# Patient Record
Sex: Female | Born: 1960
Health system: Southern US, Community
[De-identification: ages and names within clinical notes are randomized; demographics above are authoritative.]

## PROBLEM LIST (undated history)

## (undated) DIAGNOSIS — F32A Depression, unspecified: Secondary | ICD-10-CM

## (undated) DIAGNOSIS — Z9889 Other specified postprocedural states: Secondary | ICD-10-CM

## (undated) DIAGNOSIS — K5900 Constipation, unspecified: Secondary | ICD-10-CM

## (undated) DIAGNOSIS — Z8489 Family history of other specified conditions: Secondary | ICD-10-CM

## (undated) DIAGNOSIS — I25119 Atherosclerotic heart disease of native coronary artery with unspecified angina pectoris: Secondary | ICD-10-CM

## (undated) DIAGNOSIS — I6529 Occlusion and stenosis of unspecified carotid artery: Secondary | ICD-10-CM

## (undated) DIAGNOSIS — E7800 Pure hypercholesterolemia, unspecified: Secondary | ICD-10-CM

## (undated) DIAGNOSIS — I2510 Atherosclerotic heart disease of native coronary artery without angina pectoris: Secondary | ICD-10-CM

## (undated) DIAGNOSIS — Z87442 Personal history of urinary calculi: Secondary | ICD-10-CM

## (undated) DIAGNOSIS — R112 Nausea with vomiting, unspecified: Secondary | ICD-10-CM

## (undated) DIAGNOSIS — R319 Hematuria, unspecified: Secondary | ICD-10-CM

## (undated) DIAGNOSIS — G250 Essential tremor: Secondary | ICD-10-CM

## (undated) DIAGNOSIS — T8859XA Other complications of anesthesia, initial encounter: Secondary | ICD-10-CM

## (undated) DIAGNOSIS — F419 Anxiety disorder, unspecified: Secondary | ICD-10-CM

## (undated) DIAGNOSIS — I739 Peripheral vascular disease, unspecified: Secondary | ICD-10-CM

## (undated) DIAGNOSIS — F418 Other specified anxiety disorders: Secondary | ICD-10-CM

## (undated) DIAGNOSIS — R519 Headache, unspecified: Secondary | ICD-10-CM

## (undated) DIAGNOSIS — R634 Abnormal weight loss: Secondary | ICD-10-CM

## (undated) HISTORY — DX: Atherosclerotic heart disease of native coronary artery with unspecified angina pectoris: I25.119

## (undated) HISTORY — DX: Occlusion and stenosis of unspecified carotid artery: I65.29

## (undated) HISTORY — DX: Other specified anxiety disorders: F41.8

## (undated) HISTORY — DX: Constipation, unspecified: K59.00

## (undated) HISTORY — DX: Atherosclerotic heart disease of native coronary artery without angina pectoris: I25.10

## (undated) HISTORY — DX: Abnormal weight loss: R63.4

## (undated) HISTORY — DX: Essential tremor: G25.0

## (undated) HISTORY — PX: TONSILLECTOMY: SUR1361

## (undated) HISTORY — DX: Pure hypercholesterolemia, unspecified: E78.00

## (undated) HISTORY — DX: Hematuria, unspecified: R31.9

## (undated) HISTORY — DX: Anxiety disorder, unspecified: F41.9

## (undated) HISTORY — PX: COLONOSCOPY: SHX174

## (undated) HISTORY — DX: Peripheral vascular disease, unspecified: I73.9

## (undated) HISTORY — PX: OOPHORECTOMY: SHX86

---

## 1990-08-31 HISTORY — PX: TUBAL LIGATION: SHX77

## 2001-09-12 ENCOUNTER — Encounter: Admission: RE | Admit: 2001-09-12 | Discharge: 2001-09-12 | Payer: Self-pay

## 2001-09-28 ENCOUNTER — Encounter: Payer: Self-pay | Admitting: Vascular Surgery

## 2001-09-29 ENCOUNTER — Ambulatory Visit (HOSPITAL_COMMUNITY): Admission: RE | Admit: 2001-09-29 | Discharge: 2001-09-29 | Payer: Self-pay | Admitting: Vascular Surgery

## 2004-08-31 HISTORY — PX: ROTATOR CUFF REPAIR: SHX139

## 2011-01-16 NOTE — Cardiovascular Report (Signed)
[place]  Patient:    Alyssa Ritter, Alyssa Ritter Visit Number: [id number] MRN: [id number]          Service Type: DSU Location: [facility] 2899 [id number] Attending Physician:  Colvin Caroli Dictated by:   Caralee Ates, M.D. Proc. Date: 09/29/01 Admit Date:  09/29/2001 Discharge Date: 09/29/2001   CC:         CVTS Office  Peripheral Vascular Lab, 6th Floor   Cardiac Catheterization  PREOPERATIVE DIAGNOSIS:  Aortoiliac occlusive disease.  POSTOPERATIVE DIAGNOSIS:  Aortoiliac occlusive disease.  PROCEDURES: 1. Aortogram with bilateral lower extremity runoff. 2. Selective right iliac imaging. 3. Right common iliac PTA and stent placement (Genesis 6 x 18).  SURGEON:  Caralee Ates, M.D.  ANESTHESIA:  Fentanyl 100 mcg IV, versed 2 mg IV, and 1% lidocaine as local.  ACCESS:  Right common femoral artery (5-French sheath converted to 6-French sheath for peripheral intervention).  TOTAL FLUOROSCOPY TIME:  13 minutes, 32 seconds.  TOTAL CONTRAST:  235 cc of Visipaque.  BRIEF HISTORY:  This is a 50 year old white female with a history of peripheral vascular disease and a strong family history for peripheral artery occlusive disease who was evaluated by Dr. Hart Rochester for ongoing hip, thigh, and buttock claudication. She was found to have diminished palpable pulse particularly on the right side and appeared to have significant claudication symptoms. She was scheduled for an arteriogram with possible intervention.  DESCRIPTION OF PROCEDURE:  The patient was brought to the cath lab and placed on the cath lab table in supine position. Following adequate IV sedation, the groins were prepped and draped in a sterile fashion. The right common femoral artery was percutaneously punctured, following local anesthesia with 1% lidocaine. An .035 guidewire was passed into the infrarenal aorta. A 5-French sheath was placed. A pigtail catheter was then advanced into the aorta,  and serial images of the aorta were performed. Next, the catheter was pulled down to just above the bifurcation of the aorta, and runoff images of the lower extremities were obtained serially. Next, the pigtail catheter was removed over a guidewire, and the 5-French right common femoral sheath was changed to a 6-French sheath. A retrograde injection of contrast was performed from this side demonstrating a focal proximal right common iliac artery stenosis. A radiopaque ruler was placed on the anterior abdomen. The lesion was marked, and then a Genesis (6 x 18) stent was positioned at the level of the iliac stenosis. The stent was deployed without difficulty. Following deployment of the stent, a 2nd retrograde injection contrast was performed which demonstrated a widely patent iliac stent. Following this, the deployment apparatus was removed over a guidewire, then the guidewire and sheath were removed. Direct pressure was held at the puncture site until hemostasis was achieved.  ANGIOGRAPHIC FINDINGS: 1. A very small caliber infrarenal aorta with 2 areas of focal    shelf-like plaque, 1 just below the renals and 1 just above the    aortic bifurcation. There were single renal arteries bilaterally    with the right renal artery originating more inferiorly than    the left. Pressure gradient measurements performed in the infrarenal    aorta demonstrated an initial pressure gradient of approximately    20 mmHg which resolved in the distal aorta. 2. The right common iliac artery has a proximal 70% stenosis. The    remainder of the iliac arteries bilaterally were normal as were    the SFA profunda and all tibial vessels.  There was 3-vessel runoff    to the level of the foot. 3. Following deployment of the stent on the right common iliac, the    stent is widely patent. There was excellent flow across the stent.  RECOMMENDATIONS: 1. Routine post-catheterization care. 2. Plavix 75 mg p.o. q.d.  x1 month and then aspirin 81 mg p.o. q.d. 3. Will have the patient follow up in my office for ongoing    surveillance and determine if she has ongoing symptoms despite    our intervention. At some point, due to the small caliber of her    aorta and her family history of severe peripheral artery occlusive    disease as well as her ongoing smoking, she is at risk for distal    aortic occlusion which she should be followed for. Dictated by:   Caralee Ates, M.D. Attending Physician:  Colvin Caroli DD:  09/29/01 TD:  09/29/01 Job: [id number]

## 2020-07-29 DIAGNOSIS — E7800 Pure hypercholesterolemia, unspecified: Secondary | ICD-10-CM | POA: Insufficient documentation

## 2020-07-29 DIAGNOSIS — F419 Anxiety disorder, unspecified: Secondary | ICD-10-CM | POA: Insufficient documentation

## 2020-07-29 DIAGNOSIS — I739 Peripheral vascular disease, unspecified: Secondary | ICD-10-CM | POA: Insufficient documentation

## 2020-07-29 DIAGNOSIS — F32A Depression, unspecified: Secondary | ICD-10-CM | POA: Insufficient documentation

## 2020-07-29 DIAGNOSIS — G250 Essential tremor: Secondary | ICD-10-CM | POA: Insufficient documentation

## 2020-07-29 DIAGNOSIS — I25119 Atherosclerotic heart disease of native coronary artery with unspecified angina pectoris: Secondary | ICD-10-CM | POA: Insufficient documentation

## 2020-07-29 DIAGNOSIS — F418 Other specified anxiety disorders: Secondary | ICD-10-CM | POA: Insufficient documentation

## 2020-07-29 DIAGNOSIS — K5900 Constipation, unspecified: Secondary | ICD-10-CM | POA: Insufficient documentation

## 2020-07-29 DIAGNOSIS — R634 Abnormal weight loss: Secondary | ICD-10-CM | POA: Insufficient documentation

## 2020-07-29 DIAGNOSIS — R319 Hematuria, unspecified: Secondary | ICD-10-CM | POA: Insufficient documentation

## 2020-08-01 ENCOUNTER — Ambulatory Visit: Payer: 59 | Admitting: Cardiology

## 2020-08-01 ENCOUNTER — Telehealth: Payer: Self-pay | Admitting: Cardiology

## 2020-08-01 ENCOUNTER — Encounter: Payer: Self-pay | Admitting: Cardiology

## 2020-08-01 ENCOUNTER — Other Ambulatory Visit: Payer: Self-pay

## 2020-08-01 VITALS — BP 140/72 | HR 62 | Ht 63.0 in | Wt 145.0 lb

## 2020-08-01 DIAGNOSIS — I739 Peripheral vascular disease, unspecified: Secondary | ICD-10-CM

## 2020-08-01 DIAGNOSIS — R079 Chest pain, unspecified: Secondary | ICD-10-CM | POA: Diagnosis not present

## 2020-08-01 DIAGNOSIS — E7800 Pure hypercholesterolemia, unspecified: Secondary | ICD-10-CM

## 2020-08-01 DIAGNOSIS — I25118 Atherosclerotic heart disease of native coronary artery with other forms of angina pectoris: Secondary | ICD-10-CM | POA: Diagnosis not present

## 2020-08-01 LAB — BASIC METABOLIC PANEL
BUN/Creatinine Ratio: 11 (ref 9–23)
BUN: 10 mg/dL (ref 6–24)
CO2: 22 mmol/L (ref 20–29)
Calcium: 8.9 mg/dL (ref 8.7–10.2)
Chloride: 104 mmol/L (ref 96–106)
Creatinine, Ser: 0.91 mg/dL (ref 0.57–1.00)
GFR calc Af Amer: 81 mL/min/{1.73_m2} (ref >59)
GFR calc non Af Amer: 70 mL/min/{1.73_m2} (ref >59)
Glucose: 87 mg/dL (ref 65–99)
Potassium: 4 mmol/L (ref 3.5–5.2)
Sodium: 141 mmol/L (ref 134–144)

## 2020-08-01 MED ORDER — METOPROLOL TARTRATE 100 MG PO TABS: 100.0000 mg | ORAL_TABLET | Freq: Once | ORAL | 0 refills | Status: DC

## 2020-08-01 NOTE — Telephone Encounter (Signed)
Patient is returning call to schedule appointment with the lipid clinic

## 2020-08-01 NOTE — Progress Notes (Signed)
Cardiology Office Note:    Date:  08/01/2020   ID:  [name], [place] 08-09-1961, MRN [id number]  PCP:  Lonie Peak, PA-C  Cardiologist:  Norman Herrlich, MD   Referring MD: Lonie Peak, PA-C  ASSESSMENT:    1. Coronary artery disease of native artery of native heart with stable angina pectoris (HCC)   2. Pure hypercholesterolemia   3. Peripheral vascular disease of extremity (HCC)    PLAN:    In order of problems listed above:   1. She has a history of mild CAD and variable angina.  The recent episode was disturbing occurred at rest was prolonged and with her severe dyslipidemia is at risk for progression we decided to do facilitate a cardiac CTA.  From.  I did not put her on maintenance beta-blocker with relative bradycardia heart rate 62 bpm.  If flow-limiting stenosis would benefit from revascularization percutaneous or surgical. 2. Initiate lipid-lowering therapy PCSK9 inhibitor she is intolerant of all statins has familial hyperlipidemia 3. Stable after PCI remotely  Next appointment 6 weeks   Medication Adjustments/Labs and Tests Ordered: Current medicines are reviewed at length with the patient today.  Concerns regarding medicines are outlined above.  No orders of the defined types were placed in this encounter.  No orders of the defined types were placed in this encounter.    Chief Complaint  Patient presents with  . Coronary Artery Disease    History of Present Illness:    Alyssa Ritter is a 59 y.o. female who is being seen today to establish cardiology care at the request of Lonie Peak, [place]. Epic reveals that I had seen her 11/07/2013 her problems include hyperlipidemia with statin intolerance angina pectoris and coronary vasospasm.  She had coronary angiography performed with mild nonobstructive right coronary artery stenosis 2005 there is a notation she has a history of CAD and had a normal stress echo in 2014 and has peripheral vascular  disease with previous right iliac artery PCI and stent.  Recent labs from her PCP office: Cholesterol 317 LDL 236 triglycerides 125 HDL 58 CMP with GFR 82 cc creatinine 0.9 potassium 5.0 sodium 139 normal liver function test hemoglobin 14.7  There is an angiogram report from 09/29/2001 the patient had aortography and bilateral lower extremity runoff.  At that time  had right common iliac ETA and stent placement.  We both remember each other.  Rarely does she have chest pain.  About a month ago [place] the beach is a very peaceful day sitting resting and she had the onset of typical angina substernal pressure shortness of breath did not resolve with rest she had a difficult time getting to her car and when she got back to the home after aspirin and nitroglycerin had relief in 10 or 15 minutes.  Last episode like this was a year ago.  In general she is done well she does not have claudication and does not have exertional chest pain shortness of breath palpitation or syncope. Past Medical History:  Diagnosis Date  . Anxiety disorder   . Benign essential tremor   . CAD (coronary artery disease)   . Constipation   . Depression with anxiety   . Hematuria   . Hypercholesteremia   . Peripheral vascular disease of extremity (HCC)   . Weight loss, abnormal     Past Surgical History:  Procedure Laterality Date  . CESAREAN SECTION    . OOPHORECTOMY Bilateral    Right cyst  . ROTATOR  CUFF REPAIR Left 2006    Current Medications: Current Meds  Medication Sig  . ASPIRIN 81 PO Take 1 tablet by mouth daily.  . clonazePAM (KLONOPIN) 1 MG tablet Take 0.5-1 mg by mouth 2 (two) times daily as needed.  Marland Kitchen escitalopram (LEXAPRO) 20 MG tablet Take 20 mg by mouth daily.  . nitroGLYCERIN (NITROSTAT) 0.4 MG SL tablet Place 0.4 mg under the tongue every 5 (five) minutes as needed for chest pain.  . traZODone (DESYREL) 50 MG tablet Take 50 mg by mouth at bedtime.     Allergies:   Penicillins   Social  History   Socioeconomic History  . Marital status: Married    Spouse name: Not on file  . Number of children: Not on file  . Years of education: Not on file  . Highest education level: Not on file  Occupational History  . Not on file  Tobacco Use  . Smoking status: Former Smoker    Types: Cigarettes    Quit date: 01/2018    Years since quitting: 2.5  . Smokeless tobacco: Current User  Vaping Use  . Vaping Use: Former  . Quit date: 01/29/2018  Substance and Sexual Activity  . Alcohol use: Yes    Comment: occasionally  . Drug use: Never  . Sexual activity: Not on file  Other Topics Concern  . Not on file  Social History Narrative  . Not on file   Social Determinants of Health   Financial Resource Strain:   . Difficulty of Paying Living Expenses: Not on file  Food Insecurity:   . Worried About Programme researcher, broadcasting/film/video in the Last Year: Not on file  . Ran Out of Food in the Last Year: Not on file  Transportation Needs:   . Lack of Transportation (Medical): Not on file  . Lack of Transportation (Non-Medical): Not on file  Physical Activity:   . Days of Exercise per Week: Not on file  . Minutes of Exercise per Session: Not on file  Stress:   . Feeling of Stress : Not on file  Social Connections:   . Frequency of Communication with Friends and Family: Not on file  . Frequency of Social Gatherings with Friends and Family: Not on file  . Attends Religious Services: Not on file  . Active Member of Clubs or Organizations: Not on file  . Attends Banker Meetings: Not on file  . Marital Status: Not on file     Family History: The patient's family history includes Arthritis in her mother and sister; CAD in her father; Colon polyps in her mother; Hypercholesterolemia in her father and mother; Hypertension in her brother, father, and mother; Seizures in her mother; Stroke in her brother; Thyroid disease in her sister. There is no history of Colon cancer or Breast  cancer.  ROS:   ROS Please see the history of present illness.     All other systems reviewed and are negative.  EKGs/Labs/Other Studies Reviewed:    The following studies were reviewed today:   EKG:  EKG is sinus rhythm and is normal ordered today.  The ekg ordered today is personally reviewed and demonstrates sinus rhythm and normal   Physical Exam:    VS:  BP 140/72 (BP Location: Left Arm)   Pulse 62   Ht 5\' 3"  (1.6 m)   Wt 145 lb (65.8 kg)   SpO2 98%   BMI 25.69 kg/m     Wt Readings from Last 3  Encounters:  08/01/20 145 lb (65.8 kg)     GEN:  Well nourished, well developed in no acute distress there is no xanthoma or xanthelasma HEENT: Normal NECK: No JVD; No carotid bruits LYMPHATICS: No lymphadenopathy CARDIAC: RRR, no murmurs, rubs, gallops RESPIRATORY:  Clear to auscultation without rales, wheezing or rhonchi  ABDOMEN: Soft, non-tender, non-distended MUSCULOSKELETAL:  No edema; No deformity  SKIN: Warm and dry NEUROLOGIC:  Alert and oriented x 3 PSYCHIATRIC:  Normal affect     Signed, Norman Herrlich, MD  08/01/2020 10:12 AM    [facility]

## 2020-08-01 NOTE — Patient Instructions (Addendum)
Medication Instructions:  Your physician recommends that you continue on your current medications as directed. Please refer to the Current Medication list given to you today.  *If you need a refill on your cardiac medications before your next appointment, please call your pharmacy*   Lab Work: Your physician recommends that you return for lab work in: TODAY BMP If you have labs (blood work) drawn today and your tests are completely normal, you will receive your results only by: Marland Kitchen MyChart Message (if you have MyChart) OR . A paper copy in the mail If you have any lab test that is abnormal or we need to change your treatment, we will call you to review the results.   Testing/Procedures: Your cardiac CT will be scheduled at the below location:   [facility] [address] [place], [place] [address] [phone]   If scheduled at [facility], please arrive at the [facility] main entrance of [facility] 30 minutes prior to test start time. Proceed to the [facility] Radiology Department (first floor) to check-in and test prep.   Please follow these instructions carefully (unless otherwise directed):   On the Night Before the Test: . Be sure to Drink plenty of water. . Do not consume any caffeinated/decaffeinated beverages or chocolate 12 hours prior to your test. . Do not take any antihistamines 12 hours prior to your test.  On the Day of the Test: . Drink plenty of water. Do not drink any water within one hour of the test. . Do not eat any food 4 hours prior to the test. . You may take your regular medications prior to the test.  . Take metoprolol (Lopressor) two hours prior to test. . FEMALES- please wear underwire-free bra if available       After the Test: . Drink plenty of water. . After receiving IV contrast, you may experience a mild flushed feeling. This is normal. . On occasion, you may experience a mild rash up to 24 hours after the  test. This is not dangerous. If this occurs, you can take Benadryl 25 mg and increase your fluid intake. . If you experience trouble breathing, this can be serious. If it is severe call 911 IMMEDIATELY. If it is mild, please call our office. . If you take any of these medications: Glipizide/Metformin, Avandament, Glucavance, please do not take 48 hours after completing test unless otherwise instructed.   Once we have confirmed authorization from your insurance company, we will call you to set up a date and time for your test. Based on how quickly your insurance processes prior authorizations requests, please allow up to 4 weeks to be contacted for scheduling your Cardiac CT appointment. Be advised that routine Cardiac CT appointments could be scheduled as many as 8 weeks after your provider has ordered it.  For non-scheduling related questions, please contact the cardiac imaging nurse navigator should you have any questions/concerns: Marchia Bond, Cardiac Imaging Nurse Navigator Burley Saver, Interim Cardiac Imaging Nurse [place] [facility] Direct Office Dial: [phone]   For scheduling needs, including cancellations and rescheduling, please call [place], [phone].     Follow-Up: At [facility], you and your health needs are our priority.  As part of our continuing mission to provide you with exceptional heart care, we have created designated Provider Care Teams.  These Care Teams include your primary Cardiologist (physician) and Advanced Practice Providers (APPs -  Physician Assistants and Nurse Practitioners) who  all work together to provide you with the care you need, when you need it.  We recommend signing up for the patient portal called "MyChart".  Sign up information is provided on this After Visit Summary.  MyChart is used to connect with patients for Virtual Visits (Telemedicine).  Patients are able to view lab/test results, encounter notes,  upcoming appointments, etc.  Non-urgent messages can be sent to your provider as well.   To learn more about what you can do with MyChart, go to [email or web address].    Your next appointment:   6 week(s)  The format for your next appointment:   In Person  Provider:   Shirlee More, MD   Other Instructions

## 2020-08-01 NOTE — Telephone Encounter (Signed)
Patient called back to talk about the shot Dr. Dulce Sellar wants to do for chlorestorol. Please call back

## 2020-08-02 ENCOUNTER — Telehealth: Payer: Self-pay

## 2020-08-02 NOTE — Telephone Encounter (Signed)
Spoke with patient regarding results and recommendation.  Patient verbalizes understanding and is agreeable to plan of care. Advised patient to call back with any issues or concerns.  

## 2020-08-02 NOTE — Telephone Encounter (Signed)
-----   Message from Baldo Daub, MD sent at 08/02/2020 12:37 PM EST ----- Normal result for cardiac CTA

## 2020-08-06 NOTE — Patient Instructions (Addendum)
It was great meeting you today!  We would like to lower your LDL (bad cholesterol) to less than 70  We are going to start you on a medication called Repatha, which is an injection you will give yourself once every 2 weeks  We would like to recheck your cholesterol levels in about 2-3 months  Laural Golden, PharmD, BCACP, CDCES, CPP [facility] [address], [place], [place] [address] Phone: [phone]; Fax: [phone] 08/07/2020 10:45 AM

## 2020-08-07 ENCOUNTER — Other Ambulatory Visit: Payer: Self-pay

## 2020-08-07 ENCOUNTER — Encounter: Payer: Self-pay | Admitting: Pharmacist

## 2020-08-07 ENCOUNTER — Telehealth: Payer: Self-pay | Admitting: Pharmacist

## 2020-08-07 ENCOUNTER — Ambulatory Visit (INDEPENDENT_AMBULATORY_CARE_PROVIDER_SITE_OTHER): Payer: 59 | Admitting: Pharmacist

## 2020-08-07 DIAGNOSIS — I2510 Atherosclerotic heart disease of native coronary artery without angina pectoris: Secondary | ICD-10-CM | POA: Diagnosis not present

## 2020-08-07 DIAGNOSIS — E7800 Pure hypercholesterolemia, unspecified: Secondary | ICD-10-CM | POA: Diagnosis not present

## 2020-08-07 MED ORDER — EVOLOCUMAB 140 MG/ML ~~LOC~~ SOAJ: 1.0000 mL | SUBCUTANEOUS | 1 refills | Status: DC

## 2020-08-07 NOTE — Telephone Encounter (Signed)
PA for Repatha approved through 08/07/2021.  Called patient and she is aware

## 2020-08-07 NOTE — Progress Notes (Signed)
Patient ID: Alyssa Ritter                 DOB: 02/25/1961                    MRN: [id number]     HPI: Alyssa Ritter is a 59 y.o. female patient referred to lipid clinic by Dr Dulce Sellar. PMH is significant for PVD, CAD, angina and HLD.  Seen By Dr Dulce Sellar on 08/01/20 after PCP referral due to chest pain at the beach while at rest.  SOB and angina did not resolve until she had aspirin and nitrogluycerin.  Intolerant to statins due to renal issues.  Patient presents to day in good spirits.  Reports she had her cholesterol controlled ~10 years ago with diet and exercise.  Lives with husband and 2 grandchildren, age 110 and 6.  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  Had to homeschool them and cook meals during Covid and was no longer physically active which is why she believes her cholesterol has increased.  Current Medications: n/a Intolerances: zocor, crestor, lipitor Risk Factors: CAD, HLD LDL goal: <70  Exercise: Walks, although says that since the pandemic and having to home school her grandchildren, she was much less active in the past year  Labs: TC 317, HDL 58, LDL 236, Trigs 125 (07/09/20 - not on any meds)  Past Medical History:  Diagnosis Date  . Anxiety disorder   . Benign essential tremor   . CAD (coronary artery disease)   . Constipation   . Depression with anxiety   . Hematuria   . Hypercholesteremia   . Peripheral vascular disease of extremity (HCC)   . Weight loss, abnormal     Current Outpatient Medications on File Prior to Visit  Medication Sig Dispense Refill  . ASPIRIN 81 PO Take 1 tablet by mouth daily.    . clonazePAM (KLONOPIN) 1 MG tablet Take 0.5-1 mg by mouth 2 (two) times daily as needed.    Marland Kitchen escitalopram (LEXAPRO) 20 MG tablet Take 20 mg by mouth daily.    . metoprolol tartrate (LOPRESSOR) 100 MG tablet Take 1 tablet (100 mg total) by mouth once for 1 dose. Take two hours prior to your CT 1 tablet 0  . nitroGLYCERIN (NITROSTAT) 0.4 MG SL  tablet Place 0.4 mg under the tongue every 5 (five) minutes as needed for chest pain.    . traZODone (DESYREL) 50 MG tablet Take 50 mg by mouth at bedtime.     No current facility-administered medications on file prior to visit.    Allergies  Allergen Reactions  . Penicillins Anaphylaxis    Assessment/Plan:  1. Hyperlipidemia - Patients calculated LDL per lab 236 which is above goal of <70.  Since patient is intolerant of statins due to kidney injury, next step for LDL reduction would be a PCSK9i.  Patient is willing to try.  Using [employer], educated patient on storage, site selection and administration.  Patient was able to demonstrate use of demo pen in room.  Will complete PA and set up patient for lipid panel in 2-3 months.  Patient voiced understanding.  Recheck as needed.  Start Repatha 140mg  SQ q 14 d  , PharmD, BCACP, CDCES, CPP [facility] [address], [place], [place] Phone: [phone]; Fax: [phone] 08/07/2020 1:28 PM

## 2020-08-21 ENCOUNTER — Telehealth (HOSPITAL_COMMUNITY): Payer: Self-pay | Admitting: *Deleted

## 2020-08-21 NOTE — Telephone Encounter (Signed)
Reaching out to patient to offer assistance regarding upcoming cardiac imaging study; pt verbalizes understanding of appt date/time, parking situation and where to check in, pre-test NPO status and medications ordered, and verified current allergies; name and call back number provided for further questions should they arise ° °Aariona Momon Tai RN Navigator Cardiac Imaging °[facility] °[phone] office °[phone] cell ° °

## 2020-08-26 ENCOUNTER — Telehealth: Payer: Self-pay

## 2020-08-26 ENCOUNTER — Other Ambulatory Visit: Payer: Self-pay

## 2020-08-26 ENCOUNTER — Ambulatory Visit (HOSPITAL_COMMUNITY)
Admission: RE | Admit: 2020-08-26 | Discharge: 2020-08-26 | Disposition: A | Discharge status: Home / Self Care | Payer: 59 | Source: Ambulatory Visit | Attending: Cardiology | Admitting: Cardiology

## 2020-08-26 DIAGNOSIS — I2510 Atherosclerotic heart disease of native coronary artery without angina pectoris: Secondary | ICD-10-CM | POA: Diagnosis not present

## 2020-08-26 DIAGNOSIS — R079 Chest pain, unspecified: Secondary | ICD-10-CM | POA: Diagnosis not present

## 2020-08-26 MED ORDER — NITROGLYCERIN 0.4 MG SL SUBL
0.8000 mg | SUBLINGUAL_TABLET | Freq: Once | SUBLINGUAL | Status: AC
  Administered 2020-08-26: 0.8 mg via SUBLINGUAL

## 2020-08-26 MED ORDER — NITROGLYCERIN 0.4 MG SL SUBL
SUBLINGUAL_TABLET | SUBLINGUAL | Status: AC
  Filled 2020-08-26: qty 2

## 2020-08-26 MED ORDER — IOHEXOL 350 MG/ML SOLN
80.0000 mL | Freq: Once | INTRAVENOUS | Status: AC | PRN
  Administered 2020-08-26: 80 mL via INTRAVENOUS

## 2020-08-26 NOTE — Telephone Encounter (Signed)
Spoke with patient regarding results and recommendation.  Patient verbalizes understanding and is agreeable to plan of care. Advised patient to call back with any issues or concerns.  

## 2020-08-28 DIAGNOSIS — I2510 Atherosclerotic heart disease of native coronary artery without angina pectoris: Secondary | ICD-10-CM | POA: Diagnosis not present

## 2020-08-30 NOTE — Progress Notes (Signed)
Cardiology Office Note:    Date:  09/02/2020   ID:  Minetta, Krisher [birth year], MRN [id number]  PCP:  Lonie Peak, PA-C  Cardiologist:  Norman Herrlich, MD    Referring MD: Lonie Peak, PA-C    ASSESSMENT:    1. Coronary artery disease involving native heart, unspecified vessel or lesion type, unspecified whether angina present   2. Hypercholesteremia   3. Peripheral vascular disease of extremity (HCC)    PLAN:    In order of problems listed above:  1. Progressive coronary disease flow-limiting stenosis right coronary artery and at least moderate left main stenosis undergo coronary angiography hopefully percutaneous intervention.  She will continue medical therapy including aspirin beta-blocker and lipid-lowering with Repatha. 2. Stable PAD   Next appointment: 6 weeks   Medication Adjustments/Labs and Tests Ordered: Current medicines are reviewed at length with the patient today.  Concerns regarding medicines are outlined above.  Orders Placed This Encounter  Procedures  . Basic metabolic panel  . CBC  . EKG 12-Lead   No orders of the defined types were placed in this encounter.   No chief complaint on file.   History of Present Illness:    Alyssa Ritter is a 59 y.o. female with a hx of hyperlipidemia with statin intolerance history of angina felt to be due to coronary vasospasm with previous angiography in 2005 showing mild nonobstructive right coronary artery stenosis.  She was last seen 08/01/2020 change in her anginal pattern with a prolonged episode of nocturnal chest pain..  She also has a history of peripheral arterial disease with previous right CIA stent.  Her baseline LDL severely elevated 236 consistent with familial hyperlipidemia she is statin intolerant and was referred to lipid clinic and initiated on PCSK9 therapy with Repatha.  Compliance with diet, lifestyle and medications: Yes  Since her last visit she has done well tolerates  Repatha has had no further chest pain.  Reviewed the results of her cardiac CTA showing significant stenosis of right coronary artery and at least moderate left main coronary stenosis reviewed the benefits risk options and she opts to undergo coronary angiography.  Cardiac CTA reported 08/01/2020 shows a calcium score of 278 which was 98th percentile for age and sex matched control.  She had mild to moderate atherosclerosis the left main coronary artery was not adequately visualized due to motion with report of at least 25 to 49% stenosis.  The LAD stenosis 25 to 49% mid vessel and first diagonal branch.  Left circumflex 1 to 24% stenosis obtuse marginal 1 and right coronary artery showed stenosis in the proximal and distal right coronary artery as high as 50 to 69% stenosis in the mid vessel.  Squint fractional flow reserve was normal involving the left main 0.89 LAD 0.87 left circumflex 0.86 however managed in the mid right coronary artery 0.68 consistent with significant stenosis.  Past Medical History:  Diagnosis Date  . Anxiety disorder   . Benign essential tremor   . CAD (coronary artery disease)   . Constipation   . Depression with anxiety   . Hematuria   . Hypercholesteremia   . Peripheral vascular disease of extremity (HCC)   . Weight loss, abnormal     Past Surgical History:  Procedure Laterality Date  . CESAREAN SECTION    . OOPHORECTOMY Bilateral    Right cyst  . ROTATOR CUFF REPAIR Left 2006    Current Medications: Current Meds  Medication Sig  . ASPIRIN 81 PO Take  1 tablet by mouth daily.  . clonazePAM (KLONOPIN) 1 MG tablet Take 0.5-1 mg by mouth 2 (two) times daily as needed.  Marland Kitchen escitalopram (LEXAPRO) 20 MG tablet Take 20 mg by mouth daily.  . Evolocumab 140 MG/ML SOAJ Inject 1 mL into the skin every 14 (fourteen) days.  . nitroGLYCERIN (NITROSTAT) 0.4 MG SL tablet Place 0.4 mg under the tongue every 5 (five) minutes as needed for chest pain.  . traZODone (DESYREL) 50  MG tablet Take 50 mg by mouth at bedtime.     Allergies:   Penicillins and Statins   Social History   Socioeconomic History  . Marital status: Married    Spouse name: Not on file  . Number of children: Not on file  . Years of education: Not on file  . Highest education level: Not on file  Occupational History  . Not on file  Tobacco Use  . Smoking status: Former Smoker    Types: Cigarettes    Quit date: 01/2018    Years since quitting: 2.5  . Smokeless tobacco: Current User  Vaping Use  . Vaping Use: Former  . Quit date: 01/29/2018  Substance and Sexual Activity  . Alcohol use: Yes    Comment: occasionally  . Drug use: Never  . Sexual activity: Not on file  Other Topics Concern  . Not on file  Social History Narrative  . Not on file   Social Determinants of Health   Financial Resource Strain: Not on file  Food Insecurity: Not on file  Transportation Needs: Not on file  Physical Activity: Not on file  Stress: Not on file  Social Connections: Not on file     Family History: The patient's family history includes Arthritis in her mother and sister; CAD in her father; Colon polyps in her mother; Hypercholesterolemia in her father and mother; Hypertension in her brother, father, and mother; Seizures in her mother; Stroke in her brother; Thyroid disease in her sister. There is no history of Colon cancer or Breast cancer. ROS:   Please see the history of present illness.    All other systems reviewed and are negative.  EKGs/Labs/Other Studies Reviewed:    The following studies were reviewed today:  Recent Labs: 08/01/2020: BUN 10; Creatinine, Ser 0.91; Potassium 4.0; Sodium 141  Recent Lipid Panel No results found for: CHOL, TRIG, HDL, CHOLHDL, VLDL, LDLCALC, LDLDIRECT  Physical Exam:    VS:  BP (!) 146/74   Pulse 60   Ht 5\' 3"  (1.6 m)   Wt 141 lb 1.3 oz (64 kg)   SpO2 98%   BMI 24.99 kg/m     Wt Readings from Last 3 Encounters:  09/02/20 141 lb 1.3 oz (64  kg)  08/01/20 145 lb (65.8 kg)     GEN:  Well nourished, well developed in no acute distress HEENT: Normal NECK: No JVD; No carotid bruits LYMPHATICS: No lymphadenopathy CARDIAC: RRR, no murmurs, rubs, gallops RESPIRATORY:  Clear to auscultation without rales, wheezing or rhonchi  ABDOMEN: Soft, non-tender, non-distended MUSCULOSKELETAL:  No edema; No deformity  SKIN: Warm and dry NEUROLOGIC:  Alert and oriented x 3 PSYCHIATRIC:  Normal affect    Signed, 14/02/21, MD  09/02/2020 2:52 PM    [facility]

## 2020-08-30 NOTE — H&P (View-Only) (Signed)
Cardiology Office Note:    Date:  09/02/2020   ID:  Alyssa Ritter, Alyssa Ritter [birth year], MRN [id number]  PCP:  Lonie Peak, PA-C  Cardiologist:  Norman Herrlich, MD    Referring MD: Lonie Peak, PA-C    ASSESSMENT:    1. Coronary artery disease involving native heart, unspecified vessel or lesion type, unspecified whether angina present   2. Hypercholesteremia   3. Peripheral vascular disease of extremity (HCC)    PLAN:    In order of problems listed above:  1. Progressive coronary disease flow-limiting stenosis right coronary artery and at least moderate left main stenosis undergo coronary angiography hopefully percutaneous intervention.  She will continue medical therapy including aspirin beta-blocker and lipid-lowering with Repatha. 2. Stable PAD   Next appointment: 6 weeks   Medication Adjustments/Labs and Tests Ordered: Current medicines are reviewed at length with the patient today.  Concerns regarding medicines are outlined above.  Orders Placed This Encounter  Procedures  . Basic metabolic panel  . CBC  . EKG 12-Lead   No orders of the defined types were placed in this encounter.   No chief complaint on file.   History of Present Illness:    Alyssa Ritter is a 59 y.o. female with a hx of hyperlipidemia with statin intolerance history of angina felt to be due to coronary vasospasm with previous angiography in 2005 showing mild nonobstructive right coronary artery stenosis.  She was last seen 08/01/2020 change in her anginal pattern with a prolonged episode of nocturnal chest pain..  She also has a history of peripheral arterial disease with previous right CIA stent.  Her baseline LDL severely elevated 236 consistent with familial hyperlipidemia she is statin intolerant and was referred to lipid clinic and initiated on PCSK9 therapy with Repatha.  Compliance with diet, lifestyle and medications: Yes  Since her last visit she has done well tolerates  Repatha has had no further chest pain.  Reviewed the results of her cardiac CTA showing significant stenosis of right coronary artery and at least moderate left main coronary stenosis reviewed the benefits risk options and she opts to undergo coronary angiography.  Cardiac CTA reported 08/01/2020 shows a calcium score of 278 which was 98th percentile for age and sex matched control.  She had mild to moderate atherosclerosis the left main coronary artery was not adequately visualized due to motion with report of at least 25 to 49% stenosis.  The LAD stenosis 25 to 49% mid vessel and first diagonal branch.  Left circumflex 1 to 24% stenosis obtuse marginal 1 and right coronary artery showed stenosis in the proximal and distal right coronary artery as high as 50 to 69% stenosis in the mid vessel.  Squint fractional flow reserve was normal involving the left main 0.89 LAD 0.87 left circumflex 0.86 however managed in the mid right coronary artery 0.68 consistent with significant stenosis.  Past Medical History:  Diagnosis Date  . Anxiety disorder   . Benign essential tremor   . CAD (coronary artery disease)   . Constipation   . Depression with anxiety   . Hematuria   . Hypercholesteremia   . Peripheral vascular disease of extremity (HCC)   . Weight loss, abnormal     Past Surgical History:  Procedure Laterality Date  . CESAREAN SECTION    . OOPHORECTOMY Bilateral    Right cyst  . ROTATOR CUFF REPAIR Left 2006    Current Medications: Current Meds  Medication Sig  . ASPIRIN 81 PO Take  1 tablet by mouth daily.  . clonazePAM (KLONOPIN) 1 MG tablet Take 0.5-1 mg by mouth 2 (two) times daily as needed.  . escitalopram (LEXAPRO) 20 MG tablet Take 20 mg by mouth daily.  . Evolocumab 140 MG/ML SOAJ Inject 1 mL into the skin every 14 (fourteen) days.  . nitroGLYCERIN (NITROSTAT) 0.4 MG SL tablet Place 0.4 mg under the tongue every 5 (five) minutes as needed for chest pain.  . traZODone (DESYREL) 50  MG tablet Take 50 mg by mouth at bedtime.     Allergies:   Penicillins and Statins   Social History   Socioeconomic History  . Marital status: Married    Spouse name: Not on file  . Number of children: Not on file  . Years of education: Not on file  . Highest education level: Not on file  Occupational History  . Not on file  Tobacco Use  . Smoking status: Former Smoker    Types: Cigarettes    Quit date: 01/2018    Years since quitting: 2.5  . Smokeless tobacco: Current User  Vaping Use  . Vaping Use: Former  . Quit date: 01/29/2018  Substance and Sexual Activity  . Alcohol use: Yes    Comment: occasionally  . Drug use: Never  . Sexual activity: Not on file  Other Topics Concern  . Not on file  Social History Narrative  . Not on file   Social Determinants of Health   Financial Resource Strain: Not on file  Food Insecurity: Not on file  Transportation Needs: Not on file  Physical Activity: Not on file  Stress: Not on file  Social Connections: Not on file     Family History: The patient's family history includes Arthritis in her mother and sister; CAD in her father; Colon polyps in her mother; Hypercholesterolemia in her father and mother; Hypertension in her brother, father, and mother; Seizures in her mother; Stroke in her brother; Thyroid disease in her sister. There is no history of Colon cancer or Breast cancer. ROS:   Please see the history of present illness.    All other systems reviewed and are negative.  EKGs/Labs/Other Studies Reviewed:    The following studies were reviewed today:  Recent Labs: 08/01/2020: BUN 10; Creatinine, Ser 0.91; Potassium 4.0; Sodium 141  Recent Lipid Panel No results found for: CHOL, TRIG, HDL, CHOLHDL, VLDL, LDLCALC, LDLDIRECT  Physical Exam:    VS:  BP (!) 146/74   Pulse 60   Ht 5' 3" (1.6 m)   Wt 141 lb 1.3 oz (64 kg)   SpO2 98%   BMI 24.99 kg/m     Wt Readings from Last 3 Encounters:  09/02/20 141 lb 1.3 oz (64  kg)  08/01/20 145 lb (65.8 kg)     GEN:  Well nourished, well developed in no acute distress HEENT: Normal NECK: No JVD; No carotid bruits LYMPHATICS: No lymphadenopathy CARDIAC: RRR, no murmurs, rubs, gallops RESPIRATORY:  Clear to auscultation without rales, wheezing or rhonchi  ABDOMEN: Soft, non-tender, non-distended MUSCULOSKELETAL:  No edema; No deformity  SKIN: Warm and dry NEUROLOGIC:  Alert and oriented x 3 PSYCHIATRIC:  Normal affect    Signed, Cyriah Childrey, MD  09/02/2020 2:52 PM    [facility]  

## 2020-09-02 ENCOUNTER — Encounter: Payer: Self-pay | Admitting: Cardiology

## 2020-09-02 ENCOUNTER — Ambulatory Visit: Payer: BC Managed Care – PPO | Admitting: Cardiology

## 2020-09-02 ENCOUNTER — Other Ambulatory Visit: Payer: Self-pay

## 2020-09-02 VITALS — BP 146/74 | HR 60 | Ht 63.0 in | Wt 141.1 lb

## 2020-09-02 DIAGNOSIS — I739 Peripheral vascular disease, unspecified: Secondary | ICD-10-CM | POA: Diagnosis not present

## 2020-09-02 DIAGNOSIS — E7800 Pure hypercholesterolemia, unspecified: Secondary | ICD-10-CM | POA: Diagnosis not present

## 2020-09-02 DIAGNOSIS — I2510 Atherosclerotic heart disease of native coronary artery without angina pectoris: Secondary | ICD-10-CM | POA: Diagnosis not present

## 2020-09-02 MED ORDER — NITROGLYCERIN 0.4 MG SL SUBL: 0.4000 mg | SUBLINGUAL_TABLET | SUBLINGUAL | 0 refills | Status: AC | PRN

## 2020-09-02 NOTE — Patient Instructions (Signed)
Medication Instructions:  Your physician has recommended you make the following change in your medication:  START: Nitroglycerin 0.4 mg take one tablet by mouth every 5 minutes as needed for chest pain up to three times.  *If you need a refill on your cardiac medications before your next appointment, please call your pharmacy*   Lab Work: Your physician recommends that you return for lab work in: TODAY CBC, BMP If you have labs (blood work) drawn today and your tests are completely normal, you will receive your results only by: Marland Kitchen MyChart Message (if you have MyChart) OR . A paper copy in the mail If you have any lab test that is abnormal or we need to change your treatment, we will call you to review the results.   Testing/Procedures:    [facility] CARDIOVASCULAR DIVISION [facility] [place] [address], [address] [place] [address] Dept: [phone] Loc: [phone]  Evian Derringer  09/02/2020  You are scheduled for a Cardiac Catheterization on Friday, January 7 with Dr. Bryan Lemma.  1. Please arrive at the [facility] (Main Entrance A) at [facility]: [address] [place], [place] [address] at 5:30 AM (This time is two hours before your procedure to ensure your preparation). Free valet parking service is available.   Special note: Every effort is made to have your procedure done on time. Please understand that emergencies sometimes delay scheduled procedures.  2. Diet: Do not eat solid foods after midnight.  The patient may have clear liquids until 5am upon the day of the procedure.  3. Labs: You will need to have blood drawn on YOU HAD YOUR LABS DRAWN TODAY  4. Medication instructions in preparation for your procedure:   Contrast Allergy: No   On the morning of your procedure, take your Aspirin and any morning medicines NOT listed above.  You may use sips of water.  5. Plan for one night stay--bring  personal belongings. 6. Bring a current list of your medications and current insurance cards. 7. You MUST have a responsible person to drive you home. 8. Someone MUST be with you the first 24 hours after you arrive home or your discharge will be delayed. 9. Please wear clothes that are easy to get on and off and wear slip-on shoes.  Thank you for allowing [place] to care for you!   --  Invasive Cardiovascular services    Follow-Up: At [facility], you and your health needs are our priority.  As part of our continuing mission to provide you with exceptional heart care, we have created designated Provider Care Teams.  These Care Teams include your primary Cardiologist (physician) and Advanced Practice Providers (APPs -  Physician Assistants and Nurse Practitioners) who all work together to provide you with the care you need, when you need it.  We recommend signing up for the patient portal called "MyChart".  Sign up information is provided on this After Visit Summary.  MyChart is used to connect with patients for Virtual Visits (Telemedicine).  Patients are able to view lab/test results, encounter notes, upcoming appointments, etc.  Non-urgent messages can be sent to your provider as well.   To learn more about what you can do with MyChart, go to [email or web address].    Your next appointment:   6 week(s)  The format for your next appointment:   In Person  Provider:   Norman Herrlich, MD   Other Instructions

## 2020-09-03 ENCOUNTER — Telehealth: Payer: Self-pay

## 2020-09-03 LAB — CBC
Hematocrit: 43.5 % (ref 34.0–46.6)
Hemoglobin: 14.4 g/dL (ref 11.1–15.9)
MCH: 28.8 pg (ref 26.6–33.0)
MCHC: 33.1 g/dL (ref 31.5–35.7)
MCV: 87 fL (ref 79–97)
Platelets: 273 10*3/uL (ref 150–450)
RBC: 5 x10E6/uL (ref 3.77–5.28)
RDW: 13.8 % (ref 11.7–15.4)
WBC: 7.9 10*3/uL (ref 3.4–10.8)

## 2020-09-03 LAB — BASIC METABOLIC PANEL
BUN/Creatinine Ratio: 13 (ref 9–23)
BUN: 11 mg/dL (ref 6–24)
CO2: 22 mmol/L (ref 20–29)
Calcium: 9.4 mg/dL (ref 8.7–10.2)
Chloride: 105 mmol/L (ref 96–106)
Creatinine, Ser: 0.85 mg/dL (ref 0.57–1.00)
GFR calc Af Amer: 88 mL/min/{1.73_m2} (ref >59)
GFR calc non Af Amer: 76 mL/min/{1.73_m2} (ref >59)
Glucose: 86 mg/dL (ref 65–99)
Potassium: 3.9 mmol/L (ref 3.5–5.2)
Sodium: 142 mmol/L (ref 134–144)

## 2020-09-03 NOTE — Telephone Encounter (Signed)
-----   Message from Baldo Daub, MD sent at 09/03/2020  7:56 AM EST ----- Normal result

## 2020-09-03 NOTE — Telephone Encounter (Signed)
Spoke with patient regarding results and recommendation.  Patient verbalizes understanding and is agreeable to plan of care. Advised patient to call back with any issues or concerns.  

## 2020-09-04 ENCOUNTER — Other Ambulatory Visit (HOSPITAL_COMMUNITY)
Admission: RE | Admit: 2020-09-04 | Discharge: 2020-09-04 | Disposition: A | Discharge status: Home / Self Care | Payer: BC Managed Care – PPO | Source: Ambulatory Visit | Attending: Cardiology | Admitting: Cardiology

## 2020-09-04 DIAGNOSIS — Z880 Allergy status to penicillin: Secondary | ICD-10-CM | POA: Diagnosis not present

## 2020-09-04 DIAGNOSIS — Z01812 Encounter for preprocedural laboratory examination: Secondary | ICD-10-CM | POA: Insufficient documentation

## 2020-09-04 DIAGNOSIS — I739 Peripheral vascular disease, unspecified: Secondary | ICD-10-CM | POA: Diagnosis not present

## 2020-09-04 DIAGNOSIS — I25118 Atherosclerotic heart disease of native coronary artery with other forms of angina pectoris: Secondary | ICD-10-CM | POA: Diagnosis not present

## 2020-09-04 DIAGNOSIS — Z20822 Contact with and (suspected) exposure to covid-19: Secondary | ICD-10-CM | POA: Insufficient documentation

## 2020-09-04 DIAGNOSIS — Z7982 Long term (current) use of aspirin: Secondary | ICD-10-CM | POA: Diagnosis not present

## 2020-09-04 DIAGNOSIS — Z79899 Other long term (current) drug therapy: Secondary | ICD-10-CM | POA: Diagnosis not present

## 2020-09-04 DIAGNOSIS — Z87891 Personal history of nicotine dependence: Secondary | ICD-10-CM | POA: Diagnosis not present

## 2020-09-04 DIAGNOSIS — Z888 Allergy status to other drugs, medicaments and biological substances status: Secondary | ICD-10-CM | POA: Diagnosis not present

## 2020-09-04 DIAGNOSIS — E7800 Pure hypercholesterolemia, unspecified: Secondary | ICD-10-CM | POA: Diagnosis not present

## 2020-09-04 LAB — SARS CORONAVIRUS 2 (TAT 6-24 HRS): SARS Coronavirus 2: NEGATIVE

## 2020-09-05 ENCOUNTER — Telehealth: Payer: Self-pay | Admitting: *Deleted

## 2020-09-05 NOTE — Telephone Encounter (Signed)
Pt contacted pre-catheterization scheduled at [facility] for: Friday September 06, 2020 7:30 AM Verified arrival time and place: [facility] Main Entrance A [facility]) at: 5:30 AM   No solid food after midnight prior to cath, clear liquids until 5 AM day of procedure.   AM meds can be  taken pre-cath with sips of water including: ASA 81 mg   Confirmed patient has responsible adult to drive home post procedure and be with patient first 24 hours after arriving home: yes  You are allowed ONE visitor in the waiting room during the time you are at the hospital for your procedure. Both you and your visitor must wear a mask once you enter the hospital.    Reviewed procedure/mask/visitor instructions with patient.

## 2020-09-06 ENCOUNTER — Encounter (HOSPITAL_COMMUNITY)
Admission: RE | Disposition: A | Discharge status: Home / Self Care | Payer: Self-pay | Source: Home / Self Care | Attending: Cardiology

## 2020-09-06 ENCOUNTER — Other Ambulatory Visit: Payer: Self-pay

## 2020-09-06 ENCOUNTER — Ambulatory Visit (HOSPITAL_COMMUNITY)
Admission: RE | Admit: 2020-09-06 | Discharge: 2020-09-06 | Disposition: A | Discharge status: Home / Self Care | Payer: BC Managed Care – PPO | Attending: Cardiology | Admitting: Cardiology

## 2020-09-06 ENCOUNTER — Other Ambulatory Visit (HOSPITAL_COMMUNITY): Payer: Self-pay | Admitting: Cardiology

## 2020-09-06 DIAGNOSIS — R931 Abnormal findings on diagnostic imaging of heart and coronary circulation: Secondary | ICD-10-CM | POA: Diagnosis present

## 2020-09-06 DIAGNOSIS — I739 Peripheral vascular disease, unspecified: Secondary | ICD-10-CM | POA: Diagnosis not present

## 2020-09-06 DIAGNOSIS — I200 Unstable angina: Secondary | ICD-10-CM | POA: Diagnosis present

## 2020-09-06 DIAGNOSIS — I25119 Atherosclerotic heart disease of native coronary artery with unspecified angina pectoris: Secondary | ICD-10-CM | POA: Diagnosis present

## 2020-09-06 DIAGNOSIS — I25118 Atherosclerotic heart disease of native coronary artery with other forms of angina pectoris: Secondary | ICD-10-CM | POA: Insufficient documentation

## 2020-09-06 DIAGNOSIS — Z888 Allergy status to other drugs, medicaments and biological substances status: Secondary | ICD-10-CM | POA: Insufficient documentation

## 2020-09-06 DIAGNOSIS — Z20822 Contact with and (suspected) exposure to covid-19: Secondary | ICD-10-CM | POA: Insufficient documentation

## 2020-09-06 DIAGNOSIS — Z7982 Long term (current) use of aspirin: Secondary | ICD-10-CM | POA: Insufficient documentation

## 2020-09-06 DIAGNOSIS — Z955 Presence of coronary angioplasty implant and graft: Secondary | ICD-10-CM

## 2020-09-06 DIAGNOSIS — Z880 Allergy status to penicillin: Secondary | ICD-10-CM | POA: Diagnosis not present

## 2020-09-06 DIAGNOSIS — E7800 Pure hypercholesterolemia, unspecified: Secondary | ICD-10-CM | POA: Insufficient documentation

## 2020-09-06 DIAGNOSIS — Z79899 Other long term (current) drug therapy: Secondary | ICD-10-CM | POA: Diagnosis not present

## 2020-09-06 DIAGNOSIS — Z87891 Personal history of nicotine dependence: Secondary | ICD-10-CM | POA: Diagnosis not present

## 2020-09-06 DIAGNOSIS — I2510 Atherosclerotic heart disease of native coronary artery without angina pectoris: Secondary | ICD-10-CM

## 2020-09-06 HISTORY — PX: CORONARY STENT INTERVENTION: CATH118234

## 2020-09-06 HISTORY — DX: Unstable angina: I20.0

## 2020-09-06 HISTORY — PX: LEFT HEART CATH AND CORONARY ANGIOGRAPHY: CATH118249

## 2020-09-06 HISTORY — DX: Abnormal findings on diagnostic imaging of heart and coronary circulation: R93.1

## 2020-09-06 LAB — POCT ACTIVATED CLOTTING TIME
Activated Clotting Time: 279 seconds
Activated Clotting Time: 327 seconds

## 2020-09-06 SURGERY — LEFT HEART CATH AND CORONARY ANGIOGRAPHY
Anesthesia: LOCAL

## 2020-09-06 MED ORDER — HYDRALAZINE HCL 20 MG/ML IJ SOLN: 10.0000 mg | INTRAMUSCULAR | Status: DC | PRN

## 2020-09-06 MED ORDER — FENTANYL CITRATE (PF) 100 MCG/2ML IJ SOLN
INTRAMUSCULAR | Status: AC
  Filled 2020-09-06: qty 2

## 2020-09-06 MED ORDER — ASPIRIN 81 MG PO CHEW: 81.0000 mg | CHEWABLE_TABLET | ORAL | Status: AC

## 2020-09-06 MED ORDER — LIDOCAINE HCL (PF) 1 % IJ SOLN
INTRAMUSCULAR | Status: AC
  Filled 2020-09-06: qty 30

## 2020-09-06 MED ORDER — LABETALOL HCL 5 MG/ML IV SOLN: 10.0000 mg | INTRAVENOUS | Status: DC | PRN

## 2020-09-06 MED ORDER — FAMOTIDINE IN NACL 20-0.9 MG/50ML-% IV SOLN
INTRAVENOUS | Status: AC
  Filled 2020-09-06: qty 50

## 2020-09-06 MED ORDER — ACETAMINOPHEN 325 MG PO TABS: 650.0000 mg | ORAL_TABLET | ORAL | Status: DC | PRN

## 2020-09-06 MED ORDER — SODIUM CHLORIDE 0.9% FLUSH: 3.0000 mL | INTRAVENOUS | Status: DC | PRN

## 2020-09-06 MED ORDER — NITROGLYCERIN 1 MG/10 ML FOR IR/CATH LAB
INTRA_ARTERIAL | Status: AC
  Filled 2020-09-06: qty 10

## 2020-09-06 MED ORDER — SODIUM CHLORIDE 0.9 % IV SOLN: INTRAVENOUS | Status: AC

## 2020-09-06 MED ORDER — HEPARIN (PORCINE) IN NACL 1000-0.9 UT/500ML-% IV SOLN
INTRAVENOUS | Status: AC
  Filled 2020-09-06: qty 1000

## 2020-09-06 MED ORDER — MIDAZOLAM HCL 2 MG/2ML IJ SOLN
INTRAMUSCULAR | Status: DC | PRN
  Administered 2020-09-06: 2 mg via INTRAVENOUS

## 2020-09-06 MED ORDER — SODIUM CHLORIDE 0.9 % IV SOLN: 250.0000 mL | INTRAVENOUS | Status: DC | PRN

## 2020-09-06 MED ORDER — VERAPAMIL HCL 2.5 MG/ML IV SOLN
INTRAVENOUS | Status: AC
  Filled 2020-09-06: qty 2

## 2020-09-06 MED ORDER — SODIUM CHLORIDE 0.9 % WEIGHT BASED INFUSION: 1.0000 mL/kg/h | INTRAVENOUS | Status: DC

## 2020-09-06 MED ORDER — CLOPIDOGREL BISULFATE 300 MG PO TABS
ORAL_TABLET | ORAL | Status: AC
  Filled 2020-09-06: qty 2

## 2020-09-06 MED ORDER — ONDANSETRON HCL 4 MG/2ML IJ SOLN: 4.0000 mg | Freq: Four times a day (QID) | INTRAMUSCULAR | Status: DC | PRN

## 2020-09-06 MED ORDER — LIDOCAINE HCL (PF) 1 % IJ SOLN
INTRAMUSCULAR | Status: DC | PRN
  Administered 2020-09-06: 2 mL

## 2020-09-06 MED ORDER — HEPARIN (PORCINE) IN NACL 1000-0.9 UT/500ML-% IV SOLN
INTRAVENOUS | Status: DC | PRN
  Administered 2020-09-06 (×2): 500 mL

## 2020-09-06 MED ORDER — PANTOPRAZOLE SODIUM 40 MG PO TBEC: 40.0000 mg | DELAYED_RELEASE_TABLET | Freq: Every day | ORAL | 1 refills | Status: DC

## 2020-09-06 MED ORDER — CLOPIDOGREL BISULFATE 75 MG PO TABS: 75.0000 mg | ORAL_TABLET | Freq: Every day | ORAL | Status: DC

## 2020-09-06 MED ORDER — CARVEDILOL 3.125 MG PO TABS: 3.1250 mg | ORAL_TABLET | Freq: Two times a day (BID) | ORAL | 1 refills | Status: DC

## 2020-09-06 MED ORDER — CLOPIDOGREL BISULFATE 300 MG PO TABS
ORAL_TABLET | ORAL | Status: DC | PRN
  Administered 2020-09-06: 600 mg via ORAL

## 2020-09-06 MED ORDER — IOHEXOL 350 MG/ML SOLN
INTRAVENOUS | Status: DC | PRN
  Administered 2020-09-06: 205 mL

## 2020-09-06 MED ORDER — SODIUM CHLORIDE 0.9% FLUSH: 3.0000 mL | Freq: Two times a day (BID) | INTRAVENOUS | Status: DC

## 2020-09-06 MED ORDER — VERAPAMIL HCL 2.5 MG/ML IV SOLN
INTRAVENOUS | Status: DC | PRN
  Administered 2020-09-06: 10 mL via INTRA_ARTERIAL

## 2020-09-06 MED ORDER — NITROGLYCERIN 1 MG/10 ML FOR IR/CATH LAB
INTRA_ARTERIAL | Status: DC | PRN
  Administered 2020-09-06 (×3): 200 ug via INTRACORONARY

## 2020-09-06 MED ORDER — FAMOTIDINE IN NACL 20-0.9 MG/50ML-% IV SOLN
INTRAVENOUS | Status: AC | PRN
  Administered 2020-09-06: 20 mg via INTRAVENOUS

## 2020-09-06 MED ORDER — HEPARIN SODIUM (PORCINE) 1000 UNIT/ML IJ SOLN
INTRAMUSCULAR | Status: DC | PRN
  Administered 2020-09-06: 3000 [IU] via INTRAVENOUS
  Administered 2020-09-06: 3500 [IU] via INTRAVENOUS

## 2020-09-06 MED ORDER — MIDAZOLAM HCL 2 MG/2ML IJ SOLN
INTRAMUSCULAR | Status: AC
  Filled 2020-09-06: qty 2

## 2020-09-06 MED ORDER — CLOPIDOGREL BISULFATE 75 MG PO TABS: 75.0000 mg | ORAL_TABLET | Freq: Every day | ORAL | 1 refills | Status: DC

## 2020-09-06 MED ORDER — SODIUM CHLORIDE 0.9 % WEIGHT BASED INFUSION
3.0000 mL/kg/h | INTRAVENOUS | Status: AC
  Administered 2020-09-06: 3 mL/kg/h via INTRAVENOUS

## 2020-09-06 MED ORDER — HEPARIN SODIUM (PORCINE) 1000 UNIT/ML IJ SOLN
INTRAMUSCULAR | Status: AC
  Filled 2020-09-06: qty 1

## 2020-09-06 MED ORDER — MORPHINE SULFATE (PF) 2 MG/ML IV SOLN: 2.0000 mg | INTRAVENOUS | Status: DC | PRN

## 2020-09-06 MED ORDER — FENTANYL CITRATE (PF) 100 MCG/2ML IJ SOLN
INTRAMUSCULAR | Status: DC | PRN
  Administered 2020-09-06: 25 ug via INTRAVENOUS

## 2020-09-06 MED FILL — CLOPIDOGREL 75 MG TABLET: 75 | 90 days supply | Qty: 90 | Fill #0

## 2020-09-06 MED FILL — PANTOPRAZOLE SOD DR 40 MG T: 40 | 30 days supply | Qty: 30 | Fill #0

## 2020-09-06 MED FILL — CARVEDILOL 3.125 MG TABLET: 3.125 | 30 days supply | Qty: 60 | Fill #0

## 2020-09-06 SURGICAL SUPPLY — 25 items
BALLN SAPPHIRE 1.5X12 (BALLOONS) ×2
BALLN SAPPHIRE 2.0X15 (BALLOONS) ×2
BALLN SAPPHIRE ~~LOC~~ 2.25X15 (BALLOONS) ×1 IMPLANT
BALLOON SAPPHIRE 1.5X12 (BALLOONS) IMPLANT
BALLOON SAPPHIRE 2.0X15 (BALLOONS) IMPLANT
CATH INFINITI 5 FR JL3.5 (CATHETERS) ×1 IMPLANT
CATH INFINITI 5FR ANG PIGTAIL (CATHETERS) ×1 IMPLANT
CATH LAUNCHER 5F EBU3.5 (CATHETERS) ×1 IMPLANT
CATH LAUNCHER 5F JR4 (CATHETERS) ×2 IMPLANT
CATH OPTITORQUE TIG 4.0 5F (CATHETERS) ×2 IMPLANT
DEVICE RAD COMP TR BAND LRG (VASCULAR PRODUCTS) ×1 IMPLANT
GLIDESHEATH SLEND SS 6F .021 (SHEATH) ×1 IMPLANT
GUIDEWIRE INQWIRE 1.5J.035X260 (WIRE) IMPLANT
GUIDEWIRE PRESSURE COMET II (WIRE) ×1 IMPLANT
INQWIRE 1.5J .035X260CM (WIRE) ×2
KIT ENCORE 26 ADVANTAGE (KITS) ×1 IMPLANT
KIT ESSENTIALS PG (KITS) ×1 IMPLANT
KIT HEART LEFT (KITS) ×2 IMPLANT
PACK CARDIAC CATHETERIZATION (CUSTOM PROCEDURE TRAY) ×2 IMPLANT
SHEATH PROBE COVER 6X72 (BAG) ×1 IMPLANT
STENT RESOLUTE ONYX 2.0X22 (Permanent Stent) ×1 IMPLANT
STENT RESOLUTE ONYX 2.25X8 (Permanent Stent) ×1 IMPLANT
SYR MEDRAD MARK 7 150ML (SYRINGE) ×2 IMPLANT
TRANSDUCER W/STOPCOCK (MISCELLANEOUS) ×2 IMPLANT
TUBING CIL FLEX 10 FLL-RA (TUBING) ×2 IMPLANT

## 2020-09-06 NOTE — Discharge Summary (Signed)
Discharge Summary for Same Day PCI   Patient ID: Alyssa Ritter MRN: [id number]; DOB: 1961-07-19  Admit date: 09/06/2020 Discharge date: 09/06/2020  Primary Care Provider: Lonie Peak, PA-C  Primary Cardiologist: Norman Herrlich, MD  Primary Electrophysiologist:  None   Discharge Diagnoses    Principal Problem:   Abnormal cardiac CT angiography Active Problems:   Coronary artery disease involving native coronary artery of native heart with angina pectoris [facility])   Progressive angina [facility])    Diagnostic Studies/Procedures    Cardiac Catheterization 09/06/2020:   CULPRIT LESION: Prox RCA lesion is 80% stenosed.  A drug-eluting stent was successfully placed using a STENT RESOLUTE ONYX 2.0X22. Postdilated 2.3 mm  Post intervention, there is a 0% residual stenosis.  Ost RCA lesion is 50% stenosed -noted to be significant after initial stent placement  A drug-eluting stent was successfully placed from the ostium overlapping previous stent, using a STENT RESOLUTE ONYX 2.25X8. Postdilated to 2.4 mm  Post intervention, there is a 0% residual stenosis.  -----------------  -----------------  Suezanne Jacquet LM to Mid LM lesion is 40% stenosed.  The left ventricular systolic function is normal. The left ventricular ejection fraction is 55-65% by visual estimate.  LV end diastolic pressure is normal.   SUMMARY  Severe single-vessel disease involving the ostial and proximal RCA (50% - 80% stenoses -> positive by DFR = 0.86-0.88) ? Progress successful DES PCI of the ostial-proximal RCA using 2 overlapping Resolute Onyx DES Stents (2.0 mm x 22 mm, 2.25 mm x 8 mm -> postdilated to 2.5-2.6 mm).  Moderate 40% proximal Left Main at a bend.  Otherwise relatively small caliber left system.  Preserved EF of 55% with normal LVEDP.   RECOMMENDATIONS  Okay for standing discharge  Continue Repatha, will add low-dose carvedilol  Continue risk factor modification  Follow-up with Dr.  Almedia Balls, MD  Diagnostic Dominance: Right    Intervention     _____________   History of Present Illness     Alyssa Ritter is a 60 y.o. female with PMH of HLD with statin intolerance, tremor, PAD who was seen in the office on 09/02/20. She history of angina felt to be due to coronary vasospasm with previous angiography in 2005 showing mild nonobstructive right coronary artery stenosis.  She was last seen 08/01/2020 and noted a change in her anginal pattern with a prolonged episode of nocturnal chest pain. She also has a history of peripheral arterial disease with previous right CIA stent.  Her baseline LDL severely elevated 236 consistent with familial hyperlipidemia she is statin intolerant and was referred to lipid clinic and initiated on PCSK9 therapy with Repatha. Had a cardiac CTA reported 08/01/2020 that showed a calcium score of 278 which was 98th percentile for age and sex matched control.  She had mild to moderate atherosclerosis the left main coronary artery was not adequately visualized due to motion with report of at least 25 to 49% stenosis.  The LAD stenosis 25 to 49% mid vessel and first diagonal branch.  Left circumflex 1 to 24% stenosis obtuse marginal 1 and right coronary artery showed stenosis in the proximal and distal right coronary artery as high as 50 to 69% stenosis in the mid vessel. Fractional flow reserve was normal involving the left main 0.89 LAD 0.87 left circumflex 0.86 however managed in the mid right coronary artery 0.68 consistent with significant stenosis. Cardiac catheterization was arranged for further evaluation.  Hospital Course     The patient  underwent cardiac cath as noted above with severe single vessel disease involving ostial and pRCA which was +DFR 0.86-0.88 with successful PCI/DESx2 overlapping stents. Plan for DAPT with ASA/Plavix for at least 6 months. The patient was seen by cardiac rehab while in short stay. There were  no observed complications post cath. Radial cath site was re-evaluated prior to discharge and found to be stable without any complications. Instructions/precautions regarding cath site care were given prior to discharge. Also added low dose coreg 3.125mg  BID at discharge.   Shirleen Mcfaul was seen by Dr. Herbie Baltimore and determined stable for discharge home. Follow up with our office has been arranged. Medications are listed below. Pertinent changes include addition of plavix and coreg. Given Rx for protonix as well as she reported GERD with taking plavix in the past.   _____________  Cath/PCI Registry Performance & Quality Measures: 1. Aspirin prescribed? - Yes 2. ADP Receptor Inhibitor (Plavix/Clopidogrel, Brilinta/Ticagrelor or Effient/Prasugrel) prescribed (includes medically managed patients)? - Yes 3. High Intensity Statin (Lipitor 40-80mg  or Crestor 20-40mg ) prescribed? - Yes 4. For EF <40%, was ACEI/ARB prescribed? - Not Applicable (EF >/= 40%) 5. For EF <40%, Aldosterone Antagonist (Spironolactone or Eplerenone) prescribed? - Not Applicable (EF >/= 40%) 6. Cardiac Rehab Phase II ordered (Included Medically managed Patients)? - Yes  _____________   Discharge Vitals Blood pressure (!) 131/59, pulse (!) 55, temperature 98.3 F (36.8 C), temperature source Oral, resp. rate 16, height 5\' 3"  (1.6 m), weight 63.5 kg, SpO2 100 %.  Filed Weights   09/06/20 0534  Weight: 63.5 kg    Last Labs & Radiologic Studies    CBC No results for input(s): WBC, NEUTROABS, HGB, HCT, MCV, PLT in the last 72 hours. Basic Metabolic Panel No results for input(s): NA, K, CL, CO2, GLUCOSE, BUN, CREATININE, CALCIUM, MG, PHOS in the last 72 hours. Liver Function Tests No results for input(s): AST, ALT, ALKPHOS, BILITOT, PROT, ALBUMIN in the last 72 hours. No results for input(s): LIPASE, AMYLASE in the last 72 hours. High Sensitivity Troponin:   No results for input(s): TROPONINIHS in the last 720  hours.  BNP Invalid input(s): POCBNP D-Dimer No results for input(s): DDIMER in the last 72 hours. Hemoglobin A1C No results for input(s): HGBA1C in the last 72 hours. Fasting Lipid Panel No results for input(s): CHOL, HDL, LDLCALC, TRIG, CHOLHDL, LDLDIRECT in the last 72 hours. Thyroid Function Tests No results for input(s): TSH, T4TOTAL, T3FREE, THYROIDAB in the last 72 hours.  Invalid input(s): FREET3 _____________  CARDIAC CATHETERIZATION  Result Date: 09/06/2020  CULPRIT LESION: Prox RCA lesion is 80% stenosed.  A drug-eluting stent was successfully placed using a STENT RESOLUTE ONYX 2.0X22. Postdilated 2.3 mm  Post intervention, there is a 0% residual stenosis.  Ost RCA lesion is 50% stenosed -noted to be significant after initial stent placement  A drug-eluting stent was successfully placed from the ostium overlapping previous stent, using a STENT RESOLUTE ONYX 2.25X8. Postdilated to 2.4 mm  Post intervention, there is a 0% residual stenosis.  -----------------  -----------------  11/04/2020 LM to Mid LM lesion is 40% stenosed.  The left ventricular systolic function is normal. The left ventricular ejection fraction is 55-65% by visual estimate.  LV end diastolic pressure is normal.  SUMMARY  Severe single-vessel disease involving the ostial and proximal RCA (50% - 80% stenoses -> positive by DFR = 0.86-0.88)  Progress successful DES PCI of the ostial-proximal RCA using 2 overlapping Resolute Onyx DES Stents (2.0 mm x 22 mm,  2.25 mm x 8 mm -> postdilated to 2.5-2.6 mm).  Moderate 40% proximal Left Main at a bend.  Otherwise relatively small caliber left system.  Preserved EF of 55% with normal LVEDP. RECOMMENDATIONS  Okay for standing discharge  Continue Repatha, will add low-dose carvedilol  Continue risk factor modification  Follow-up with Dr. Almedia Balls, MD  CT CORONARY [facility] W/CTA COR W/SCORE [facility] W/CM &/OR WO/CM  Addendum Date: 08/26/2020   ADDENDUM REPORT:  08/26/2020 13:23 EXAM: Cardiac/Coronary  CT TECHNIQUE: The patient was scanned on a [employer]. FINDINGS: A 120 kV prospective scan was triggered in the descending thoracic aorta at 111 HU's. Axial non-contrast 3 mm slices were carried out through the heart. The data set was analyzed on a dedicated work station and scored using the Agatson method. Gantry rotation speed was 250 msecs and collimation was .6 mm. No beta blockade and 0.8 mg of sl NTG was given. The 3D data set was reconstructed in 5% intervals of the 67-82 % of the R-R cycle. Diastolic phases were analyzed on a dedicated work station using MPR, MIP and VRT modes. The patient received 80 cc of contrast. Quality: Good images but study limited by Noise and breathing artifact Aorta: Normal size. Scattered calcifications of the ascending and descending aorta and aortic arch. No dissection. Aortic Valve:  Trileaflet.  No calcifications. Coronary Arteries:  Normal coronary origin.  Right dominance. RCA is a large dominant artery that gives rise to PDA and PLVB. The RCA is diffusely diseased with mild to moderate calcified plaque in the proximal, mid and distal RCA with associated stenosis of 25-49% in the proximal and distal RCA and possibly as high as 50-69% in the mid RCA. Left main is a large artery that gives rise to LAD, Ramus and LCX arteries. There is mixed plaque throughout the LM with associated stenosis of 25-49% but could be as high as 50%. There is motion artifact that limits accurate assessment. LAD is a large vessel that gives rise to a large D1. There is minimal calcified plaque in the proximal LAD with associated stenosis of 1-24%. There is mild calcified plaque in the mid LAD after the takeoff of D1 with associated stenosis of 25-49% followed non calcified plaque with 25-49% stenosis. There is mild calcified plaque in the proximal and mid D1 with associated stenosis of 25-49%. LCX is a non-dominant artery that gives rise to one  large OM1 branch. There is minimal calcified plaque in the mid LCx at the takeoff of the OM1 with associated stenosis of 1-24%. Other findings: Normal pulmonary vein drainage into the left atrium. Normal let atrial appendage without a thrombus. Normal size of the pulmonary artery. IMPRESSION: 1. Coronary calcium score of 278. This was 98th percentile for age and sex matched control. 2.  Normal coronary origin with right dominance. 3. Mild to moderate atherosclerosis. CAD RADs 2. The LM is not adequately visualized due to motion artifact. There is at least 25-49% stenosis but may be higher. 4. Atherosclerosis of the ascending and descending aorta and aortic root. 5. Recommend Preventive therapy and aggressive risk factor modification. 6. Consider nuclear stress testing vs. Repeat coronary CTA to assess left main further. 7.  This study has been submitted for FFR flow analysis. Armanda Magic Electronically Signed   By: Armanda Magic   On: 08/26/2020 13:23   Result Date: 08/26/2020 EXAM: OVER-READ INTERPRETATION  CT CHEST The following report is an over-read performed by radiologist Dr. Charlett Nose of [facility] on 08/26/2020. This over-read does not include interpretation of cardiac or coronary anatomy or pathology. The coronary CTA interpretation by the cardiologist is attached. COMPARISON:  None. FINDINGS: Vascular: Heart is normal size. Aorta normal caliber. Scattered aortic calcifications. Mediastinum/Nodes: No adenopathy Lungs/Pleura: No confluent opacities or effusions. Upper Abdomen: Imaging into the upper abdomen demonstrates no acute findings. Musculoskeletal: Chest wall soft tissues are unremarkable. No acute bony abnormality. IMPRESSION: Aortic atherosclerosis. No acute extra cardiac abnormality. Electronically Signed: By: Charlett Nose M.D. On: 08/26/2020 10:33   CT CORONARY FRACTIONAL FLOW RESERVE DATA PREP  Result Date: 08/28/2020 EXAM: FFRCT ANALYSIS FINDINGS: FFRct analysis was  performed on the original cardiac CT angiogram dataset. Diagrammatic representation of the FFRct analysis is provided in a separate PDF document in PACS. This dictation was created using the PDF document and an interactive 3D model of the results. 3D model is not available in the EMR/PACS. Normal FFR range is >0.80. 1. Left Main: No significant stenosis. LM FFR = 0.89 2. LAD: No significant focal stenosis. Proximal FFR = 0.87, Mid FFR = 0.82, Distal FFR = 0.72. 3. LCX: No significant stenosis. Proximal FFR = 0.86, MId FFR = 0.85, Distal FFR could not be calculated. 4. RCA: Possible significant stenosis in proximal RCA. Proximal FFR = 0. 96, Mid FFR = 0.68, Distal FFR = 0.55. IMPRESSION: 1. Coronary CTA FFR flow analysis demonstrates possible hemodynamically flow limiting lesion in the proximal RCA. The proximal FFR drops from 0.96 to 0.68 before the mid RCA consistent with significant stenosis. There is gradual decrease in flow in the mid and distal LAD consistent with tapering of vessel and no focal stenosis. 2.  Recommend Cardiac Catheterization. Armanda Magic Electronically Signed   By: Armanda Magic   On: 08/28/2020 20:50    Disposition   Pt is being discharged home today in good condition.  Follow-up Plans & Appointments     Follow-up Information    Baldo Daub, MD Follow up on 09/20/2020.   Specialties: Cardiology, Radiology Why: at 10am for your follow up appt.  Contact information: [address] [place] [address] [phone]               Discharge Medications   Allergies as of 09/06/2020      Reactions   Penicillins Anaphylaxis   Statins    Kidney Injury      Medication List    STOP taking these medications   metoprolol tartrate 100 MG tablet Commonly known as: LOPRESSOR     TAKE these medications   aspirin EC 81 MG tablet Take 81 mg by mouth at bedtime. Swallow whole.   carvedilol 3.125 MG tablet Commonly known as: Coreg Take 1 tablet (3.125 mg total) by  mouth 2 (two) times daily with a meal.   clonazePAM 1 MG tablet Commonly known as: KLONOPIN Take 0.5-1 mg by mouth 2 (two) times daily as needed for anxiety.   clopidogrel 75 MG tablet Commonly known as: Plavix Take 1 tablet (75 mg total) by mouth daily.   escitalopram 20 MG tablet Commonly known as: LEXAPRO Take 20 mg by mouth at bedtime.   Evolocumab 140 MG/ML Soaj Inject 1 mL into the skin every 14 (fourteen) days.   nitroGLYCERIN 0.4 MG SL tablet Commonly known as: NITROSTAT Place 1 tablet (0.4 mg total) under the tongue every 5 (five) minutes as needed for chest pain.   pantoprazole 40 MG tablet Commonly known as: Protonix Take 1 tablet (40 mg total) by  mouth daily.   traZODone 50 MG tablet Commonly known as: DESYREL Take 50 mg by mouth at bedtime.          Allergies Allergies  Allergen Reactions  . Penicillins Anaphylaxis  . Statins     Kidney Injury    Outstanding Labs/Studies   N/a   Duration of Discharge Encounter   Greater than 30 minutes including physician time.  Signed, Reino Bellis, NP 09/06/2020, 11:03 AM

## 2020-09-06 NOTE — Discharge Instructions (Signed)
Radial Site Care Drink plenty of fluids for 24 hours Keep Right arm above heart level for 24 hours   This sheet gives you information about how to care for yourself after your procedure. Your health care provider may also give you more specific instructions. If you have problems or questions, contact your health care provider. What can I expect after the procedure? After the procedure, it is common to have:  Bruising and tenderness at the catheter insertion area. Follow these instructions at home: Medicines  Take over-the-counter and prescription medicines only as told by your health care provider. Insertion site care  Follow instructions from your health care provider about how to take care of your insertion site. Make sure you: ? Wash your hands with soap and water before you change your bandage (dressing). If soap and water are not available, use hand sanitizer. ? Change your dressing as told by your health care provider. ? Leave stitches (sutures), skin glue, or adhesive strips in place. These skin closures may need to stay in place for 2 weeks or longer. If adhesive strip edges start to loosen and curl up, you may trim the loose edges. Do not remove adhesive strips completely unless your health care provider tells you to do that.  Check your insertion site every day for signs of infection. Check for: ? Redness, swelling, or pain. ? Fluid or blood. ? Pus or a bad smell. ? Warmth.  Do not take baths, swim, or use a hot tub until your health care provider approves.  You may shower 24-48 hours after the procedure, or as directed by your health care provider. ? Remove the dressing and gently wash the site with plain soap and water. ? Pat the area dry with a clean towel. ? Do not rub the site. That could cause bleeding.  Do not apply powder or lotion to the site. Activity   For 24 hours after the procedure, or as directed by your health care provider: ? Do not flex or bend the  affected arm. ? Do not push or pull heavy objects with the affected arm. ? Do not drive yourself home from the hospital or clinic. You may drive 24 hours after the procedure unless your health care provider tells you not to. ? Do not operate machinery or power tools.  Do not lift anything that is heavier than 5 lb , or the limit that you are told, until your health care provider says that it is safe. For 1 Week.  Ask your health care provider when it is okay to: ? Return to work or school. ? Resume usual physical activities or sports. ? Resume sexual activity. General instructions  If the catheter site starts to bleed, raise your arm and put firm pressure on the site. If the bleeding does not stop, get help right away. This is a medical emergency.  If you went home on the same day as your procedure, a responsible adult should be with you for the first 24 hours after you arrive home.  Keep all follow-up visits as told by your health care provider. This is important. Contact a health care provider if:  You have a fever.  You have redness, swelling, or yellow drainage around your insertion site. Get help right away if:  You have unusual pain at the radial site.  The catheter insertion area swells very fast.  The insertion area is bleeding, and the bleeding does not stop when you hold steady pressure on the  area.  Your arm or hand becomes pale, cool, tingly, or numb. These symptoms may represent a serious problem that is an emergency. Do not wait to see if the symptoms will go away. Get medical help right away. Call your local emergency services (911 in the [place] Do not drive yourself to the hospital. Summary  After the procedure, it is common to have bruising and tenderness at the site.  Follow instructions from your health care provider about how to take care of your radial site wound. Check the wound every day for signs of infection.  Do not lift anything that is heavier than  10 lb (4.5 kg), or the limit that you are told, until your health care provider says that it is safe. This information is not intended to replace advice given to you by your health care provider. Make sure you discuss any questions you have with your health care provider. Document Revised: 09/22/2017 Document Reviewed: 09/22/2017 Elsevier Patient Education  2020 [employer].

## 2020-09-06 NOTE — Interval H&P Note (Signed)
History and Physical Interval Note:  09/06/2020 7:35 AM  Galvin Proffer  has presented today for surgery, with the diagnosis of cad - Abnormal Cardiac CTA-FFR of RCA.  The various methods of treatment have been discussed with the patient and family. After consideration of risks, benefits and other options for treatment, the patient has consented to  Procedure(s): LEFT HEART CATH AND CORONARY ANGIOGRAPHY (N/A)  PERCUTANEOUS CORONARY INTERVENTION  as a surgical intervention.  The patient's history has been reviewed, patient examined, no change in status, stable for surgery.  I have reviewed the patient's chart and labs.  Questions were answered to the patient's satisfaction.    Cath Lab Visit (complete for each Cath Lab visit)  Clinical Evaluation Leading to the Procedure:   ACS: No.  Non-ACS:    Anginal Classification: CCS IV  Anti-ischemic medical therapy: Minimal Therapy (1 class of medications)  Non-Invasive Test Results: High-risk stress test findings: cardiac mortality >3%/year  Prior CABG: No previous CABG   Bryan Lemma

## 2020-09-06 NOTE — Progress Notes (Addendum)
Pt awoke but trouble remembering names at times. ? still groggy from cath. Discussed stents, restrictions, Plavix, diet, exercise, NTG and CRPII. Pt quit tobacco products 2-3 years ago. Reinforced continued cessation. She is working on her diet and trying to lose the weight she gained with quarantine. Understands the importance of Plavix. Will refer to [facility].  [phone] Ethelda Chick CES, ACSM 11:32 AM 09/06/2020

## 2020-09-09 ENCOUNTER — Encounter (HOSPITAL_COMMUNITY): Payer: Self-pay | Admitting: Cardiology

## 2020-09-09 ENCOUNTER — Telehealth (HOSPITAL_COMMUNITY): Payer: Self-pay

## 2020-09-09 NOTE — Telephone Encounter (Signed)
Cardiac rehab referral for Ph.II faxed to [place]. 

## 2020-09-11 DIAGNOSIS — I2510 Atherosclerotic heart disease of native coronary artery without angina pectoris: Secondary | ICD-10-CM | POA: Insufficient documentation

## 2020-09-17 ENCOUNTER — Ambulatory Visit: Payer: 59 | Admitting: Cardiology

## 2020-09-20 ENCOUNTER — Ambulatory Visit: Payer: BC Managed Care – PPO | Admitting: Cardiology

## 2020-09-24 ENCOUNTER — Other Ambulatory Visit: Payer: Self-pay

## 2020-09-24 DIAGNOSIS — I2510 Atherosclerotic heart disease of native coronary artery without angina pectoris: Secondary | ICD-10-CM

## 2020-09-24 DIAGNOSIS — E7800 Pure hypercholesterolemia, unspecified: Secondary | ICD-10-CM

## 2020-09-24 MED ORDER — EVOLOCUMAB 140 MG/ML ~~LOC~~ SOAJ: 1.0000 mL | SUBCUTANEOUS | 3 refills | Status: AC

## 2020-09-24 NOTE — Telephone Encounter (Signed)
Refill of Repatha sent to [facility].

## 2020-09-25 ENCOUNTER — Telehealth: Payer: Self-pay

## 2020-09-25 NOTE — Telephone Encounter (Signed)
Called and spoke w/pt rgarding the insurance issue and I explained to them that it looks as if they have health insurance only and not drug so I instructed them to call their insurance company and then call us back and let us know what they say. Pt voiced understanding

## 2020-09-25 NOTE — Telephone Encounter (Signed)
-----   Message from Awilda Metro, RPH-CPP sent at 09/25/2020 12:01 PM EST ----- Oh I see the health only by her name, that is weird. Would probably need to call her to clarify ----- Message ----- From: Eather Colas, CMA Sent: 09/25/2020  10:42 AM EST To: Emeline Darling Supple, RPH-CPP  I tried to do pa said no eligibility found called insurance and they had same card. But if you look on the card stated health only does that mean drugs not covered? ----- Message ----- From: Awilda Metro, RPH-CPP Sent: 09/25/2020   9:51 AM EST To: Eather Colas, CMA  Hey, we got a message from this pt's [facility] that Repatha needs a PA. I see an approval on our spreadsheet so I'm not sure if the pt got new insurance. Key is [id number].  Thanks, [employer]

## 2020-09-26 NOTE — Telephone Encounter (Signed)
Prior authorization submitted via fax.

## 2020-09-27 ENCOUNTER — Telehealth: Payer: Self-pay

## 2020-09-27 NOTE — Telephone Encounter (Signed)
Capesi with [employer] with the [facility] [phone] called to ask if we have heard back re: the prior auth for Repatha... I advised her that it was sent via fax yesterday and we will let her know as soon as we hear back from the Ins Co.

## 2020-09-30 NOTE — Telephone Encounter (Signed)
Renea Ee from [facility] called to check the status of the prior authorization for the repatha.  Reference # [id number]

## 2020-09-30 NOTE — Telephone Encounter (Signed)
megan supple sent this in via fax for me because I am not in the office so I will route to her.

## 2020-09-30 NOTE — Telephone Encounter (Signed)
Re faxed pa today

## 2020-09-30 NOTE — Telephone Encounter (Signed)
Delorise Shiner from [facility] following up, states that [employer] has not received a fax yet. You can contact the insurance company at [phone], press option 3 then option 3 again. I asked for a fax number but she was unable to give that to me.

## 2020-09-30 NOTE — Telephone Encounter (Signed)
Received fax from [facility] of  stating pt repatha was no continuation of therapy and we needed to fill out initial coverage from. Form filled out and faxed back

## 2020-10-01 ENCOUNTER — Telehealth: Payer: Self-pay | Admitting: Cardiology

## 2020-10-01 NOTE — Telephone Encounter (Signed)
[facility] made aware that PA was approved.

## 2020-10-03 ENCOUNTER — Other Ambulatory Visit: Payer: Self-pay

## 2020-10-03 ENCOUNTER — Encounter: Payer: Self-pay | Admitting: Cardiology

## 2020-10-03 ENCOUNTER — Ambulatory Visit: Payer: BC Managed Care – PPO | Admitting: Cardiology

## 2020-10-03 VITALS — BP 140/70 | HR 58 | Ht 63.0 in | Wt 143.0 lb

## 2020-10-03 DIAGNOSIS — E7800 Pure hypercholesterolemia, unspecified: Secondary | ICD-10-CM

## 2020-10-03 DIAGNOSIS — R5381 Other malaise: Secondary | ICD-10-CM

## 2020-10-03 DIAGNOSIS — I2510 Atherosclerotic heart disease of native coronary artery without angina pectoris: Secondary | ICD-10-CM | POA: Diagnosis not present

## 2020-10-03 MED ORDER — DILTIAZEM HCL ER COATED BEADS 180 MG PO CP24: 180.0000 mg | ORAL_CAPSULE | Freq: Every day | ORAL | 3 refills | Status: DC

## 2020-10-03 NOTE — Patient Instructions (Signed)
Medication Instructions:  Your physician has recommended you make the following change in your medication:  STOP: Coreg START: Cardizem 180 mg take one tablet by mouth daily.  *If you need a refill on your cardiac medications before your next appointment, please call your pharmacy*   Lab Work: Your physician recommends that you return for lab work in: TODAY CMP, Lipids, Lpa If you have labs (blood work) drawn today and your tests are completely normal, you will receive your results only by: Marland Kitchen MyChart Message (if you have MyChart) OR . A paper copy in the mail If you have any lab test that is abnormal or we need to change your treatment, we will call you to review the results.   Testing/Procedures: None   Follow-Up: At [facility], you and your health needs are our priority.  As part of our continuing mission to provide you with exceptional heart care, we have created designated Provider Care Teams.  These Care Teams include your primary Cardiologist (physician) and Advanced Practice Providers (APPs -  Physician Assistants and Nurse Practitioners) who all work together to provide you with the care you need, when you need it.  We recommend signing up for the patient portal called "MyChart".  Sign up information is provided on this After Visit Summary.  MyChart is used to connect with patients for Virtual Visits (Telemedicine).  Patients are able to view lab/test results, encounter notes, upcoming appointments, etc.  Non-urgent messages can be sent to your provider as well.   To learn more about what you can do with MyChart, go to [email or web address].    Your next appointment:   6 month(s)  The format for your next appointment:   In Person  Provider:   Norman Herrlich, MD   Other Instructions

## 2020-10-03 NOTE — Progress Notes (Signed)
Cardiology Office Note:    Date:  10/03/2020   ID:  Alyssa, Ritter 10-12-1960, MRN [id number]  PCP:  Lonie Peak, PA-C  Cardiologist:  Norman Herrlich, MD    Referring MD: Lonie Peak, PA-C    ASSESSMENT:    1. Coronary artery disease involving native heart, unspecified vessel or lesion type, unspecified whether angina present   2. Hypercholesteremia   3. Malaise    PLAN:    In order of problems listed above:  1. Stable after PCI and stent she will need close clinical observation with a residual less than 50% left main stenosis and optimize medical therapy including PCSK9 inhibitor and likely a second agent like Repatha. 2. She has familial hyperlipidemia screen for LP(a) excess 3. Stop beta-blocker switch to rate limiting calcium channel blocker    Next appointment: 6 months   Medication Adjustments/Labs and Tests Ordered: Current medicines are reviewed at length with the patient today.  Concerns regarding medicines are outlined above.  No orders of the defined types were placed in this encounter.  No orders of the defined types were placed in this encounter.   Chief Complaint  Patient presents with  . Follow-up    After cardiac CTA     History of Present Illness:    Alyssa Ritter is a 60 y.o. female with a hx of hyperlipidemia with statin intolerance history of angina felt to be due to coronary vasospasm with previous angiography in 2005 showing mild nonobstructive right coronary artery stenosis.  She was last seen 08/01/2020 change in her anginal pattern with a prolonged episode of nocturnal chest pain..  She also has a history of peripheral arterial disease with previous right CIA stent.  Her baseline LDL severely elevated 236 consistent with familial hyperlipidemia she is statin intolerant and was referred to lipid clinic and initiated on PCSK9 therapy with Repatha.  She was last seen 09/02/2020.  CTA in January showing progressive CAD and  flow-limiting stenosis of the proximal right coronary artery and left main 25 to 49% stenosis.  I advised her to undergo coronary angiography She underwent coronary angiography 1 09/06/2020 with severe single-vessel disease involving the ostial and proximal right coronary artery 50 to 80% stenosis with abnormal flow reserve and had PCI and stenting drug-eluting with 2 overlapping stents.  She also had moderate 40% proximal left main stenosis and a normal ejection fraction.  Compliance with diet, lifestyle and medications: Yes she understands the necessity of dual antiplatelet therapy  She has had no angina shortness of breath palpitation or syncope but has marked malaise and fatigue related to her beta-blocker will discontinue transition to rate limiting calcium channel blocker.  She is intolerant of statins is on PCSK9 inhibitor and will check her labs today including LP(a) and if not at goal we can add Zetia to her regimen Past Medical History:  Diagnosis Date  . Anxiety disorder   . Benign essential tremor   . CAD (coronary artery disease)   . Constipation   . Depression with anxiety   . Hematuria   . Hypercholesteremia   . Peripheral vascular disease of extremity (HCC)   . Weight loss, abnormal     Past Surgical History:  Procedure Laterality Date  . CESAREAN SECTION    . CORONARY STENT INTERVENTION N/A 09/06/2020   Procedure: CORONARY STENT INTERVENTION;  Surgeon: Marykay Lex, MD;  Location[facility] INVASIVE CV LAB;  Service: Cardiovascular;  Laterality: N/A;  . LEFT HEART CATH AND CORONARY ANGIOGRAPHY  N/A 09/06/2020   Procedure: LEFT HEART CATH AND CORONARY ANGIOGRAPHY;  Surgeon: Marykay Lex, MD;  Location: [facility] INVASIVE CV LAB;  Service: Cardiovascular;  Laterality: N/A;  . OOPHORECTOMY Bilateral    Right cyst  . ROTATOR CUFF REPAIR Left 2006    Current Medications: Current Meds  Medication Sig  . aspirin EC 81 MG tablet Take 81 mg by mouth at bedtime. Swallow whole.  .  carvedilol (COREG) 3.125 MG tablet Take 1 tablet (3.125 mg total) by mouth 2 (two) times daily with a meal.  . clonazePAM (KLONOPIN) 1 MG tablet Take 0.5-1 mg by mouth 2 (two) times daily as needed for anxiety.  . clopidogrel (PLAVIX) 75 MG tablet Take 1 tablet (75 mg total) by mouth daily.  Marland Kitchen escitalopram (LEXAPRO) 20 MG tablet Take 20 mg by mouth at bedtime.  . Evolocumab 140 MG/ML SOAJ Inject 1 mL into the skin every 14 (fourteen) days.  . nitroGLYCERIN (NITROSTAT) 0.4 MG SL tablet Place 1 tablet (0.4 mg total) under the tongue every 5 (five) minutes as needed for chest pain.  . pantoprazole (PROTONIX) 40 MG tablet Take 1 tablet (40 mg total) by mouth daily.  . traZODone (DESYREL) 50 MG tablet Take 50 mg by mouth at bedtime.     Allergies:   Penicillins and Statins   Social History   Socioeconomic History  . Marital status: Married    Spouse name: Not on file  . Number of children: Not on file  . Years of education: Not on file  . Highest education level: Not on file  Occupational History  . Not on file  Tobacco Use  . Smoking status: Former Smoker    Types: Cigarettes    Quit date: 01/2018    Years since quitting: 2.6  . Smokeless tobacco: Current User  Vaping Use  . Vaping Use: Former  . Quit date: 01/29/2018  Substance and Sexual Activity  . Alcohol use: Yes    Comment: occasionally  . Drug use: Never  . Sexual activity: Not on file  Other Topics Concern  . Not on file  Social History Narrative  . Not on file   Social Determinants of Health   Financial Resource Strain: Not on file  Food Insecurity: Not on file  Transportation Needs: Not on file  Physical Activity: Not on file  Stress: Not on file  Social Connections: Not on file     Family History: The patient's family history includes Arthritis in her mother and sister; CAD in her father; Colon polyps in her mother; Hypercholesterolemia in her father and mother; Hypertension in her brother, father, and mother;  Seizures in her mother; Stroke in her brother; Thyroid disease in her sister. There is no history of Colon cancer or Breast cancer. ROS:   Please see the history of present illness.    All other systems reviewed and are negative.  EKGs/Labs/Other Studies Reviewed:    The following studies were reviewed today:    Recent Labs: 09/02/2020: BUN 11; Creatinine, Ser 0.85; Hemoglobin 14.4; Platelets 273; Potassium 3.9; Sodium 142  Recent Lipid Panel 07/09/2020 cholesterol 317 LDL 236 triglycerides 125 HDL 58.  Physical Exam:    VS:  BP 140/70   Pulse (!) 58   Ht 5\' 3"  (1.6 m)   Wt 143 lb (64.9 kg)   SpO2 98%   BMI 25.33 kg/m     Wt Readings from Last 3 Encounters:  10/03/20 143 lb (64.9 kg)  09/06/20 140 lb (63.5  kg)  09/02/20 141 lb 1.3 oz (64 kg)     GEN:  Well nourished, well developed in no acute distress she has no xanthoma or xanthelasma HEENT: Normal NECK: No JVD; No carotid bruits LYMPHATICS: No lymphadenopathy CARDIAC: RRR, no murmurs, rubs, gallops RESPIRATORY:  Clear to auscultation without rales, wheezing or rhonchi  ABDOMEN: Soft, non-tender, non-distended MUSCULOSKELETAL:  No edema; No deformity  SKIN: Warm and dry NEUROLOGIC:  Alert and oriented x 3 PSYCHIATRIC:  Normal affect    Signed, Norman Herrlich, MD  10/03/2020 9:39 AM    [facility]

## 2020-10-04 ENCOUNTER — Telehealth: Payer: Self-pay

## 2020-10-04 LAB — COMPREHENSIVE METABOLIC PANEL
ALT: 15 IU/L (ref 0–32)
AST: 25 IU/L (ref 0–40)
Albumin/Globulin Ratio: 2 (ref 1.2–2.2)
Albumin: 4.8 g/dL (ref 3.8–4.9)
Alkaline Phosphatase: 113 IU/L (ref 44–121)
BUN/Creatinine Ratio: 14 (ref 9–23)
BUN: 14 mg/dL (ref 6–24)
Bilirubin Total: <0.2 mg/dL (ref 0.0–1.2)
CO2: 24 mmol/L (ref 20–29)
Calcium: 9.8 mg/dL (ref 8.7–10.2)
Chloride: 100 mmol/L (ref 96–106)
Creatinine, Ser: 1 mg/dL (ref 0.57–1.00)
GFR calc Af Amer: 72 mL/min/{1.73_m2} (ref >59)
GFR calc non Af Amer: 63 mL/min/{1.73_m2} (ref >59)
Globulin, Total: 2.4 g/dL (ref 1.5–4.5)
Glucose: 93 mg/dL (ref 65–99)
Potassium: 4.6 mmol/L (ref 3.5–5.2)
Sodium: 140 mmol/L (ref 134–144)
Total Protein: 7.2 g/dL (ref 6.0–8.5)

## 2020-10-04 LAB — LIPID PANEL
Chol/HDL Ratio: 2.8 ratio (ref 0.0–4.4)
Cholesterol, Total: 137 mg/dL (ref 100–199)
HDL: 49 mg/dL (ref >39)
LDL Chol Calc (NIH): 69 mg/dL (ref 0–99)
Triglycerides: 102 mg/dL (ref 0–149)
VLDL Cholesterol Cal: 19 mg/dL (ref 5–40)

## 2020-10-04 LAB — LIPOPROTEIN A (LPA): Lipoprotein (a): 233.1 nmol/L — ABNORMAL HIGH (ref <75.0)

## 2020-10-04 NOTE — Telephone Encounter (Signed)
-----   Message from Baldo Daub, MD sent at 10/04/2020  7:55 AM EST ----- Good result lipids at target

## 2020-10-04 NOTE — Telephone Encounter (Signed)
Spoke with patient regarding results and recommendation.  Patient verbalizes understanding and is agreeable to plan of care. Advised patient to call back with any issues or concerns.  

## 2020-10-18 ENCOUNTER — Ambulatory Visit: Payer: BC Managed Care – PPO | Admitting: Cardiology

## 2020-10-29 DIAGNOSIS — I639 Cerebral infarction, unspecified: Secondary | ICD-10-CM | POA: Insufficient documentation

## 2020-10-29 HISTORY — DX: Cerebral infarction, unspecified: I63.9

## 2020-11-07 DIAGNOSIS — S93609A Unspecified sprain of unspecified foot, initial encounter: Secondary | ICD-10-CM | POA: Diagnosis not present

## 2020-11-08 ENCOUNTER — Emergency Department (HOSPITAL_COMMUNITY): Payer: BC Managed Care – PPO

## 2020-11-08 ENCOUNTER — Observation Stay (HOSPITAL_COMMUNITY): Payer: BC Managed Care – PPO

## 2020-11-08 ENCOUNTER — Encounter (HOSPITAL_COMMUNITY): Payer: Self-pay | Admitting: Emergency Medicine

## 2020-11-08 ENCOUNTER — Observation Stay (HOSPITAL_COMMUNITY)
Admission: EM | Admit: 2020-11-08 | Discharge: 2020-11-09 | Disposition: A | Discharge status: Home / Self Care | Payer: BC Managed Care – PPO | Attending: Internal Medicine | Admitting: Internal Medicine

## 2020-11-08 ENCOUNTER — Other Ambulatory Visit: Payer: Self-pay

## 2020-11-08 DIAGNOSIS — R531 Weakness: Secondary | ICD-10-CM | POA: Diagnosis not present

## 2020-11-08 DIAGNOSIS — I25119 Atherosclerotic heart disease of native coronary artery with unspecified angina pectoris: Secondary | ICD-10-CM | POA: Insufficient documentation

## 2020-11-08 DIAGNOSIS — G250 Essential tremor: Secondary | ICD-10-CM | POA: Diagnosis present

## 2020-11-08 DIAGNOSIS — I739 Peripheral vascular disease, unspecified: Secondary | ICD-10-CM | POA: Diagnosis present

## 2020-11-08 DIAGNOSIS — Z8673 Personal history of transient ischemic attack (TIA), and cerebral infarction without residual deficits: Secondary | ICD-10-CM | POA: Diagnosis not present

## 2020-11-08 DIAGNOSIS — E7800 Pure hypercholesterolemia, unspecified: Secondary | ICD-10-CM | POA: Diagnosis present

## 2020-11-08 DIAGNOSIS — G459 Transient cerebral ischemic attack, unspecified: Secondary | ICD-10-CM

## 2020-11-08 DIAGNOSIS — M79671 Pain in right foot: Secondary | ICD-10-CM | POA: Insufficient documentation

## 2020-11-08 DIAGNOSIS — I2510 Atherosclerotic heart disease of native coronary artery without angina pectoris: Secondary | ICD-10-CM | POA: Diagnosis present

## 2020-11-08 DIAGNOSIS — I10 Essential (primary) hypertension: Secondary | ICD-10-CM | POA: Insufficient documentation

## 2020-11-08 DIAGNOSIS — Z7982 Long term (current) use of aspirin: Secondary | ICD-10-CM | POA: Insufficient documentation

## 2020-11-08 DIAGNOSIS — R9431 Abnormal electrocardiogram [ECG] [EKG]: Secondary | ICD-10-CM | POA: Diagnosis not present

## 2020-11-08 DIAGNOSIS — Z20822 Contact with and (suspected) exposure to covid-19: Secondary | ICD-10-CM | POA: Diagnosis not present

## 2020-11-08 DIAGNOSIS — Z79899 Other long term (current) drug therapy: Secondary | ICD-10-CM | POA: Diagnosis not present

## 2020-11-08 DIAGNOSIS — W19XXXA Unspecified fall, initial encounter: Secondary | ICD-10-CM | POA: Insufficient documentation

## 2020-11-08 DIAGNOSIS — R233 Spontaneous ecchymoses: Secondary | ICD-10-CM | POA: Diagnosis not present

## 2020-11-08 DIAGNOSIS — Z87891 Personal history of nicotine dependence: Secondary | ICD-10-CM | POA: Diagnosis not present

## 2020-11-08 DIAGNOSIS — F419 Anxiety disorder, unspecified: Secondary | ICD-10-CM | POA: Diagnosis present

## 2020-11-08 HISTORY — DX: Transient cerebral ischemic attack, unspecified: G45.9

## 2020-11-08 LAB — COMPREHENSIVE METABOLIC PANEL
ALT: 12 U/L (ref 0–44)
AST: 25 U/L (ref 15–41)
Albumin: 4.1 g/dL (ref 3.5–5.0)
Alkaline Phosphatase: 83 U/L (ref 38–126)
Anion gap: 7 (ref 5–15)
BUN: 13 mg/dL (ref 6–20)
CO2: 28 mmol/L (ref 22–32)
Calcium: 9.4 mg/dL (ref 8.9–10.3)
Chloride: 105 mmol/L (ref 98–111)
Creatinine, Ser: 1.01 mg/dL — ABNORMAL HIGH (ref 0.44–1.00)
GFR, Estimated: >60 mL/min (ref >60)
Glucose, Bld: 89 mg/dL (ref 70–99)
Potassium: 3.9 mmol/L (ref 3.5–5.1)
Sodium: 140 mmol/L (ref 135–145)
Total Bilirubin: 0.8 mg/dL (ref 0.3–1.2)
Total Protein: 7.3 g/dL (ref 6.5–8.1)

## 2020-11-08 LAB — I-STAT CHEM 8, ED
BUN: 15 mg/dL (ref 6–20)
Calcium, Ion: 1.24 mmol/L (ref 1.15–1.40)
Chloride: 104 mmol/L (ref 98–111)
Creatinine, Ser: 1 mg/dL (ref 0.44–1.00)
Glucose, Bld: 86 mg/dL (ref 70–99)
HCT: 43 % (ref 36.0–46.0)
Hemoglobin: 14.6 g/dL (ref 12.0–15.0)
Potassium: 4 mmol/L (ref 3.5–5.1)
Sodium: 143 mmol/L (ref 135–145)
TCO2: 28 mmol/L (ref 22–32)

## 2020-11-08 LAB — DIFFERENTIAL
Abs Immature Granulocytes: 0.02 10*3/uL (ref 0.00–0.07)
Basophils Absolute: 0.1 10*3/uL (ref 0.0–0.1)
Basophils Relative: 1 %
Eosinophils Absolute: 0.3 10*3/uL (ref 0.0–0.5)
Eosinophils Relative: 3 %
Immature Granulocytes: 0 %
Lymphocytes Relative: 39 %
Lymphs Abs: 3.1 10*3/uL (ref 0.7–4.0)
Monocytes Absolute: 0.7 10*3/uL (ref 0.1–1.0)
Monocytes Relative: 9 %
Neutro Abs: 3.7 10*3/uL (ref 1.7–7.7)
Neutrophils Relative %: 48 %

## 2020-11-08 LAB — CBC
HCT: 43 % (ref 36.0–46.0)
Hemoglobin: 14 g/dL (ref 12.0–15.0)
MCH: 29 pg (ref 26.0–34.0)
MCHC: 32.6 g/dL (ref 30.0–36.0)
MCV: 89.2 fL (ref 80.0–100.0)
Platelets: 280 10*3/uL (ref 150–400)
RBC: 4.82 MIL/uL (ref 3.87–5.11)
RDW: 13.5 % (ref 11.5–15.5)
WBC: 7.8 10*3/uL (ref 4.0–10.5)
nRBC: 0 % (ref 0.0–0.2)

## 2020-11-08 LAB — PROTIME-INR
INR: 1 (ref 0.8–1.2)
Prothrombin Time: 12.9 seconds (ref 11.4–15.2)

## 2020-11-08 LAB — APTT: aPTT: 28 seconds (ref 24–36)

## 2020-11-08 MED ORDER — SODIUM CHLORIDE 0.9% FLUSH: 3.0000 mL | Freq: Once | INTRAVENOUS | Status: DC

## 2020-11-08 NOTE — ED Notes (Signed)
Rt foot swollen from a fall  1 1/2 week

## 2020-11-08 NOTE — ED Notes (Signed)
Pt waiting for mri

## 2020-11-08 NOTE — H&P (Signed)
History and Physical   Alyssa Ritter JME:[id number] DOB: 12-19-1960 DOA: 11/08/2020  Referring MD/NP/PA: Dr. Jeraldine Loots  PCP: Lonie Peak, PA-C   Outpatient Specialists: None  Patient coming from: Home  Chief Complaint: Right lower extremity weakness and fall  HPI: Alyssa Ritter is a 60 y.o. female with medical history significant of coronary artery disease, anxiety disorder, peripheral vascular disease, hyperlipidemia, depression anxiety, benign essential tremors and hypertension who presents to the ER with right lower extremity weakness that happened yesterday leading to patient having a fall.  Patient was stating the symptoms started after she work-up and was feeling weak and could not even feel her right leg properly.  She is then tried to get up and had a fall.  She did have some brief headache.  No nausea vomiting or diarrhea no dizziness.  Patient is now back to baseline but after talking to her daughter she encouraged her to come to the ER for evaluation.  In the ER so far she remained hemodynamically stable.  No other obvious findings.  Due to patient's presentation she is being admitted to the hospital for TIA work-up.  Neurology consulted.  No prior CVA but she has risk factors..  ED Course: Temperature 98.5 blood pressure 179/72 pulse 71 respiratory rate of 18 oxygen sats are 98% room air.  CBC and chemistry all within normal.  Head CT without contrast also negative.  EKG shows normal sinus rhythm with low voltage otherwise no ST changes.  Patient being admitted for TIA work-up.  Review of Systems: As per HPI otherwise 10 point review of systems negative.    Past Medical History:  Diagnosis Date  . Anxiety disorder   . Benign essential tremor   . CAD (coronary artery disease)   . Constipation   . Depression with anxiety   . Hematuria   . Hypercholesteremia   . Peripheral vascular disease of extremity (HCC)   . Weight loss, abnormal     Past Surgical  History:  Procedure Laterality Date  . CESAREAN SECTION    . CORONARY STENT INTERVENTION N/A 09/06/2020   Procedure: CORONARY STENT INTERVENTION;  Surgeon: Marykay Lex, MD;  Location: [facility] INVASIVE CV LAB;  Service: Cardiovascular;  Laterality: N/A;  . LEFT HEART CATH AND CORONARY ANGIOGRAPHY N/A 09/06/2020   Procedure: LEFT HEART CATH AND CORONARY ANGIOGRAPHY;  Surgeon: Marykay Lex, MD;  Location: [facility] INVASIVE CV LAB;  Service: Cardiovascular;  Laterality: N/A;  . OOPHORECTOMY Bilateral    Right cyst  . ROTATOR CUFF REPAIR Left 2006     reports that she quit smoking about 2 years ago. Her smoking use included cigarettes. She uses smokeless tobacco. She reports current alcohol use. She reports that she does not use drugs.  Allergies  Allergen Reactions  . Penicillins Anaphylaxis  . Statins     Kidney Injury    Family History  Problem Relation Age of Onset  . Colon polyps Mother   . Hypertension Mother   . Hypercholesterolemia Mother   . Arthritis Mother   . Seizures Mother   . Hypertension Father   . CAD Father        died of complications of AML  . Hypercholesterolemia Father   . Thyroid disease Sister   . Arthritis Sister   . Hypertension Brother   . Stroke Brother   . Colon cancer Neg Hx   . Breast cancer Neg Hx      Prior to Admission medications   Medication Sig Start  Date End Date Taking? Authorizing Provider  aspirin EC 81 MG tablet Take 81 mg by mouth at bedtime. Swallow whole.    [provider]  clonazePAM (KLONOPIN) 1 MG tablet Take 0.5-1 mg by mouth 2 (two) times daily as needed for anxiety. 04/25/20   [provider]  clopidogrel (PLAVIX) 75 MG tablet Take 1 tablet (75 mg total) by mouth daily. 09/06/20 03/05/21  Arty Baumgartner, NP  diltiazem (CARDIZEM CD) 180 MG 24 hr capsule Take 1 capsule (180 mg total) by mouth daily. 10/03/20 01/01/21  Baldo Daub, MD  escitalopram (LEXAPRO) 20 MG tablet Take 20 mg by mouth at bedtime. 05/25/20    [provider]  Evolocumab 140 MG/ML SOAJ Inject 1 mL into the skin every 14 (fourteen) days. 09/24/20 03/11/21  Baldo Daub, MD  nitroGLYCERIN (NITROSTAT) 0.4 MG SL tablet Place 1 tablet (0.4 mg total) under the tongue every 5 (five) minutes as needed for chest pain. 09/02/20   Baldo Daub, MD  pantoprazole (PROTONIX) 40 MG tablet Take 1 tablet (40 mg total) by mouth daily. 09/06/20 11/05/20  Arty Baumgartner, NP  traZODone (DESYREL) 50 MG tablet Take 50 mg by mouth at bedtime. 02/19/20   [provider]    Physical Exam: Vitals:   11/08/20 1942 11/08/20 2205 11/08/20 2230 11/08/20 2300  BP: (!) 148/65 (!) 179/72 (!) 152/67 (!) 157/72  Pulse: 60 71 66 61  Resp: 18 18 18 18   Temp: 97.9 F (36.6 C)  98.5 F (36.9 C)   SpO2: 98% 100% 100% 99%      Constitutional: Stable, anxious no distress Vitals:   11/08/20 1942 11/08/20 2205 11/08/20 2230 11/08/20 2300  BP: (!) 148/65 (!) 179/72 (!) 152/67 (!) 157/72  Pulse: 60 71 66 61  Resp: 18 18 18 18   Temp: 97.9 F (36.6 C)  98.5 F (36.9 C)   SpO2: 98% 100% 100% 99%   Eyes: PERRL, lids and conjunctivae normal ENMT: Mucous membranes are moist. Posterior pharynx clear of any exudate or lesions.Normal dentition.  Neck: normal, supple, no masses, no thyromegaly Respiratory: clear to auscultation bilaterally, no wheezing, no crackles. Normal respiratory effort. No accessory muscle use.  Cardiovascular: Regular rate and rhythm, no murmurs / rubs / gallops. No extremity edema. 2+ pedal pulses. No carotid bruits.  Abdomen: no tenderness, no masses palpated. No hepatosplenomegaly. Bowel sounds positive.  Musculoskeletal: no clubbing / cyanosis. No joint deformity upper and lower extremities. Good ROM, no contractures. Normal muscle tone.  Skin: no rashes, lesions, ulcers. No induration Neurologic: CN 2-12 grossly intact. Sensation intact, DTR normal. Strength 5/5 in all 4.  Psychiatric: Normal judgment and insight. Alert and  oriented x 3.  Anxious mood.     Labs on Admission: I have personally reviewed following labs and imaging studies  CBC: Recent Labs  Lab 11/08/20 1713 11/08/20 1731  WBC 7.8  --   NEUTROABS 3.7  --   HGB 14.0 14.6  HCT 43.0 43.0  MCV 89.2  --   PLT 280  --    Basic Metabolic Panel: Recent Labs  Lab 11/08/20 1713 11/08/20 1731  NA 140 143  K 3.9 4.0  CL 105 104  CO2 28  --   GLUCOSE 89 86  BUN 13 15  CREATININE 1.01* 1.00  CALCIUM 9.4  --    GFR: CrCl cannot be calculated (Unknown ideal weight.). Liver Function Tests: Recent Labs  Lab 11/08/20 1713  AST 25  ALT 12  ALKPHOS 83  BILITOT 0.8  PROT 7.3  ALBUMIN 4.1   No results for input(s): LIPASE, AMYLASE in the last 168 hours. No results for input(s): AMMONIA in the last 168 hours. Coagulation Profile: Recent Labs  Lab 11/08/20 1713  INR 1.0   Cardiac Enzymes: No results for input(s): CKTOTAL, CKMB, CKMBINDEX, TROPONINI in the last 168 hours. BNP (last 3 results) No results for input(s): PROBNP in the last 8760 hours. HbA1C: No results for input(s): HGBA1C in the last 72 hours. CBG: No results for input(s): GLUCAP in the last 168 hours. Lipid Profile: No results for input(s): CHOL, HDL, LDLCALC, TRIG, CHOLHDL, LDLDIRECT in the last 72 hours. Thyroid Function Tests: No results for input(s): TSH, T4TOTAL, FREET4, T3FREE, THYROIDAB in the last 72 hours. Anemia Panel: No results for input(s): VITAMINB12, FOLATE, FERRITIN, TIBC, IRON, RETICCTPCT in the last 72 hours. Urine analysis: No results found for: COLORURINE, APPEARANCEUR, LABSPEC, PHURINE, GLUCOSEU, HGBUR, BILIRUBINUR, KETONESUR, PROTEINUR, UROBILINOGEN, NITRITE, LEUKOCYTESUR Sepsis Labs: @LABRCNTIP (procalcitonin:4,lacticidven:4) )No results found for this or any previous visit (from the past 240 hour(s)).   Radiological Exams on Admission: CT HEAD WO CONTRAST  Result Date: 11/08/2020 CLINICAL DATA:  Transient ischemic attack (TIA) EXAM: CT  HEAD WITHOUT CONTRAST TECHNIQUE: Contiguous axial images were obtained from the base of the skull through the vertex without intravenous contrast. COMPARISON:  None. FINDINGS: Brain: Mild generalized atrophy, slightly age advanced. No intracranial hemorrhage, mass effect, or midline shift. No hydrocephalus. The basilar cisterns are patent. No evidence of territorial infarct or acute ischemia. No extra-axial or intracranial fluid collection. Vascular: Atherosclerosis of skullbase vasculature without hyperdense vessel or abnormal calcification. Skull: No fracture or focal lesion. Sinuses/Orbits: Paranasal sinuses and mastoid air cells are clear. The visualized orbits are unremarkable. Other: None. IMPRESSION: 1. No acute intracranial abnormality. 2. Mild generalized atrophy, slightly age advanced. Electronically Signed   By: 01/08/2021 M.D.   On: 11/08/2020 19:15   DG Foot Complete Right  Result Date: 11/08/2020 CLINICAL DATA:  01/08/2021, dorsal right foot swelling and bruising EXAM: RIGHT FOOT COMPLETE - 3+ VIEW COMPARISON:  None. FINDINGS: Frontal, oblique, and lateral views of the right foot are obtained. No acute fracture, subluxation, or dislocation. Joint spaces are well preserved. Marked dorsal soft tissue swelling of the forefoot. IMPRESSION: 1. Dorsal soft tissue swelling of the forefoot. No underlying fracture. Electronically Signed   By: Larey Seat M.D.   On: 11/08/2020 21:49    Assessment/Plan Principal Problem:   TIA (transient ischemic attack) Active Problems:   Peripheral vascular disease of extremity (HCC)   Hypercholesteremia   Benign essential tremor   Anxiety disorder   CAD (coronary artery disease)     #1 TIA: Patient will be admitted to medical floor for observation.  Get MRI of the brain carotid Dopplers and echocardiogram.  Neurology already consulted and will follow recommendations.  Aspirin and Plavix.  Patient is apparently allergic to statins.  #2 hyperlipidemia:  Again patient allergic to statins.  Defer to neurology.  #3 anxiety disorder: Confirm and resume home regimen  #4 benign essential tremors: Confirm and continue home regimen.  #5 coronary artery disease: Appears compensated.  Continue to monitor  #6 peripheral vascular disease: Stable.  Continue Plavix.    DVT prophylaxis: Lovenox Code Status: Full code Family Communication: Daughter Disposition Plan: Home Consults called: Dr. 01/08/2021, neurologist Admission status: Observation  Severity of Illness: The appropriate patient status for this patient is OBSERVATION. Observation status is judged to be reasonable and necessary in order  to provide the required intensity of service to ensure the patient's safety. The patient's presenting symptoms, physical exam findings, and initial radiographic and laboratory data in the context of their medical condition is felt to place them at decreased risk for further clinical deterioration. Furthermore, it is anticipated that the patient will be medically stable for discharge from the hospital within 2 midnights of admission. The following factors support the patient status of observation.   " The patient's presenting symptoms include right lower extremity weakness. " The physical exam findings include no focal weakness. " The initial radiographic and laboratory data are negative head CT.     Lonia BloodGARBA,LAWAL MD [facility] Pager [phone]  If 7PM-7AM, please contact night-coverage [email or web address] Password [email or web address]  11/08/2020, 11:15 PM

## 2020-11-08 NOTE — ED Provider Notes (Signed)
[facility] EMERGENCY DEPARTMENT Provider Note   CSN: [id number] Arrival date & time: 11/08/20  1706     History Chief Complaint  Patient presents with  . Extremity Weakness    Alyssa Ritter is a 60 y.o. female.  HPI Patient with multiple medical issues presents with concern of an episode of right lower extremity weakness that occurred yesterday, resulting in a fall.  Patient also complains of left occipital headache.  Prior to yesterday she was well.  Yesterday she awoke, and states that she did not feel her right leg at all.  She subsequently fell.  That lasted an unspecified amount of time, but the patient has returned to having sensation throughout her right leg including pain in the right foot, which she bruised during the fall.  No head trauma.  She notes that about that time she noticed pain in her left occipital region.  No vision changes, no nausea, vomiting, other head pain.  She has not taken any medication for relief.  Today she described her episode to her daughter, who is a Engineer, civil (consulting).  She was encouraged to come here for evaluation.  She is joined by her daughter in the room.  Daughter provides additional historical details.    Past Medical History:  Diagnosis Date  . Anxiety disorder   . Benign essential tremor   . CAD (coronary artery disease)   . Constipation   . Depression with anxiety   . Hematuria   . Hypercholesteremia   . Peripheral vascular disease of extremity (HCC)   . Weight loss, abnormal     Patient Active Problem List   Diagnosis Date Noted  . CAD (coronary artery disease)   . Abnormal cardiac CT angiography 09/06/2020  . Progressive angina (HCC) 09/06/2020  . Weight loss, abnormal   . Peripheral vascular disease of extremity (HCC)   . Hypercholesteremia   . Hematuria   . Depression with anxiety   . Constipation   . Coronary artery disease involving native coronary artery of native heart with angina pectoris (HCC)   .  Benign essential tremor   . Anxiety disorder     Past Surgical History:  Procedure Laterality Date  . CESAREAN SECTION    . CORONARY STENT INTERVENTION N/A 09/06/2020   Procedure: CORONARY STENT INTERVENTION;  Surgeon: Marykay Lex, MD;  Location: [facility] INVASIVE CV LAB;  Service: Cardiovascular;  Laterality: N/A;  . LEFT HEART CATH AND CORONARY ANGIOGRAPHY N/A 09/06/2020   Procedure: LEFT HEART CATH AND CORONARY ANGIOGRAPHY;  Surgeon: Marykay Lex, MD;  Location: [facility] INVASIVE CV LAB;  Service: Cardiovascular;  Laterality: N/A;  . OOPHORECTOMY Bilateral    Right cyst  . ROTATOR CUFF REPAIR Left 2006     OB History   No obstetric history on file.     Family History  Problem Relation Age of Onset  . Colon polyps Mother   . Hypertension Mother   . Hypercholesterolemia Mother   . Arthritis Mother   . Seizures Mother   . Hypertension Father   . CAD Father        died of complications of AML  . Hypercholesterolemia Father   . Thyroid disease Sister   . Arthritis Sister   . Hypertension Brother   . Stroke Brother   . Colon cancer Neg Hx   . Breast cancer Neg Hx     Social History   Tobacco Use  . Smoking status: Former Smoker    Types: Cigarettes  Quit date: 01/2018    Years since quitting: 2.7  . Smokeless tobacco: Current User  Vaping Use  . Vaping Use: Former  . Quit date: 01/29/2018  Substance Use Topics  . Alcohol use: Yes    Comment: occasionally  . Drug use: Never    Home Medications Prior to Admission medications   Medication Sig Start Date End Date Taking? Authorizing Provider  aspirin EC 81 MG tablet Take 81 mg by mouth at bedtime. Swallow whole.    [provider]  clonazePAM (KLONOPIN) 1 MG tablet Take 0.5-1 mg by mouth 2 (two) times daily as needed for anxiety. 04/25/20   [provider]  clopidogrel (PLAVIX) 75 MG tablet Take 1 tablet (75 mg total) by mouth daily. 09/06/20 03/05/21  Arty Baumgartner, NP  diltiazem (CARDIZEM CD) 180  MG 24 hr capsule Take 1 capsule (180 mg total) by mouth daily. 10/03/20 01/01/21  Baldo Daub, MD  escitalopram (LEXAPRO) 20 MG tablet Take 20 mg by mouth at bedtime. 05/25/20   [provider]  Evolocumab 140 MG/ML SOAJ Inject 1 mL into the skin every 14 (fourteen) days. 09/24/20 03/11/21  Baldo Daub, MD  nitroGLYCERIN (NITROSTAT) 0.4 MG SL tablet Place 1 tablet (0.4 mg total) under the tongue every 5 (five) minutes as needed for chest pain. 09/02/20   Baldo Daub, MD  pantoprazole (PROTONIX) 40 MG tablet Take 1 tablet (40 mg total) by mouth daily. 09/06/20 11/05/20  Arty Baumgartner, NP  traZODone (DESYREL) 50 MG tablet Take 50 mg by mouth at bedtime. 02/19/20   [provider]    Allergies    Penicillins and Statins  Review of Systems   Review of Systems  Constitutional:       Per HPI, otherwise negative  HENT:       Per HPI, otherwise negative  Respiratory:       Per HPI, otherwise negative  Cardiovascular:       Per HPI, otherwise negative  Gastrointestinal: Negative for vomiting.  Endocrine:       Negative aside from HPI  Genitourinary:       Neg aside from HPI   Musculoskeletal:       Per HPI, otherwise negative  Skin: Positive for color change and wound.  Neurological: Negative for syncope.    Physical Exam Updated Vital Signs BP (!) 179/72   Pulse 71   Temp 97.9 F (36.6 C)   Resp 18   SpO2 100%   Physical Exam Vitals and nursing note reviewed.  Constitutional:      General: She is not in acute distress.    Appearance: She is well-developed.  HENT:     Head: Normocephalic and atraumatic.   Eyes:     Conjunctiva/sclera: Conjunctivae normal.  Cardiovascular:     Rate and Rhythm: Normal rate and regular rhythm.  Pulmonary:     Effort: Pulmonary effort is normal. No respiratory distress.     Breath sounds: Normal breath sounds. No stridor.  Abdominal:     General: There is no distension.  Musculoskeletal:     Cervical back: No spinous  process tenderness or muscular tenderness.       Legs:  Skin:    General: Skin is warm and dry.  Neurological:     Mental Status: She is alert and oriented to person, place, and time.     Cranial Nerves: No cranial nerve deficit.     Motor: No weakness, tremor, atrophy or  abnormal muscle tone.     Coordination: Coordination normal.     ED Results / Procedures / Treatments   Labs (all labs ordered are listed, but only abnormal results are displayed) Labs Reviewed  COMPREHENSIVE METABOLIC PANEL - Abnormal; Notable for the following components:      Result Value   Creatinine, Ser 1.01 (*)    All other components within normal limits  PROTIME-INR  APTT  CBC  DIFFERENTIAL  I-STAT CHEM 8, ED  CBG MONITORING, ED    EKG EKG Interpretation  Date/Time:  Friday November 08 2020 17:11:41 EST Ventricular Rate:  61 PR Interval:  116 QRS Duration: 68 QT Interval:  392 QTC Calculation: 394 R Axis:   31 Text Interpretation: Normal sinus rhythm Low voltage QRS Nonspecific ST and T wave abnormality Abnormal ECG Confirmed by Gerhard MunchLockwood, Mayur Duman [phone]([id number]) on 11/08/2020 10:42:22 PM   Radiology CT HEAD WO CONTRAST  Result Date: 11/08/2020 CLINICAL DATA:  Transient ischemic attack (TIA) EXAM: CT HEAD WITHOUT CONTRAST TECHNIQUE: Contiguous axial images were obtained from the base of the skull through the vertex without intravenous contrast. COMPARISON:  None. FINDINGS: Brain: Mild generalized atrophy, slightly age advanced. No intracranial hemorrhage, mass effect, or midline shift. No hydrocephalus. The basilar cisterns are patent. No evidence of territorial infarct or acute ischemia. No extra-axial or intracranial fluid collection. Vascular: Atherosclerosis of skullbase vasculature without hyperdense vessel or abnormal calcification. Skull: No fracture or focal lesion. Sinuses/Orbits: Paranasal sinuses and mastoid air cells are clear. The visualized orbits are unremarkable. Other: None. IMPRESSION: 1. No  acute intracranial abnormality. 2. Mild generalized atrophy, slightly age advanced. Electronically Signed   By: Narda RutherfordMelanie  Sanford M.D.   On: 11/08/2020 19:15   DG Foot Complete Right  Result Date: 11/08/2020 CLINICAL DATA:  Larey SeatFell, dorsal right foot swelling and bruising EXAM: RIGHT FOOT COMPLETE - 3+ VIEW COMPARISON:  None. FINDINGS: Frontal, oblique, and lateral views of the right foot are obtained. No acute fracture, subluxation, or dislocation. Joint spaces are well preserved. Marked dorsal soft tissue swelling of the forefoot. IMPRESSION: 1. Dorsal soft tissue swelling of the forefoot. No underlying fracture. Electronically Signed   By: Sharlet SalinaMichael  Brown M.D.   On: 11/08/2020 21:49    Procedures Procedures   Medications Ordered in ED Medications  sodium chloride flush (NS) 0.9 % injection 3 mL (has no administration in time range)    ED Course  I have reviewed the triage vital signs and the nursing notes.  Pertinent labs & imaging results that were available during my care of the patient were reviewed by me and considered in my medical decision making (see chart for details).    11:14 PM Patient in no distress.  I have reviewed the CT imaging, x-rays, labs thus far with her and her daughter again.  Patient is preparing for MRI.  I discussed her case with our internal medicine colleague, and our neurology colleague. Given concern for TIA given the description of paresthesia, ongoing left head pain, the patient will require admission with ongoing evaluation for TIA/stroke.  Other findings somewhat reassuring, no evidence for infection, no evidence for foot fracture, and no current neurologic phenomena. MDM Rules/Calculators/A&P MDM Number of Diagnoses or Management Options Fall, initial encounter: new, needed workup TIA (transient ischemic attack): new, needed workup   Amount and/or Complexity of Data Reviewed Clinical lab tests: reviewed Tests in the radiology section of CPT:  reviewed Tests in the medicine section of CPT: reviewed Decide to obtain previous medical records or to  obtain history from someone other than the patient: yes Obtain history from someone other than the patient: yes Review and summarize past medical records: yes Discuss the patient with other providers: yes Independent visualization of images, tracings, or specimens: yes  Risk of Complications, Morbidity, and/or Mortality Presenting problems: high Diagnostic procedures: high Management options: high  Critical Care Total time providing critical care: < 30 minutes  Patient Progress Patient progress: stable   Final Clinical Impression(s) / ED Diagnoses Final diagnoses:  Fall, initial encounter  TIA (transient ischemic attack)     Gerhard Munch, MD 11/08/20 2315

## 2020-11-08 NOTE — Consult Note (Signed)
Referring Physician: Dr. Jeraldine Loots    Chief Complaint: Transient RLE weakness and numbness  HPI: Alyssa Ritter is an 60 y.o. female with a PMHx of CAD s/p stenting, hypercholesterolemia and PVD who presented to the ED with an episode of right leg weakness and numbness lasting approximately five minutes on Thursday morning. She felt normal on awakening Thursday, but when she got out of bed to stand, her RLE collapsed "as if it wasn't there". In the fall, she sustained bruising to the dorsal aspect of her right foot. The numbness involved the entirety of her leg proximally and distally in a circumferential, non-dermatomal distribution. No paresthesias, just a complete lack of sensation. She states that the numbness lasted for about 5 minutes and then resolved spontaneously. She is unclear of whether the numbness also included her hip, but she states no symptoms to her right arm and no facial droop. Her LLE felt normal. She was evaluated at Urgent Care, including an x-ray of her right foot which showed no fracture, and sent home. Family urged her to get re-evaluated. On presentation to the ED on Friday night, the patient denied any current symptoms. She was alert, oriented, and in no apparent distress at presentation. CT head in the ED was negative for acute abnormality. She endorses having had an left sided occipital headache with left sided neck pain since Tuesday.   Home medications include ASA and Plavix.   CT head: - No acute intracranial abnormality. - Mild generalized atrophy, slightly age advanced. - Atherosclerosis of skullbase vasculature without hyperdense vessel or abnormal calcification.   Past Medical History:  Diagnosis Date  . Anxiety disorder   . Benign essential tremor   . CAD (coronary artery disease)   . Constipation   . Depression with anxiety   . Hematuria   . Hypercholesteremia   . Peripheral vascular disease of extremity (HCC)   . Weight loss, abnormal     Past  Surgical History:  Procedure Laterality Date  . CESAREAN SECTION    . CORONARY STENT INTERVENTION N/A 09/06/2020   Procedure: CORONARY STENT INTERVENTION;  Surgeon: Marykay Lex, MD;  Location: [facility] INVASIVE CV LAB;  Service: Cardiovascular;  Laterality: N/A;  . LEFT HEART CATH AND CORONARY ANGIOGRAPHY N/A 09/06/2020   Procedure: LEFT HEART CATH AND CORONARY ANGIOGRAPHY;  Surgeon: Marykay Lex, MD;  Location: [facility] INVASIVE CV LAB;  Service: Cardiovascular;  Laterality: N/A;  . OOPHORECTOMY Bilateral    Right cyst  . ROTATOR CUFF REPAIR Left 2006    Family History  Problem Relation Age of Onset  . Colon polyps Mother   . Hypertension Mother   . Hypercholesterolemia Mother   . Arthritis Mother   . Seizures Mother   . Hypertension Father   . CAD Father        died of complications of AML  . Hypercholesterolemia Father   . Thyroid disease Sister   . Arthritis Sister   . Hypertension Brother   . Stroke Brother   . Colon cancer Neg Hx   . Breast cancer Neg Hx    Social History:  reports that she quit smoking about 2 years ago. Her smoking use included cigarettes. She uses smokeless tobacco. She reports current alcohol use. She reports that she does not use drugs.  Allergies:  Allergies  Allergen Reactions  . Penicillins Anaphylaxis  . Statins     Kidney Injury    Medications:  No current facility-administered medications on file prior to encounter.  Current Outpatient Medications on File Prior to Encounter  Medication Sig Dispense Refill  . aspirin EC 81 MG tablet Take 81 mg by mouth at bedtime. Swallow whole.    . clonazePAM (KLONOPIN) 1 MG tablet Take 0.5-1 mg by mouth 2 (two) times daily as needed for anxiety.    . clopidogrel (PLAVIX) 75 MG tablet Take 1 tablet (75 mg total) by mouth daily. 90 tablet 1  . diltiazem (CARDIZEM CD) 180 MG 24 hr capsule Take 1 capsule (180 mg total) by mouth daily. 90 capsule 3  . escitalopram (LEXAPRO) 20 MG tablet Take 20 mg by mouth at  bedtime.    . Evolocumab 140 MG/ML SOAJ Inject 1 mL into the skin every 14 (fourteen) days. 6 mL 3  . nitroGLYCERIN (NITROSTAT) 0.4 MG SL tablet Place 1 tablet (0.4 mg total) under the tongue every 5 (five) minutes as needed for chest pain. 30 tablet 0  . pantoprazole (PROTONIX) 40 MG tablet Take 1 tablet (40 mg total) by mouth daily. 30 tablet 1  . traZODone (DESYREL) 50 MG tablet Take 50 mg by mouth at bedtime.      Scheduled: .  stroke: mapping our early stages of recovery book   Does not apply Once  . aspirin  300 mg Rectal Daily   Or  . aspirin  325 mg Oral Daily  . enoxaparin (LOVENOX) injection  40 mg Subcutaneous Daily  . sodium chloride flush  3 mL Intravenous Once    ROS: As per HPI. Comprehensive ROS otherwise negative.   Physical Examination: Blood pressure (!) 148/65, pulse 60, temperature 97.9 F (36.6 C), resp. rate 18, SpO2 98 %.  HEENT: [place]/AT Lungs: Respirations unlabored Ext: Bruising and swelling to dorsal aspect of right foot  Neurologic Examination: Mental Status: Alert, fully oriented, thought content appropriate.  Speech fluent without evidence of aphasia.  Able to follow all commands without difficulty. Cranial Nerves: II:  Temporal visual fields intact with no extinction to DSS. PERRL.  III,IV, VI: No ptosis. EOMI with saccadic quality to pursuits noted.  V,VII: Temp sensation equal bilaterally. Smile symmetric.  VIII: Hearing intact to voice IX,X: No hypophonia XI: Symmetric XII: Midline tongue extension  Motor: Right : Upper extremity   5/5    Left:     Upper extremity   5/5  Lower extremity   5/5     Lower extremity   5/5 Full strength as noted above except for right ADF decreased effort due to foot pain Normal tone throughout; no atrophy noted No pronator drift No bradykinesia or tremor Sensory: Temp and light touch sensation intact in all 4 extremities. No extinction to DSS.  Deep Tendon Reflexes:  2+ bilateral brachioradialis, biceps,  patellae and achilles reflexes Plantars: Right: downgoing  Left: downgoing Cerebellar: No ataxia with FNF bilaterally Gait: Deferred  Results for orders placed or performed during the hospital encounter of 11/08/20 (from the past 48 hour(s))  Protime-INR     Status: None   Collection Time: 11/08/20  5:13 PM  Result Value Ref Range   Prothrombin Time 12.9 11.4 - 15.2 seconds   INR 1.0 0.8 - 1.2    Comment: (NOTE) INR goal varies based on device and disease states. Performed at [facility], [address], [place], [place] [address]   APTT     Status: None   Collection Time: 11/08/20  5:13 PM  Result Value Ref Range   aPTT 28 24 - 36 seconds    Comment:  Performed at [facility], [address] [place], [place] [address]  CBC     Status: None   Collection Time: 11/08/20  5:13 PM  Result Value Ref Range   WBC 7.8 4.0 - 10.5 K/uL   RBC 4.82 3.87 - 5.11 MIL/uL   Hemoglobin 14.0 12.0 - 15.0 g/dL   HCT 60.443.0 54.036.0 - 98.146.0 %   MCV 89.2 80.0 - 100.0 fL   MCH 29.0 26.0 - 34.0 pg   MCHC 32.6 30.0 - 36.0 g/dL   RDW 19.113.5 47.811.5 - 29.515.5 %   Platelets 280 150 - 400 K/uL   nRBC 0.0 0.0 - 0.2 %    Comment: Performed at [facility], [address] [place], [place] [address]  Differential     Status: None   Collection Time: 11/08/20  5:13 PM  Result Value Ref Range   Neutrophils Relative % 48 %   Neutro Abs 3.7 1.7 - 7.7 K/uL   Lymphocytes Relative 39 %   Lymphs Abs 3.1 0.7 - 4.0 K/uL   Monocytes Relative 9 %   Monocytes Absolute 0.7 0.1 - 1.0 K/uL   Eosinophils Relative 3 %   Eosinophils Absolute 0.3 0.0 - 0.5 K/uL   Basophils Relative 1 %   Basophils Absolute 0.1 0.0 - 0.1 K/uL   Immature Granulocytes 0 %   Abs Immature Granulocytes 0.02 0.00 - 0.07 K/uL    Comment: Performed at [facility], [address] [place], [place] [address]  Comprehensive metabolic panel     Status: Abnormal   Collection Time: 11/08/20  5:13 PM  Result Value Ref Range   Sodium 140  135 - 145 mmol/L   Potassium 3.9 3.5 - 5.1 mmol/L   Chloride 105 98 - 111 mmol/L   CO2 28 22 - 32 mmol/L   Glucose, Bld 89 70 - 99 mg/dL    Comment: Glucose reference range applies only to samples taken after fasting for at least 8 hours.   BUN 13 6 - 20 mg/dL   Creatinine, Ser 4.691.01 (H) 0.44 - 1.00 mg/dL   Calcium 9.4 8.9 - 62.910.3 mg/dL   Total Protein 7.3 6.5 - 8.1 g/dL   Albumin 4.1 3.5 - 5.0 g/dL   AST 25 15 - 41 U/L   ALT 12 0 - 44 U/L   Alkaline Phosphatase 83 38 - 126 U/L   Total Bilirubin 0.8 0.3 - 1.2 mg/dL   GFR, Estimated >52>60 >84>60 mL/min    Comment: (NOTE) Calculated using the CKD-EPI Creatinine Equation (2021)    Anion gap 7 5 - 15    Comment: Performed at [facility], [address] [place], [place] [address]  I-stat chem 8, ED     Status: None   Collection Time: 11/08/20  5:31 PM  Result Value Ref Range   Sodium 143 135 - 145 mmol/L   Potassium 4.0 3.5 - 5.1 mmol/L   Chloride 104 98 - 111 mmol/L   BUN 15 6 - 20 mg/dL   Creatinine, Ser 0.101.00 0.44 - 1.00 mg/dL   Glucose, Bld 86 70 - 99 mg/dL    Comment: Glucose reference range applies only to samples taken after fasting for at least 8 hours.   Calcium, Ion 1.24 1.15 - 1.40 mmol/L   TCO2 28 22 - 32 mmol/L   Hemoglobin 14.6 12.0 - 15.0 g/dL   HCT 27.243.0 53.636.0 - 64.446.0 %   CT HEAD WO CONTRAST  Result Date: 11/08/2020 CLINICAL DATA:  Transient ischemic attack (TIA) EXAM: CT HEAD WITHOUT CONTRAST TECHNIQUE: Contiguous axial images were obtained from the base of the skull through the vertex without intravenous contrast. COMPARISON:  None. FINDINGS: Brain: Mild generalized atrophy, slightly age advanced. No intracranial hemorrhage, mass effect, or midline shift. No hydrocephalus. The basilar cisterns are patent. No evidence of territorial infarct or acute ischemia. No extra-axial or intracranial fluid collection. Vascular: Atherosclerosis of skullbase vasculature without hyperdense vessel or abnormal calcification. Skull: No  fracture or focal lesion. Sinuses/Orbits: Paranasal sinuses and mastoid air cells are clear. The visualized orbits are unremarkable. Other: None. IMPRESSION: 1. No acute intracranial abnormality. 2. Mild generalized atrophy, slightly age advanced. Electronically Signed   By: Narda Rutherford M.D.   On: 11/08/2020 19:15    Assessment: 60 y.o. female presenting after an episode of transient RLE weakness and sensory loss on Thursday 1. Neurological exam is nonfocal.  2. CT head: No acute intracranial abnormality. Mild generalized atrophy, slightly age advanced. Atherosclerosis of skullbase vasculature without hyperdense vessel or abnormal calcification. 3. MRI brain: Normal 4. MRA head: Normal 5. EKG: Normal sinus rhythm. Low voltage QRS. Nonspecific ST and T wave abnormality. 6. Stroke Risk Factors - CAD, hypercholesterolemia and PVD   Recommendations: 1. HgbA1c, fasting lipid panel 2. TTE 3. PT consult, OT consult, Speech consult 4. Carotid dopplers are pending 5. May also need CTA of head and neck to evaluate for possible dissection given her posterior neck pain and occipital headache for 2 days prior to onset of her RLE weakness. Will defer to Stroke Team for final decision on this.  6. Risk factor modification 7. Telemetry monitoring 8. Frequent neuro checks 9. She has a statin allergy . 10. Continue ASA and Plavix 11. BP management. Out of the permissive HTN time window.    @Electronically  signed: Dr.  11/08/2020, 9:41 PM

## 2020-11-08 NOTE — ED Notes (Signed)
To mri 

## 2020-11-08 NOTE — ED Triage Notes (Signed)
Patient complains of an episode of right leg weakness and numbness lasting approximately five minutes yesterday morning. Patient denies any complaints today. Patient alert, oriented, and in no apparent distress at this time.

## 2020-11-09 ENCOUNTER — Observation Stay (HOSPITAL_BASED_OUTPATIENT_CLINIC_OR_DEPARTMENT_OTHER): Payer: BC Managed Care – PPO

## 2020-11-09 ENCOUNTER — Observation Stay (HOSPITAL_COMMUNITY): Payer: BC Managed Care – PPO

## 2020-11-09 DIAGNOSIS — I63233 Cerebral infarction due to unspecified occlusion or stenosis of bilateral carotid arteries: Secondary | ICD-10-CM | POA: Diagnosis not present

## 2020-11-09 DIAGNOSIS — E7800 Pure hypercholesterolemia, unspecified: Secondary | ICD-10-CM

## 2020-11-09 DIAGNOSIS — R9431 Abnormal electrocardiogram [ECG] [EKG]: Secondary | ICD-10-CM | POA: Diagnosis not present

## 2020-11-09 DIAGNOSIS — F419 Anxiety disorder, unspecified: Secondary | ICD-10-CM

## 2020-11-09 DIAGNOSIS — G459 Transient cerebral ischemic attack, unspecified: Secondary | ICD-10-CM | POA: Diagnosis not present

## 2020-11-09 DIAGNOSIS — I6529 Occlusion and stenosis of unspecified carotid artery: Secondary | ICD-10-CM

## 2020-11-09 DIAGNOSIS — I739 Peripheral vascular disease, unspecified: Secondary | ICD-10-CM

## 2020-11-09 DIAGNOSIS — I672 Cerebral atherosclerosis: Secondary | ICD-10-CM | POA: Diagnosis not present

## 2020-11-09 DIAGNOSIS — G250 Essential tremor: Secondary | ICD-10-CM | POA: Diagnosis not present

## 2020-11-09 HISTORY — DX: Occlusion and stenosis of unspecified carotid artery: I65.29

## 2020-11-09 LAB — CBC
HCT: 39.7 % (ref 36.0–46.0)
Hemoglobin: 13.5 g/dL (ref 12.0–15.0)
MCH: 29.5 pg (ref 26.0–34.0)
MCHC: 34 g/dL (ref 30.0–36.0)
MCV: 86.9 fL (ref 80.0–100.0)
Platelets: 250 10*3/uL (ref 150–400)
RBC: 4.57 MIL/uL (ref 3.87–5.11)
RDW: 13.6 % (ref 11.5–15.5)
WBC: 8.7 10*3/uL (ref 4.0–10.5)
nRBC: 0 % (ref 0.0–0.2)

## 2020-11-09 LAB — ECHOCARDIOGRAM COMPLETE
AR max vel: 1.2 cm2
AV Area VTI: 1.4 cm2
AV Area mean vel: 1.19 cm2
AV Mean grad: 4 mmHg
AV Peak grad: 8.4 mmHg
Ao pk vel: 1.45 m/s
Area-P 1/2: 2.99 cm2
Calc EF: 64.1 %
S' Lateral: 3.9 cm
Single Plane A2C EF: 65.6 %
Single Plane A4C EF: 63.3 %

## 2020-11-09 LAB — LIPID PANEL
Cholesterol: 144 mg/dL (ref 0–200)
HDL: 54 mg/dL (ref >40)
LDL Cholesterol: 71 mg/dL (ref 0–99)
Total CHOL/HDL Ratio: 2.7 RATIO
Triglycerides: 93 mg/dL (ref <150)
VLDL: 19 mg/dL (ref 0–40)

## 2020-11-09 LAB — CREATININE, SERUM
Creatinine, Ser: 0.86 mg/dL (ref 0.44–1.00)
GFR, Estimated: >60 mL/min (ref >60)

## 2020-11-09 LAB — RESP PANEL BY RT-PCR (FLU A&B, COVID) ARPGX2
Influenza A by PCR: NEGATIVE
Influenza B by PCR: NEGATIVE
SARS Coronavirus 2 by RT PCR: NEGATIVE

## 2020-11-09 LAB — HIV ANTIBODY (ROUTINE TESTING W REFLEX): HIV Screen 4th Generation wRfx: NONREACTIVE

## 2020-11-09 LAB — HEMOGLOBIN A1C
Hgb A1c MFr Bld: 5.4 % (ref 4.8–5.6)
Mean Plasma Glucose: 108.28 mg/dL

## 2020-11-09 MED ORDER — CLOPIDOGREL BISULFATE 75 MG PO TABS: 75.0000 mg | ORAL_TABLET | Freq: Every day | ORAL | 1 refills | Status: DC

## 2020-11-09 MED ORDER — ACETAMINOPHEN 160 MG/5ML PO SOLN: 650.0000 mg | ORAL | Status: DC | PRN

## 2020-11-09 MED ORDER — TICAGRELOR 90 MG PO TABS
180.0000 mg | ORAL_TABLET | Freq: Once | ORAL | Status: AC
  Administered 2020-11-09: 180 mg via ORAL
  Filled 2020-11-09: qty 2

## 2020-11-09 MED ORDER — ENOXAPARIN SODIUM 40 MG/0.4ML ~~LOC~~ SOLN
40.0000 mg | Freq: Every day | SUBCUTANEOUS | Status: DC
  Administered 2020-11-09: 40 mg via SUBCUTANEOUS
  Filled 2020-11-09: qty 0.4

## 2020-11-09 MED ORDER — ASPIRIN 300 MG RE SUPP: 300.0000 mg | Freq: Every day | RECTAL | Status: DC

## 2020-11-09 MED ORDER — TICAGRELOR 90 MG PO TABS: 90.0000 mg | ORAL_TABLET | Freq: Two times a day (BID) | ORAL | Status: DC

## 2020-11-09 MED ORDER — TICAGRELOR 90 MG PO TABS: 90.0000 mg | ORAL_TABLET | Freq: Two times a day (BID) | ORAL | 0 refills | Status: DC

## 2020-11-09 MED ORDER — ASPIRIN 325 MG PO TABS
325.0000 mg | ORAL_TABLET | Freq: Every day | ORAL | Status: DC
  Administered 2020-11-09: 325 mg via ORAL
  Filled 2020-11-09: qty 1

## 2020-11-09 MED ORDER — SODIUM CHLORIDE 0.9 % IV SOLN: INTRAVENOUS | Status: DC

## 2020-11-09 MED ORDER — EZETIMIBE 10 MG PO TABS
10.0000 mg | ORAL_TABLET | Freq: Every day | ORAL | Status: DC
  Administered 2020-11-09: 10 mg via ORAL
  Filled 2020-11-09: qty 1

## 2020-11-09 MED ORDER — SENNOSIDES-DOCUSATE SODIUM 8.6-50 MG PO TABS: 1.0000 | ORAL_TABLET | Freq: Every evening | ORAL | Status: DC | PRN

## 2020-11-09 MED ORDER — ACETAMINOPHEN 650 MG RE SUPP: 650.0000 mg | RECTAL | Status: DC | PRN

## 2020-11-09 MED ORDER — STROKE: EARLY STAGES OF RECOVERY BOOK
Freq: Once | Status: DC
  Filled 2020-11-09: qty 1

## 2020-11-09 MED ORDER — ACETAMINOPHEN 325 MG PO TABS
650.0000 mg | ORAL_TABLET | ORAL | Status: DC | PRN
  Administered 2020-11-09 (×2): 650 mg via ORAL
  Filled 2020-11-09 (×2): qty 2

## 2020-11-09 MED ORDER — IOHEXOL 350 MG/ML SOLN
75.0000 mL | Freq: Once | INTRAVENOUS | Status: AC | PRN
  Administered 2020-11-09: 75 mL via INTRAVENOUS

## 2020-11-09 NOTE — Progress Notes (Signed)
VASCULAR LAB    Carotid duplex has been performed.  See CV proc for preliminary results.   Shawne Bulow, RVT 11/09/2020, 9:14 AM

## 2020-11-09 NOTE — ED Notes (Signed)
IV site nml to RAC with ns at 100cc/hr

## 2020-11-09 NOTE — Progress Notes (Signed)
Pt discharged and wheeled off unit by NT. Both IV's removed and CCMD notified of pt's DC. Al pt's belongings were in her lap in a pt belongings bag.

## 2020-11-09 NOTE — Consult Note (Signed)
Case discussed with Dr. Pearlean Brownie.  This is a 60 year old female who presented with left leg numbness and weakness.  Her work up included a MRI which was negative, a carotid duplex with 60-79 % right carotid stenosis and a CTA neck which showed minimal right carotid stenosis.  Therefore after reviewing her images, I feel that the u/s overestimated the degree of stenosis, and with less than 50% right carotid stenosis, I would not recommend carotid intervention at this time.  The plan per Dr. Pearlean Brownie would be to change her Plavix to Brilinta and add a statin.  I will have her follow up with me in the office in 1 month   Alyssa Ritter

## 2020-11-09 NOTE — Progress Notes (Signed)
Pt arrived on unit. Pt is oriented to the unit with bed locked at the lowest position. Call bell is within reach.

## 2020-11-09 NOTE — ED Notes (Signed)
Transport svc in to take pt to echo/vasc.  I spoke with Candace, UST and informed her to please transfer pt to [facility] upon completion

## 2020-11-09 NOTE — Progress Notes (Signed)
STROKE TEAM PROGRESS NOTE    INTERVAL HISTORY No acute events since arrival.  Got out of bed and tried to stand with full weakness and numbness/loss of sensation from hip down as if it was not there. She fell. Leg symptoms resolved within a few minutes.  +headache in occipital since last Tuesday. Today she denies any new concerns and continues to report complete resolution of her symptoms.   We discussed TIA and carotid stenosis diagnosis, work up and plan of care including vacular surgery consult  with patient and husband (via speaker phone). Questions were answered.   Vitals:   11/09/20 0616 11/09/20 0645 11/09/20 0944 11/09/20 1117  BP:  (!) 124/94 (!) 157/69 (!) 185/74  Pulse:  (!) 49 (!) 57 60  Resp:  Temp: 98.4 F (36.9 C)  97.9 F (36.6 C) 97.7 F (36.5 C)  TempSrc:   Oral Oral  SpO2:  95% 98% 100%   CBC:  Recent Labs  Lab 11/08/20 1713 11/08/20 1731 11/09/20 0149  WBC 7.8  --  8.7  NEUTROABS 3.7  --   --   HGB 14.0 14.6 13.5  HCT 43.0 43.0 39.7  MCV 89.2  --  86.9  PLT 280  --  250   Basic Metabolic Panel:  Recent Labs  Lab 11/08/20 1713 11/08/20 1731 11/09/20 0149  NA 140 143  --   K 3.9 4.0  --   CL 105 104  --   CO2 28  --   --   GLUCOSE 89 86  --   BUN 13 15  --   CREATININE 1.01* 1.00 0.86  CALCIUM 9.4  --   --    Lipid Panel:  Recent Labs  Lab 11/09/20 0149  CHOL 144  TRIG 93  HDL 54  CHOLHDL 2.7  VLDL 19  LDLCALC 71   HgbA1c:  Recent Labs  Lab 11/09/20 0149  HGBA1C 5.4   Urine Drug Screen: No results for input(s): LABOPIA, COCAINSCRNUR, LABBENZ, AMPHETMU, THCU, LABBARB in the last 168 hours.  Alcohol Level No results for input(s): ETH in the last 168 hours.  IMAGING past 24 hours CT ANGIO HEAD W OR WO CONTRAST  Result Date: 11/09/2020 CLINICAL DATA:  Stroke. EXAM: CT ANGIOGRAPHY HEAD AND NECK TECHNIQUE: Multidetector CT imaging of the head and neck was performed using the standard protocol during bolus administration of  intravenous contrast. Multiplanar CT image reconstructions and MIPs were obtained to evaluate the vascular anatomy. Carotid stenosis measurements (when applicable) are obtained utilizing NASCET criteria, using the distal internal carotid diameter as the denominator. CONTRAST:  75mL OMNIPAQUE IOHEXOL 350 MG/ML SOLN COMPARISON:  Head MRI and MRA 11/08/2020 FINDINGS: CT HEAD FINDINGS Brain: There is no evidence of an acute infarct, intracranial hemorrhage, mass, midline shift, or extra-axial fluid collection. The ventricles and sulci are within normal limits for age. A dilated perivascular space is again noted inferiorly in the left basal ganglia. Vascular: Calcified atherosclerosis at the skull base. Skull: No fracture or suspicious osseous lesion. Sinuses: Visualized paranasal sinuses and mastoid air cells are clear. Orbits: Unremarkable. Review of the MIP images confirms the above findings CTA NECK FINDINGS Aortic arch: Normal variant aortic arch branching pattern with common origin of the brachiocephalic and left common carotid arteries. Mild atherosclerosis without significant arch vessel origin stenosis. Right carotid system: Patent with mild calcified plaque at the carotid bifurcation. No evidence of significant stenosis or dissection. Left carotid system: Patent with calcified plaque at the carotid  bifurcation resulting in less than 50% stenosis of the distal common carotid artery. No ICA stenosis or evidence of dissection. Vertebral arteries: Patent and codominant without evidence of stenosis or dissection. Skeleton: Mild cervical facet arthrosis. Other neck: No evidence of cervical lymphadenopathy or mass. Upper chest: No apical lung consolidation or mass. Review of the MIP images confirms the above findings CTA HEAD FINDINGS Anterior circulation: The internal carotid arteries are patent from skull base to carotid termini with mild atherosclerotic plaque on the left not resulting in significant stenosis.  There is a 1.5 mm outpouching from the left supraclinoid ICA in the posterior communicating region without a visible vessel arising from it. ACAs and MCAs are patent without evidence of a proximal branch occlusion or significant proximal stenosis. Posterior circulation: The intracranial vertebral arteries are widely patent to the basilar. Patent PICA and SCA origins are seen bilaterally. The basilar artery is widely patent. There is a large right posterior communicating artery with hypoplastic right P1 segment. Both PCAs are patent without evidence of a significant proximal stenosis. No aneurysm is identified. Venous sinuses: Patent. Anatomic variants: Predominantly fetal origin of the right PCA. Review of the MIP images confirms the above findings IMPRESSION: 1. Mild atherosclerosis in the head and neck without large vessel occlusion or significant proximal stenosis. 2. 1.5 mm left supraclinoid ICA infundibulum versus aneurysm. 3. Aortic Atherosclerosis (ICD10-I70.0). Electronically Signed   By: Sebastian Ache M.D.   On: 11/09/2020 12:35   CT HEAD WO CONTRAST  Result Date: 11/08/2020 CLINICAL DATA:  Transient ischemic attack (TIA) EXAM: CT HEAD WITHOUT CONTRAST TECHNIQUE: Contiguous axial images were obtained from the base of the skull through the vertex without intravenous contrast. COMPARISON:  None. FINDINGS: Brain: Mild generalized atrophy, slightly age advanced. No intracranial hemorrhage, mass effect, or midline shift. No hydrocephalus. The basilar cisterns are patent. No evidence of territorial infarct or acute ischemia. No extra-axial or intracranial fluid collection. Vascular: Atherosclerosis of skullbase vasculature without hyperdense vessel or abnormal calcification. Skull: No fracture or focal lesion. Sinuses/Orbits: Paranasal sinuses and mastoid air cells are clear. The visualized orbits are unremarkable. Other: None. IMPRESSION: 1. No acute intracranial abnormality. 2. Mild generalized atrophy,  slightly age advanced. Electronically Signed   By: Narda Rutherford M.D.   On: 11/08/2020 19:15   CT ANGIO NECK W OR WO CONTRAST  Result Date: 11/09/2020 CLINICAL DATA:  Stroke. EXAM: CT ANGIOGRAPHY HEAD AND NECK TECHNIQUE: Multidetector CT imaging of the head and neck was performed using the standard protocol during bolus administration of intravenous contrast. Multiplanar CT image reconstructions and MIPs were obtained to evaluate the vascular anatomy. Carotid stenosis measurements (when applicable) are obtained utilizing NASCET criteria, using the distal internal carotid diameter as the denominator. CONTRAST:  30mL OMNIPAQUE IOHEXOL 350 MG/ML SOLN COMPARISON:  Head MRI and MRA 11/08/2020 FINDINGS: CT HEAD FINDINGS Brain: There is no evidence of an acute infarct, intracranial hemorrhage, mass, midline shift, or extra-axial fluid collection. The ventricles and sulci are within normal limits for age. A dilated perivascular space is again noted inferiorly in the left basal ganglia. Vascular: Calcified atherosclerosis at the skull base. Skull: No fracture or suspicious osseous lesion. Sinuses: Visualized paranasal sinuses and mastoid air cells are clear. Orbits: Unremarkable. Review of the MIP images confirms the above findings CTA NECK FINDINGS Aortic arch: Normal variant aortic arch branching pattern with common origin of the brachiocephalic and left common carotid arteries. Mild atherosclerosis without significant arch vessel origin stenosis. Right carotid system: Patent with mild calcified plaque  at the carotid bifurcation. No evidence of significant stenosis or dissection. Left carotid system: Patent with calcified plaque at the carotid bifurcation resulting in less than 50% stenosis of the distal common carotid artery. No ICA stenosis or evidence of dissection. Vertebral arteries: Patent and codominant without evidence of stenosis or dissection. Skeleton: Mild cervical facet arthrosis. Other neck: No  evidence of cervical lymphadenopathy or mass. Upper chest: No apical lung consolidation or mass. Review of the MIP images confirms the above findings CTA HEAD FINDINGS Anterior circulation: The internal carotid arteries are patent from skull base to carotid termini with mild atherosclerotic plaque on the left not resulting in significant stenosis. There is a 1.5 mm outpouching from the left supraclinoid ICA in the posterior communicating region without a visible vessel arising from it. ACAs and MCAs are patent without evidence of a proximal branch occlusion or significant proximal stenosis. Posterior circulation: The intracranial vertebral arteries are widely patent to the basilar. Patent PICA and SCA origins are seen bilaterally. The basilar artery is widely patent. There is a large right posterior communicating artery with hypoplastic right P1 segment. Both PCAs are patent without evidence of a significant proximal stenosis. No aneurysm is identified. Venous sinuses: Patent. Anatomic variants: Predominantly fetal origin of the right PCA. Review of the MIP images confirms the above findings IMPRESSION: 1. Mild atherosclerosis in the head and neck without large vessel occlusion or significant proximal stenosis. 2. 1.5 mm left supraclinoid ICA infundibulum versus aneurysm. 3. Aortic Atherosclerosis (ICD10-I70.0). Electronically Signed   By: Sebastian Ache M.D.   On: 11/09/2020 12:35   MR ANGIO HEAD WO CONTRAST  Result Date: 11/09/2020 CLINICAL DATA:  Right lower extremity weakness.  Occipital pain. EXAM: MRI HEAD WITHOUT CONTRAST MRA HEAD WITHOUT CONTRAST TECHNIQUE: Multiplanar, multiecho pulse sequences of the brain and surrounding structures were obtained without intravenous contrast. Angiographic images of the head were obtained using MRA technique without contrast. COMPARISON:  None. FINDINGS: MRI HEAD FINDINGS Brain: No acute infarct, mass effect or extra-axial collection. No acute or chronic hemorrhage.  Normal white matter signal, parenchymal volume and CSF spaces. The midline structures are normal. Incidentally noted enlarged perivascular space of the left lentiform nucleus. Vascular: Major flow voids are preserved. Skull and upper cervical spine: Normal calvarium and skull base. Visualized upper cervical spine and soft tissues are normal. Sinuses/Orbits:No paranasal sinus fluid levels or advanced mucosal thickening. No mastoid or middle ear effusion. Normal orbits. MRA HEAD FINDINGS POSTERIOR CIRCULATION: --Vertebral arteries: Normal --Inferior cerebellar arteries: Normal. --Basilar artery: Normal. --Superior cerebellar arteries: Normal. --Posterior cerebral arteries: Normal. ANTERIOR CIRCULATION: --Intracranial internal carotid arteries: Normal. --Anterior cerebral arteries (ACA): Normal. --Middle cerebral arteries (MCA): Normal. ANATOMIC VARIANTS: Fetal origin of the right PCA. IMPRESSION: Normal MRI/MRA of the brain. Electronically Signed   By: Deatra Robinson M.D.   On: 11/09/2020 00:13   MR Brain Wo Contrast (neuro protocol)  Result Date: 11/09/2020 CLINICAL DATA:  Right lower extremity weakness.  Occipital pain. EXAM: MRI HEAD WITHOUT CONTRAST MRA HEAD WITHOUT CONTRAST TECHNIQUE: Multiplanar, multiecho pulse sequences of the brain and surrounding structures were obtained without intravenous contrast. Angiographic images of the head were obtained using MRA technique without contrast. COMPARISON:  None. FINDINGS: MRI HEAD FINDINGS Brain: No acute infarct, mass effect or extra-axial collection. No acute or chronic hemorrhage. Normal white matter signal, parenchymal volume and CSF spaces. The midline structures are normal. Incidentally noted enlarged perivascular space of the left lentiform nucleus. Vascular: Major flow voids are preserved. Skull and upper cervical spine:  Normal calvarium and skull base. Visualized upper cervical spine and soft tissues are normal. Sinuses/Orbits:No paranasal sinus fluid  levels or advanced mucosal thickening. No mastoid or middle ear effusion. Normal orbits. MRA HEAD FINDINGS POSTERIOR CIRCULATION: --Vertebral arteries: Normal --Inferior cerebellar arteries: Normal. --Basilar artery: Normal. --Superior cerebellar arteries: Normal. --Posterior cerebral arteries: Normal. ANTERIOR CIRCULATION: --Intracranial internal carotid arteries: Normal. --Anterior cerebral arteries (ACA): Normal. --Middle cerebral arteries (MCA): Normal. ANATOMIC VARIANTS: Fetal origin of the right PCA. IMPRESSION: Normal MRI/MRA of the brain. Electronically Signed   By: Deatra Robinson M.D.   On: 11/09/2020 00:13   DG Foot Complete Right  Result Date: 11/08/2020 CLINICAL DATA:  Larey Seat, dorsal right foot swelling and bruising EXAM: RIGHT FOOT COMPLETE - 3+ VIEW COMPARISON:  None. FINDINGS: Frontal, oblique, and lateral views of the right foot are obtained. No acute fracture, subluxation, or dislocation. Joint spaces are well preserved. Marked dorsal soft tissue swelling of the forefoot. IMPRESSION: 1. Dorsal soft tissue swelling of the forefoot. No underlying fracture. Electronically Signed   By: Sharlet Salina M.D.   On: 11/08/2020 21:49   ECHOCARDIOGRAM COMPLETE  Result Date: 11/09/2020    ECHOCARDIOGRAM REPORT   Patient Name:   ADALYN PENNOCK Blissett Date of Exam: 11/09/2020 Medical Rec #:  [id number]             Height:       63.0 in Accession #:    [id number]            Weight:       143.0 lb Date of Birth:  12/07/1960             BSA:          1.677 m Patient Age:    59 years              BP:           124/94 mmHg Patient Gender: F                     HR:           57 bpm. Exam Location:  Inpatient Procedure: 2D Echo, Cardiac Doppler, Color Doppler and Strain Analysis Indications:    CVA  History:        Patient has no prior history of Echocardiogram examinations.                 CAD, TIA; Risk Factors:Dyslipidemia.  Sonographer:    Neomia Dear RDCS Referring Phys: [id number] MOHAMMAD L GARBA IMPRESSIONS  1.  Left ventricular ejection fraction, by estimation, is 60 to 65%. The left ventricle has normal function. The left ventricle has no regional wall motion abnormalities. Left ventricular diastolic parameters are consistent with Grade I diastolic dysfunction (impaired relaxation).  2. Right ventricular systolic function is normal. The right ventricular size is normal. Tricuspid regurgitation signal is inadequate for assessing PA pressure.  3. The mitral valve is grossly normal. Trivial mitral valve regurgitation. No evidence of mitral stenosis.  4. The aortic valve was not well visualized. Aortic valve regurgitation is not visualized. No aortic stenosis is present.  5. The inferior vena cava is normal in size with greater than 50% respiratory variability, suggesting right atrial pressure of 3 mmHg. Comparison(s): No prior Echocardiogram. Conclusion(s)/Recommendation(s): No intracardiac source of embolism detected on this transthoracic study. A transesophageal echocardiogram is recommended to exclude cardiac source of embolism if clinically indicated. FINDINGS  Left Ventricle: Left ventricular ejection fraction, by estimation, is 60 to  65%. The left ventricle has normal function. The left ventricle has no regional wall motion abnormalities. The left ventricular internal cavity size was normal in size. There is  no left ventricular hypertrophy. Left ventricular diastolic parameters are consistent with Grade I diastolic dysfunction (impaired relaxation). Right Ventricle: The right ventricular size is normal. No increase in right ventricular wall thickness. Right ventricular systolic function is normal. Tricuspid regurgitation signal is inadequate for assessing PA pressure. Left Atrium: Left atrial size was normal in size. Right Atrium: Right atrial size was normal in size. Pericardium: Trivial pericardial effusion is present. Presence of pericardial fat pad. Mitral Valve: The mitral valve is grossly normal. Trivial mitral  valve regurgitation. No evidence of mitral valve stenosis. Tricuspid Valve: The tricuspid valve is grossly normal. Tricuspid valve regurgitation is not demonstrated. No evidence of tricuspid stenosis. Aortic Valve: The aortic valve was not well visualized. Aortic valve regurgitation is not visualized. No aortic stenosis is present. Aortic valve mean gradient measures 4.0 mmHg. Aortic valve peak gradient measures 8.4 mmHg. Aortic valve area, by VTI measures 1.40 cm. Pulmonic Valve: The pulmonic valve was grossly normal. Pulmonic valve regurgitation is not visualized. No evidence of pulmonic stenosis. Aorta: The aortic root is normal in size and structure. Venous: The inferior vena cava is normal in size with greater than 50% respiratory variability, suggesting right atrial pressure of 3 mmHg. IAS/Shunts: The atrial septum is grossly normal.  LEFT VENTRICLE PLAX 2D LVIDd:         5.40 cm     Diastology LVIDs:         3.90 cm     LV e' medial:    6.96 cm/s LV PW:         0.80 cm     LV E/e' medial:  10.6 LV IVS:        0.80 cm     LV e' lateral:   6.96 cm/s LVOT diam:     1.60 cm     LV E/e' lateral: 10.6 LV SV:         45 LV SV Index:   27          2D Longitudinal Strain LVOT Area:     2.01 cm    2D Strain GLS Avg:     -19.7 %  LV Volumes (MOD) LV vol d, MOD A2C: 36.9 ml LV vol d, MOD A4C: 40.6 ml LV vol s, MOD A2C: 12.7 ml LV vol s, MOD A4C: 14.9 ml LV SV MOD A2C:     24.2 ml LV SV MOD A4C:     40.6 ml LV SV MOD BP:      24.8 ml RIGHT VENTRICLE RV S prime:     8.70 cm/s  PULMONARY VEINS TAPSE (M-mode): 1.4 cm     A Reversal Duration: 109.00 msec                            A Reversal Velocity: 43.70 cm/s                            Diastolic Velocity:  40.30 cm/s                            S/D Velocity:        1.40  Systolic Velocity:   55.30 cm/s LEFT ATRIUM             Index       RIGHT ATRIUM           Index LA diam:        3.50 cm 2.09 cm/m  RA Area:     10.40 cm LA Vol (A2C):   44.3  ml 26.42 ml/m RA Volume:   26.00 ml  15.51 ml/m LA Vol (A4C):   35.5 ml 21.17 ml/m LA Biplane Vol: 40.6 ml 24.21 ml/m  AORTIC VALVE                   PULMONIC VALVE AV Area (Vmax):    1.20 cm    PV Vmax:       0.97 m/s AV Area (Vmean):   1.19 cm    PV Vmean:      71.700 cm/s AV Area (VTI):     1.40 cm    PV VTI:        0.328 m AV Vmax:           145.00 cm/s PV Peak grad:  3.8 mmHg AV Vmean:          96.900 cm/s PV Mean grad:  2.0 mmHg AV VTI:            0.320 m AV Peak Grad:      8.4 mmHg AV Mean Grad:      4.0 mmHg LVOT Vmax:         86.20 cm/s LVOT Vmean:        57.200 cm/s LVOT VTI:          0.223 m LVOT/AV VTI ratio: 0.70  AORTA Ao Root diam: 2.80 cm Ao Asc diam:  2.20 cm MITRAL VALVE MV Area (PHT): 2.99 cm     SHUNTS MV Decel Time: 254 msec     Systemic VTI:  0.22 m MV E velocity: 73.70 cm/s   Systemic Diam: 1.60 cm MV A velocity: 102.00 cm/s MV E/A ratio:  0.72 Lennie OdorWesley O'Neal MD Electronically signed by Lennie OdorWesley O'Neal MD Signature Date/Time: 11/09/2020/12:55:42 PM    Final    [facility]ID ([facility] and [facility] only)  Result Date: 11/09/2020 Carotid Arterial Duplex Study Indications:       Weakness. Risk Factors:      Hypertension, hyperlipidemia, past history of smoking,                    coronary artery disease, PAD. Comparison Study:  No prior study on file Performing Technologist: Sherren Kernsandace Kanady RVS  Examination Guidelines: A complete evaluation includes B-mode imaging, spectral Doppler, color Doppler, and power Doppler as needed of all accessible portions of each vessel. Bilateral testing is considered an integral part of a complete examination. Limited examinations for reoccurring indications may be performed as noted.  Right Carotid Findings: +----------+--------+--------+--------+------------------+------------------+           PSV cm/sEDV cm/sStenosisPlaque DescriptionComments           +----------+--------+--------+--------+------------------+------------------+ CCA Prox  140     24                                 intimal thickening +----------+--------+--------+--------+------------------+------------------+ CCA Distal105     31  intimal thickening +----------+--------+--------+--------+------------------+------------------+ ICA Prox  103     33              heterogenous      Shadowing          +----------+--------+--------+--------+------------------+------------------+ ICA Distal140     48              heterogenous                         +----------+--------+--------+--------+------------------+------------------+ ECA       145     14                                                   +----------+--------+--------+--------+------------------+------------------+ +----------+--------+-------+--------+-------------------+           PSV cm/sEDV cmsDescribeArm Pressure (mmHG) +----------+--------+-------+--------+-------------------+ [id number]                                        +----------+--------+-------+--------+-------------------+ +---------+--------+--+--------+--+ VertebralPSV cm/s70EDV cm/s21 +---------+--------+--+--------+--+  Left Carotid Findings: +----------+--------+--------+--------+------------------+------------------+           PSV cm/sEDV cm/sStenosisPlaque DescriptionComments           +----------+--------+--------+--------+------------------+------------------+ CCA Prox  110     26                                intimal thickening +----------+--------+--------+--------+------------------+------------------+ CCA Distal128     40              heterogenous                         +----------+--------+--------+--------+------------------+------------------+ ICA Prox  248     67      60-79%  calcific                             +----------+--------+--------+--------+------------------+------------------+ ICA Mid   177     49                                                    +----------+--------+--------+--------+------------------+------------------+ ICA Distal215     66                                                   +----------+--------+--------+--------+------------------+------------------+ ECA       146     15                                                   +----------+--------+--------+--------+------------------+------------------+ +----------+--------+--------+--------+-------------------+           PSV cm/sEDV cm/sDescribeArm Pressure (mmHG) +----------+--------+--------+--------+-------------------+ [id number]                                          +----------+--------+--------+--------+-------------------+ +---------+--------+---+--------+--+  VertebralPSV cm/s248EDV cm/s66 +---------+--------+---+--------+--+   Summary: Right Carotid: Velocities in the right ICA are consistent with a 1-39% stenosis. Left Carotid: Velocities in the left ICA are consistent with a 60-79% stenosis. Vertebrals:  Bilateral vertebral arteries demonstrate antegrade flow. Subclavians: Normal flow hemodynamics were seen in bilateral subclavian              arteries. *See table(s) above for measurements and observations.  Electronically signed by Delia Heady MD on 11/09/2020 at 12:52:20 PM.    Final     PHYSICAL EXAM General: Middle aged female sitting up in bed in NAD HEENT: [place]/AT Lungs: No extra work of breathing  Ext: Bruising and swelling to dorsal aspect of right foot Psych: Pleasant and cooperative  Neurologic Examination: Mental Status: Alert, oriented x4. t.  Speech fluent without evidence of aphasia.  Able to follow all commands without difficulty. Cranial Nerves: II:  PERRL VFF III,IV, VI: No ptosis. EOMI with saccadic quality to pursuits noted.  V,VII: Temp sensation equal bilaterally. Smile symmetric.  VIII: Hearing intact to voice IX,X: No hypophonia XI: Symmetric XII: Midline tongue extension  Motor: Full strength bilat  UE and LE throughout                                             Normal tone throughout; no atrophy noted No pronator drift  Sensory: Temp and light touch sensation intact in all 4 extremities. No extinction to DSS.  Plantars: Right: downgoing               Left: downgoing Cerebellar: No ataxia with FNF bilaterally Gait: Deferred  ASSESSMENT/PLAN Ms. Edyn Popoca is a 60 y.o. 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 Dulce Sellar and PVD    Stroke like episode which is consistent with possible limb shaking TIA in the setting of l moderate eft calcific carotid stenosis on ultrasound.,  However CTangiogram.    Neck has discordant findings showing no significant stenosis of the left ICA   code Stroke HCT was without acute findings   CTA head & neck is mild atherosclerosis of head and neck without large vessel occlusion or proximal stenosis.    MRI without acute findings  Carotid Doppler  Left carotid stenosis 60-79%. Vascular surgeon, Dr. Myra Gianotti, consulted.   2D Echo left ventricular ejection fraction of 60 to 65%.  ASA  LDL 71  HgbA1c 5.4  VTE prophylaxis Lovenox 40mg      Diet   Diet Heart Room service appropriate? Yes; Fluid consistency: Thin    On ASA 81mg  and Plavix PTA, Currently on ASA 325mg  pending vascular surgery plan  Therapy recommendations:  Cleared for home   Disposition:  Home   BP management  BP goal normotensive  Hyperlipidemia  Per chart review of cardiology notes well known all statin intolerance on Repatha therapy  Home meds:  Repatha   LDL 71 essentially at  goal < 70   Other Stroke Risk Factors   Family hx stroke (Brother)  Coronary artery disease  Migraines   Other Active Problems  None  Hospital day # 0 I have personally  obtained history,examined this patient, reviewed notes, independently viewed imaging studies, participated in medical decision  making and plan of care.ROS completed by me personally and pertinent positives fully documented  I have made any additions or clarifications directly to the above note. Agree with note above.  She presented with transient sudden onset of right leg weakness and numbness possibly of left brain TIA and carotid ultrasound showed calcific left carotid moderate 60 to 70% stenosis however this did not confirm this.Recommend vascular surgery consult to consider elective revascularization versus continue aggressive medical management.  Recommend aspirin and Plavix which she was already on if revascularization is not considered may consider switching to aspirin and Brilinta if her insurance will cover it.his.  Maintain aggressive risk factor modification.  Greater than 50% time during this 35-minute visit was spent on counseling Delia Heady, MD Medical Director [facility] Pager: [phone] 11/09/2020 1:14 PM   To contact Stroke Continuity provider, please refer to [email or web address]. After hours, contact General Neurology

## 2020-11-09 NOTE — Discharge Summary (Signed)
Physician Discharge Summary  Alyssa Ritter WUJ:[id number] DOB: 09/14/1960 DOA: 11/08/2020  PCP: Lonie Peak, PA-C  Admit date: 11/08/2020 Discharge date: 11/09/2020  Admitted From: Home  Discharge disposition: Home  Recommendations for Outpatient Follow-Up:   . Follow up with your primary care provider in one week.  . Check CBC, BMP, magnesium in the next visit . Follow-up with with [facility] neurology as scheduled by the clinic for follow-up of TIA.. . Follow-up with vascular surgery for follow-up of carotid stenosis. . Patient has been prescribed Brilinta instead of Plavix.  Please continue Brilinta if possible beyond 30 days.  If unable to prescribe brillinta beyond 30 days, please resume Plavix  Discharge Diagnosis:   Principal Problem:   TIA (transient ischemic attack) Active Problems:   Peripheral vascular disease of extremity (HCC)   Hypercholesteremia   Benign essential tremor   Anxiety disorder   CAD (coronary artery disease)   Discharge Condition: Improved.  Diet recommendation: Low sodium, heart healthy.    Wound care: None.  Code status: Full.   History of Present Illness:   Alyssa Ritter is a 60 y.o. female with medical history significant of coronary artery disease, anxiety disorder, peripheral vascular disease, hyperlipidemia, depression anxiety, benign essential tremors and hypertension presented to hospital with a right lower extremity weakness followed by fall.  In the ED, and remained hemodynamically stable.  CBC, chemistry was normal.  CT head scan was negative.  EKG showed normal sinus rhythm.  Patient was then placed in hospital for TIA work-up.   Hospital Course:   Following conditions were addressed during hospitalization as listed below,  TIA:  Patient was seen by neurology.   At home so neurology recommended aspirin and Brilinta on discharge.  30-day coupon for Brilinta was given.  Carotid duplex initially showed significant  stenosis so CTA was done which showed less than 50% stenosis.  Vascular surgery recommended outpatient follow-up for this finding.  MRI of the brain did not show any evidence of acute stroke.  Hyperlipidemia:  Patient is on Repatha at home.  Allergic to statins.  Anxiety disorder:  On Lexapro at home.  Benign essential tremors  Coronary artery disease: Appears compensated.  Continue to monitor  Peripheral vascular disease: Stable.  Continue Brilinta on discharge.  Disposition.  At this time, patient is stable for disposition home with outpatient PCP, neurology and vascular surgery follow-up.  Medical Consultants:    Neurology  Vascular surgery  Procedures:   MRI of the brain, carotid duplex ultrasound Subjective:   Today, patient was seen and examined at bedside.  Denies any dizziness lightheadedness or focal weakness.  Discharge Exam:   Vitals:   11/09/20 0944 11/09/20 1117  BP: (!) 157/69 (!) 185/74  Pulse: (!) 57 60  Resp: 18 18  Temp: 97.9 F (36.6 C) 97.7 F (36.5 C)  SpO2: 98% 100%   Vitals:   11/09/20 0616 11/09/20 0645 11/09/20 0944 11/09/20 1117  BP:  (!) 124/94 (!) 157/69 (!) 185/74  Pulse:  (!) 49 (!) 57 60  Resp:  Temp: 98.4 F (36.9 C)  97.9 F (36.6 C) 97.7 F (36.5 C)  TempSrc:   Oral Oral  SpO2:  95% 98% 100%   General: Alert awake, not in obvious distress HENT: pupils equally reacting to light,  No scleral pallor or icterus noted. Oral mucosa is moist.  Chest:  Clear breath sounds.  Diminished breath sounds bilaterally. No crackles or wheezes.  CVS: S1 &S2 heard.  No murmur.  Regular rate and rhythm. Abdomen: Soft, nontender, nondistended.  Bowel sounds are heard.   Extremities: No cyanosis, clubbing or edema.  Peripheral pulses are palpable. Psych: Alert, awake and oriented, normal mood CNS:  No cranial nerve deficits.  Power equal in all extremities.   Skin: Warm and dry.  No rashes noted.  The results of significant  diagnostics from this hospitalization (including imaging, microbiology, ancillary and laboratory) are listed below for reference.     Diagnostic Studies:   CT ANGIO HEAD W OR WO CONTRAST  Result Date: 11/09/2020 CLINICAL DATA:  Stroke. EXAM: CT ANGIOGRAPHY HEAD AND NECK TECHNIQUE: Multidetector CT imaging of the head and neck was performed using the standard protocol during bolus administration of intravenous contrast. Multiplanar CT image reconstructions and MIPs were obtained to evaluate the vascular anatomy. Carotid stenosis measurements (when applicable) are obtained utilizing NASCET criteria, using the distal internal carotid diameter as the denominator. CONTRAST:  66mL OMNIPAQUE IOHEXOL 350 MG/ML SOLN COMPARISON:  Head MRI and MRA 11/08/2020 FINDINGS: CT HEAD FINDINGS Brain: There is no evidence of an acute infarct, intracranial hemorrhage, mass, midline shift, or extra-axial fluid collection. The ventricles and sulci are within normal limits for age. A dilated perivascular space is again noted inferiorly in the left basal ganglia. Vascular: Calcified atherosclerosis at the skull base. Skull: No fracture or suspicious osseous lesion. Sinuses: Visualized paranasal sinuses and mastoid air cells are clear. Orbits: Unremarkable. Review of the MIP images confirms the above findings CTA NECK FINDINGS Aortic arch: Normal variant aortic arch branching pattern with common origin of the brachiocephalic and left common carotid arteries. Mild atherosclerosis without significant arch vessel origin stenosis. Right carotid system: Patent with mild calcified plaque at the carotid bifurcation. No evidence of significant stenosis or dissection. Left carotid system: Patent with calcified plaque at the carotid bifurcation resulting in less than 50% stenosis of the distal common carotid artery. No ICA stenosis or evidence of dissection. Vertebral arteries: Patent and codominant without evidence of stenosis or dissection.  Skeleton: Mild cervical facet arthrosis. Other neck: No evidence of cervical lymphadenopathy or mass. Upper chest: No apical lung consolidation or mass. Review of the MIP images confirms the above findings CTA HEAD FINDINGS Anterior circulation: The internal carotid arteries are patent from skull base to carotid termini with mild atherosclerotic plaque on the left not resulting in significant stenosis. There is a 1.5 mm outpouching from the left supraclinoid ICA in the posterior communicating region without a visible vessel arising from it. ACAs and MCAs are patent without evidence of a proximal branch occlusion or significant proximal stenosis. Posterior circulation: The intracranial vertebral arteries are widely patent to the basilar. Patent PICA and SCA origins are seen bilaterally. The basilar artery is widely patent. There is a large right posterior communicating artery with hypoplastic right P1 segment. Both PCAs are patent without evidence of a significant proximal stenosis. No aneurysm is identified. Venous sinuses: Patent. Anatomic variants: Predominantly fetal origin of the right PCA. Review of the MIP images confirms the above findings IMPRESSION: 1. Mild atherosclerosis in the head and neck without large vessel occlusion or significant proximal stenosis. 2. 1.5 mm left supraclinoid ICA infundibulum versus aneurysm. 3. Aortic Atherosclerosis (ICD10-I70.0). Electronically Signed   By: Sebastian Ache M.D.   On: 11/09/2020 12:35   CT HEAD WO CONTRAST  Result Date: 11/08/2020 CLINICAL DATA:  Transient ischemic attack (TIA) EXAM: CT HEAD WITHOUT CONTRAST TECHNIQUE: Contiguous axial images were obtained from the base of the skull through  the vertex without intravenous contrast. COMPARISON:  None. FINDINGS: Brain: Mild generalized atrophy, slightly age advanced. No intracranial hemorrhage, mass effect, or midline shift. No hydrocephalus. The basilar cisterns are patent. No evidence of territorial infarct or  acute ischemia. No extra-axial or intracranial fluid collection. Vascular: Atherosclerosis of skullbase vasculature without hyperdense vessel or abnormal calcification. Skull: No fracture or focal lesion. Sinuses/Orbits: Paranasal sinuses and mastoid air cells are clear. The visualized orbits are unremarkable. Other: None. IMPRESSION: 1. No acute intracranial abnormality. 2. Mild generalized atrophy, slightly age advanced. Electronically Signed   By: Narda Rutherford M.D.   On: 11/08/2020 19:15   CT ANGIO NECK W OR WO CONTRAST  Result Date: 11/09/2020 CLINICAL DATA:  Stroke. EXAM: CT ANGIOGRAPHY HEAD AND NECK TECHNIQUE: Multidetector CT imaging of the head and neck was performed using the standard protocol during bolus administration of intravenous contrast. Multiplanar CT image reconstructions and MIPs were obtained to evaluate the vascular anatomy. Carotid stenosis measurements (when applicable) are obtained utilizing NASCET criteria, using the distal internal carotid diameter as the denominator. CONTRAST:  75mL OMNIPAQUE IOHEXOL 350 MG/ML SOLN COMPARISON:  Head MRI and MRA 11/08/2020 FINDINGS: CT HEAD FINDINGS Brain: There is no evidence of an acute infarct, intracranial hemorrhage, mass, midline shift, or extra-axial fluid collection. The ventricles and sulci are within normal limits for age. A dilated perivascular space is again noted inferiorly in the left basal ganglia. Vascular: Calcified atherosclerosis at the skull base. Skull: No fracture or suspicious osseous lesion. Sinuses: Visualized paranasal sinuses and mastoid air cells are clear. Orbits: Unremarkable. Review of the MIP images confirms the above findings CTA NECK FINDINGS Aortic arch: Normal variant aortic arch branching pattern with common origin of the brachiocephalic and left common carotid arteries. Mild atherosclerosis without significant arch vessel origin stenosis. Right carotid system: Patent with mild calcified plaque at the carotid  bifurcation. No evidence of significant stenosis or dissection. Left carotid system: Patent with calcified plaque at the carotid bifurcation resulting in less than 50% stenosis of the distal common carotid artery. No ICA stenosis or evidence of dissection. Vertebral arteries: Patent and codominant without evidence of stenosis or dissection. Skeleton: Mild cervical facet arthrosis. Other neck: No evidence of cervical lymphadenopathy or mass. Upper chest: No apical lung consolidation or mass. Review of the MIP images confirms the above findings CTA HEAD FINDINGS Anterior circulation: The internal carotid arteries are patent from skull base to carotid termini with mild atherosclerotic plaque on the left not resulting in significant stenosis. There is a 1.5 mm outpouching from the left supraclinoid ICA in the posterior communicating region without a visible vessel arising from it. ACAs and MCAs are patent without evidence of a proximal branch occlusion or significant proximal stenosis. Posterior circulation: The intracranial vertebral arteries are widely patent to the basilar. Patent PICA and SCA origins are seen bilaterally. The basilar artery is widely patent. There is a large right posterior communicating artery with hypoplastic right P1 segment. Both PCAs are patent without evidence of a significant proximal stenosis. No aneurysm is identified. Venous sinuses: Patent. Anatomic variants: Predominantly fetal origin of the right PCA. Review of the MIP images confirms the above findings IMPRESSION: 1. Mild atherosclerosis in the head and neck without large vessel occlusion or significant proximal stenosis. 2. 1.5 mm left supraclinoid ICA infundibulum versus aneurysm. 3. Aortic Atherosclerosis (ICD10-I70.0). Electronically Signed   By: Sebastian Ache M.D.   On: 11/09/2020 12:35   MR ANGIO HEAD WO CONTRAST  Result Date: 11/09/2020 CLINICAL DATA:  Right lower extremity weakness.  Occipital pain. EXAM: MRI HEAD WITHOUT  CONTRAST MRA HEAD WITHOUT CONTRAST TECHNIQUE: Multiplanar, multiecho pulse sequences of the brain and surrounding structures were obtained without intravenous contrast. Angiographic images of the head were obtained using MRA technique without contrast. COMPARISON:  None. FINDINGS: MRI HEAD FINDINGS Brain: No acute infarct, mass effect or extra-axial collection. No acute or chronic hemorrhage. Normal white matter signal, parenchymal volume and CSF spaces. The midline structures are normal. Incidentally noted enlarged perivascular space of the left lentiform nucleus. Vascular: Major flow voids are preserved. Skull and upper cervical spine: Normal calvarium and skull base. Visualized upper cervical spine and soft tissues are normal. Sinuses/Orbits:No paranasal sinus fluid levels or advanced mucosal thickening. No mastoid or middle ear effusion. Normal orbits. MRA HEAD FINDINGS POSTERIOR CIRCULATION: --Vertebral arteries: Normal --Inferior cerebellar arteries: Normal. --Basilar artery: Normal. --Superior cerebellar arteries: Normal. --Posterior cerebral arteries: Normal. ANTERIOR CIRCULATION: --Intracranial internal carotid arteries: Normal. --Anterior cerebral arteries (ACA): Normal. --Middle cerebral arteries (MCA): Normal. ANATOMIC VARIANTS: Fetal origin of the right PCA. IMPRESSION: Normal MRI/MRA of the brain. Electronically Signed   By: Deatra Robinson M.D.   On: 11/09/2020 00:13   MR Brain Wo Contrast (neuro protocol)  Result Date: 11/09/2020 CLINICAL DATA:  Right lower extremity weakness.  Occipital pain. EXAM: MRI HEAD WITHOUT CONTRAST MRA HEAD WITHOUT CONTRAST TECHNIQUE: Multiplanar, multiecho pulse sequences of the brain and surrounding structures were obtained without intravenous contrast. Angiographic images of the head were obtained using MRA technique without contrast. COMPARISON:  None. FINDINGS: MRI HEAD FINDINGS Brain: No acute infarct, mass effect or extra-axial collection. No acute or chronic  hemorrhage. Normal white matter signal, parenchymal volume and CSF spaces. The midline structures are normal. Incidentally noted enlarged perivascular space of the left lentiform nucleus. Vascular: Major flow voids are preserved. Skull and upper cervical spine: Normal calvarium and skull base. Visualized upper cervical spine and soft tissues are normal. Sinuses/Orbits:No paranasal sinus fluid levels or advanced mucosal thickening. No mastoid or middle ear effusion. Normal orbits. MRA HEAD FINDINGS POSTERIOR CIRCULATION: --Vertebral arteries: Normal --Inferior cerebellar arteries: Normal. --Basilar artery: Normal. --Superior cerebellar arteries: Normal. --Posterior cerebral arteries: Normal. ANTERIOR CIRCULATION: --Intracranial internal carotid arteries: Normal. --Anterior cerebral arteries (ACA): Normal. --Middle cerebral arteries (MCA): Normal. ANATOMIC VARIANTS: Fetal origin of the right PCA. IMPRESSION: Normal MRI/MRA of the brain. Electronically Signed   By: Deatra Robinson M.D.   On: 11/09/2020 00:13   DG Foot Complete Right  Result Date: 11/08/2020 CLINICAL DATA:  Larey Seat, dorsal right foot swelling and bruising EXAM: RIGHT FOOT COMPLETE - 3+ VIEW COMPARISON:  None. FINDINGS: Frontal, oblique, and lateral views of the right foot are obtained. No acute fracture, subluxation, or dislocation. Joint spaces are well preserved. Marked dorsal soft tissue swelling of the forefoot. IMPRESSION: 1. Dorsal soft tissue swelling of the forefoot. No underlying fracture. Electronically Signed   By: Sharlet Salina M.D.   On: 11/08/2020 21:49   ECHOCARDIOGRAM COMPLETE  Result Date: 11/09/2020    ECHOCARDIOGRAM REPORT   Patient Name:   Alyssa Ritter Date of Exam: 11/09/2020 Medical Rec #:  [id number]             Height:       63.0 in Accession #:    [id number]            Weight:       143.0 lb Date of Birth:  [birth year]             BSA:  1.677 m Patient Age:    59 years              BP:           124/94 mmHg  Patient Gender: F                     HR:           57 bpm. Exam Location:  Inpatient Procedure: 2D Echo, Cardiac Doppler, Color Doppler and Strain Analysis Indications:    CVA  History:        Patient has no prior history of Echocardiogram examinations.                 CAD, TIA; Risk Factors:Dyslipidemia.  Sonographer:    Neomia Dear RDCS Referring Phys: [id number] MOHAMMAD L GARBA IMPRESSIONS  1. Left ventricular ejection fraction, by estimation, is 60 to 65%. The left ventricle has normal function. The left ventricle has no regional wall motion abnormalities. Left ventricular diastolic parameters are consistent with Grade I diastolic dysfunction (impaired relaxation).  2. Right ventricular systolic function is normal. The right ventricular size is normal. Tricuspid regurgitation signal is inadequate for assessing PA pressure.  3. The mitral valve is grossly normal. Trivial mitral valve regurgitation. No evidence of mitral stenosis.  4. The aortic valve was not well visualized. Aortic valve regurgitation is not visualized. No aortic stenosis is present.  5. The inferior vena cava is normal in size with greater than 50% respiratory variability, suggesting right atrial pressure of 3 mmHg. Comparison(s): No prior Echocardiogram. Conclusion(s)/Recommendation(s): No intracardiac source of embolism detected on this transthoracic study. A transesophageal echocardiogram is recommended to exclude cardiac source of embolism if clinically indicated. FINDINGS  Left Ventricle: Left ventricular ejection fraction, by estimation, is 60 to 65%. The left ventricle has normal function. The left ventricle has no regional wall motion abnormalities. The left ventricular internal cavity size was normal in size. There is  no left ventricular hypertrophy. Left ventricular diastolic parameters are consistent with Grade I diastolic dysfunction (impaired relaxation). Right Ventricle: The right ventricular size is normal. No increase in right  ventricular wall thickness. Right ventricular systolic function is normal. Tricuspid regurgitation signal is inadequate for assessing PA pressure. Left Atrium: Left atrial size was normal in size. Right Atrium: Right atrial size was normal in size. Pericardium: Trivial pericardial effusion is present. Presence of pericardial fat pad. Mitral Valve: The mitral valve is grossly normal. Trivial mitral valve regurgitation. No evidence of mitral valve stenosis. Tricuspid Valve: The tricuspid valve is grossly normal. Tricuspid valve regurgitation is not demonstrated. No evidence of tricuspid stenosis. Aortic Valve: The aortic valve was not well visualized. Aortic valve regurgitation is not visualized. No aortic stenosis is present. Aortic valve mean gradient measures 4.0 mmHg. Aortic valve peak gradient measures 8.4 mmHg. Aortic valve area, by VTI measures 1.40 cm. Pulmonic Valve: The pulmonic valve was grossly normal. Pulmonic valve regurgitation is not visualized. No evidence of pulmonic stenosis. Aorta: The aortic root is normal in size and structure. Venous: The inferior vena cava is normal in size with greater than 50% respiratory variability, suggesting right atrial pressure of 3 mmHg. IAS/Shunts: The atrial septum is grossly normal.  LEFT VENTRICLE PLAX 2D LVIDd:         5.40 cm     Diastology LVIDs:         3.90 cm     LV e' medial:    6.96 cm/s LV PW:  0.80 cm     LV E/e' medial:  10.6 LV IVS:        0.80 cm     LV e' lateral:   6.96 cm/s LVOT diam:     1.60 cm     LV E/e' lateral: 10.6 LV SV:         45 LV SV Index:   27          2D Longitudinal Strain LVOT Area:     2.01 cm    2D Strain GLS Avg:     -19.7 %  LV Volumes (MOD) LV vol d, MOD A2C: 36.9 ml LV vol d, MOD A4C: 40.6 ml LV vol s, MOD A2C: 12.7 ml LV vol s, MOD A4C: 14.9 ml LV SV MOD A2C:     24.2 ml LV SV MOD A4C:     40.6 ml LV SV MOD BP:      24.8 ml RIGHT VENTRICLE RV S prime:     8.70 cm/s  PULMONARY VEINS TAPSE (M-mode): 1.4 cm     A  Reversal Duration: 109.00 msec                            A Reversal Velocity: 43.70 cm/s                            Diastolic Velocity:  40.30 cm/s                            S/D Velocity:        1.40                            Systolic Velocity:   55.30 cm/s LEFT ATRIUM             Index       RIGHT ATRIUM           Index LA diam:        3.50 cm 2.09 cm/m  RA Area:     10.40 cm LA Vol (A2C):   44.3 ml 26.42 ml/m RA Volume:   26.00 ml  15.51 ml/m LA Vol (A4C):   35.5 ml 21.17 ml/m LA Biplane Vol: 40.6 ml 24.21 ml/m  AORTIC VALVE                   PULMONIC VALVE AV Area (Vmax):    1.20 cm    PV Vmax:       0.97 m/s AV Area (Vmean):   1.19 cm    PV Vmean:      71.700 cm/s AV Area (VTI):     1.40 cm    PV VTI:        0.328 m AV Vmax:           145.00 cm/s PV Peak grad:  3.8 mmHg AV Vmean:          96.900 cm/s PV Mean grad:  2.0 mmHg AV VTI:            0.320 m AV Peak Grad:      8.4 mmHg AV Mean Grad:      4.0 mmHg LVOT Vmax:         86.20 cm/s LVOT Vmean:        57.200 cm/s LVOT VTI:  0.223 m LVOT/AV VTI ratio: 0.70  AORTA Ao Root diam: 2.80 cm Ao Asc diam:  2.20 cm MITRAL VALVE MV Area (PHT): 2.99 cm     SHUNTS MV Decel Time: 254 msec     Systemic VTI:  0.22 m MV E velocity: 73.70 cm/s   Systemic Diam: 1.60 cm MV A velocity: 102.00 cm/s MV E/A ratio:  0.72 Lennie OdorWesley O'Neal MD Electronically signed by Lennie OdorWesley O'Neal MD Signature Date/Time: 11/09/2020/12:55:42 PM    Final    VAS US CAROTID (at [facility] and [facility] only)  Result Date: 11/09/2020 Carotid Arterial Duplex Study Indications:       Weakness. Risk Factors:      Hypertension, hyperlipidemia, past history of smoking,                    coronary artery disease, PAD. Comparison Study:  No prior study on file Performing Technologist: Sherren Kernsandace Kanady RVS  Examination Guidelines: A complete evaluation includes B-mode imaging, spectral Doppler, color Doppler, and power Doppler as needed of all accessible portions of each vessel. Bilateral testing is considered  an integral part of a complete examination. Limited examinations for reoccurring indications may be performed as noted.  Right Carotid Findings: +----------+--------+--------+--------+------------------+------------------+           PSV cm/sEDV cm/sStenosisPlaque DescriptionComments           +----------+--------+--------+--------+------------------+------------------+ CCA Prox  140     24                                intimal thickening +----------+--------+--------+--------+------------------+------------------+ CCA Distal105     31                                intimal thickening +----------+--------+--------+--------+------------------+------------------+ ICA Prox  103     33              heterogenous      Shadowing          +----------+--------+--------+--------+------------------+------------------+ ICA Distal140     48              heterogenous                         +----------+--------+--------+--------+------------------+------------------+ ECA       145     14                                                   +----------+--------+--------+--------+------------------+------------------+ +----------+--------+-------+--------+-------------------+           PSV cm/sEDV cmsDescribeArm Pressure (mmHG) +----------+--------+-------+--------+-------------------+ [id number]                                        +----------+--------+-------+--------+-------------------+ +---------+--------+--+--------+--+ VertebralPSV cm/s70EDV cm/s21 +---------+--------+--+--------+--+  Left Carotid Findings: +----------+--------+--------+--------+------------------+------------------+           PSV cm/sEDV cm/sStenosisPlaque DescriptionComments           +----------+--------+--------+--------+------------------+------------------+ CCA Prox  110     26  intimal thickening  +----------+--------+--------+--------+------------------+------------------+ CCA Distal128     40              heterogenous                         +----------+--------+--------+--------+------------------+------------------+ ICA Prox  248     67      60-79%  calcific                             +----------+--------+--------+--------+------------------+------------------+ ICA Mid   177     49                                                   +----------+--------+--------+--------+------------------+------------------+ ICA Distal215     66                                                   +----------+--------+--------+--------+------------------+------------------+ ECA       146     15                                                   +----------+--------+--------+--------+------------------+------------------+ +----------+--------+--------+--------+-------------------+           PSV cm/sEDV cm/sDescribeArm Pressure (mmHG) +----------+--------+--------+--------+-------------------+ [id number]                                          +----------+--------+--------+--------+-------------------+ +---------+--------+---+--------+--+ VertebralPSV cm/s248EDV cm/s66 +---------+--------+---+--------+--+   Summary: Right Carotid: Velocities in the right ICA are consistent with a 1-39% stenosis. Left Carotid: Velocities in the left ICA are consistent with a 60-79% stenosis. Vertebrals:  Bilateral vertebral arteries demonstrate antegrade flow. Subclavians: Normal flow hemodynamics were seen in bilateral subclavian              arteries. *See table(s) above for measurements and observations.  Electronically signed by Delia Heady MD on 11/09/2020 at 12:52:20 PM.    Final      Labs:   Basic Metabolic Panel: Recent Labs  Lab 11/08/20 1713 11/08/20 1731 11/09/20 0149  NA 140 143  --   K 3.9 4.0  --   CL 105 104  --   CO2 28  --   --   GLUCOSE 89 86  --   BUN 13  15  --   CREATININE 1.01* 1.00 0.86  CALCIUM 9.4  --   --    GFR CrCl cannot be calculated (Unknown ideal weight.). Liver Function Tests: Recent Labs  Lab 11/08/20 1713  AST 25  ALT 12  ALKPHOS 83  BILITOT 0.8  PROT 7.3  ALBUMIN 4.1   No results for input(s): LIPASE, AMYLASE in the last 168 hours. No results for input(s): AMMONIA in the last 168 hours. Coagulation profile Recent Labs  Lab 11/08/20 1713  INR 1.0    CBC: Recent Labs  Lab 11/08/20 1713 11/08/20 1731 11/09/20 0149  WBC 7.8  --  8.7  NEUTROABS 3.7  --   --   HGB 14.0 14.6 13.5  HCT 43.0 43.0 39.7  MCV 89.2  --  86.9  PLT 280  --  250   Cardiac Enzymes: No results for input(s): CKTOTAL, CKMB, CKMBINDEX, TROPONINI in the last 168 hours. BNP: Invalid input(s): POCBNP CBG: No results for input(s): GLUCAP in the last 168 hours. D-Dimer No results for input(s): DDIMER in the last 72 hours. Hgb A1c Recent Labs    11/09/20 0149  HGBA1C 5.4   Lipid Profile Recent Labs    11/09/20 0149  CHOL 144  HDL 54  LDLCALC 71  TRIG 93  CHOLHDL 2.7   Thyroid function studies No results for input(s): TSH, T4TOTAL, T3FREE, THYROIDAB in the last 72 hours.  Invalid input(s): FREET3 Anemia work up No results for input(s): VITAMINB12, FOLATE, FERRITIN, TIBC, IRON, RETICCTPCT in the last 72 hours. Microbiology Recent Results (from the past 240 hour(s))  Resp Panel by RT-PCR (Flu A&B, Covid) Nasopharyngeal Swab     Status: None   Collection Time: 11/09/20  2:48 AM   Specimen: Nasopharyngeal Swab; Nasopharyngeal(NP) swabs in vial transport medium  Result Value Ref Range Status   SARS Coronavirus 2 by RT PCR NEGATIVE NEGATIVE Final    Comment: (NOTE) SARS-CoV-2 target nucleic acids are NOT DETECTED.  The SARS-CoV-2 RNA is generally detectable in upper respiratory specimens during the acute phase of infection. The lowest concentration of SARS-CoV-2 viral copies this assay can detect is 138 copies/mL. A  negative result does not preclude SARS-Cov-2 infection and should not be used as the sole basis for treatment or other patient management decisions. A negative result may occur with  improper specimen collection/handling, submission of specimen other than nasopharyngeal swab, presence of viral mutation(s) within the areas targeted by this assay, and inadequate number of viral copies(<138 copies/mL). A negative result must be combined with clinical observations, patient history, and epidemiological information. The expected result is Negative.  Fact Sheet for Patients:  BloggerCourse.com  Fact Sheet for Healthcare Providers:  SeriousBroker.it  This test is no t yet approved or cleared by the [place] FDA and  has been authorized for detection and/or diagnosis of SARS-CoV-2 by FDA under an Emergency Use Authorization (EUA). This EUA will remain  in effect (meaning this test can be used) for the duration of the COVID-19 declaration under Section 564(b)(1) of the Act, 21 U.S.C.section 360bbb-3(b)(1), unless the authorization is terminated  or revoked sooner.       Influenza A by PCR NEGATIVE NEGATIVE Final   Influenza B by PCR NEGATIVE NEGATIVE Final    Comment: (NOTE) The Xpert Xpress SARS-CoV-2/FLU/RSV plus assay is intended as an aid in the diagnosis of influenza from Nasopharyngeal swab specimens and should not be used as a sole basis for treatment. Nasal washings and aspirates are unacceptable for Xpert Xpress SARS-CoV-2/FLU/RSV testing.  Fact Sheet for Patients: BloggerCourse.com  Fact Sheet for Healthcare Providers: SeriousBroker.it  This test is not yet approved or cleared by the [place] FDA and has been authorized for detection and/or diagnosis of SARS-CoV-2 by FDA under an Emergency Use Authorization (EUA). This EUA will remain in effect (meaning this test can  be used) for the duration of the COVID-19 declaration under Section 564(b)(1) of the Act, 21 U.S.C. section 360bbb-3(b)(1), unless the authorization is terminated or revoked.  Performed at [facility], [address], [place], [place] [address]      Discharge Instructions:   Discharge Instructions  Ambulatory referral to Neurology   Complete by: As directed    An appointment is requested in approximately: 4 weeks for TIA followup   Ambulatory referral to Vascular Surgery   Complete by: As directed    Followup of carotid stenosis   Diet - low sodium heart healthy   Complete by: As directed    Discharge instructions   Complete by: As directed    Please follow-up with your primary care physician in 1 to 2 weeks.  Outpatient referral to vascular surgery and the [facility] neurology has been made.  Please discuss with your primary care physician regarding Brilinta.  If you can continue Brilinta beyond 30 days please continue to do , if not resume plavix starting 4/12. (do not take plavix until 4/12) Continue aspirin.  Seek medical attention for worsening symptoms.   Increase activity slowly   Complete by: As directed      Allergies as of 11/09/2020      Reactions   Penicillins Anaphylaxis   Statins    Kidney Injury      Medication List    TAKE these medications   aspirin EC 81 MG tablet Take 81 mg by mouth at bedtime. Swallow whole.   clonazePAM 1 MG tablet Commonly known as: KLONOPIN Take 0.5-1 mg by mouth 2 (two) times daily as needed for anxiety.   clopidogrel 75 MG tablet Commonly known as: Plavix Take 1 tablet (75 mg total) by mouth daily. After completion of brillinta on 4/12 Start taking on: December 10, 2020 What changed:   additional instructions  These instructions start on December 10, 2020. If you are unsure what to do until then, ask your doctor or other care provider.   diltiazem 180 MG 24 hr capsule Commonly known as: CARDIZEM CD Take 1 capsule (180  mg total) by mouth daily.   escitalopram 20 MG tablet Commonly known as: LEXAPRO Take 20 mg by mouth at bedtime.   Evolocumab 140 MG/ML Soaj Inject 1 mL into the skin every 14 (fourteen) days.   nitroGLYCERIN 0.4 MG SL tablet Commonly known as: NITROSTAT Place 1 tablet (0.4 mg total) under the tongue every 5 (five) minutes as needed for chest pain.   pantoprazole 40 MG tablet Commonly known as: Protonix Take 1 tablet (40 mg total) by mouth daily.   ticagrelor 90 MG Tabs tablet Commonly known as: BRILINTA Take 1 tablet (90 mg total) by mouth 2 (two) times daily. Start taking on: November 10, 2020   traZODone 50 MG tablet Commonly known as: DESYREL Take 50 mg by mouth at bedtime.       Follow-up Information    Lonie Peak, PA-C. Schedule an appointment as soon as possible for a visit.   Specialty: Physician Assistant Why: regular followup Contact information: [address] [place] [address] [phone]        Baldo Daub, MD .   Specialties: Cardiology, Radiology Contact information: [address] [place] [address] [phone]        Nada Libman, MD. Schedule an appointment as soon as possible for a visit in 4 week(s).   Specialties: Vascular Surgery, Cardiology Why: for carotid artery narrowing followup Contact information: [address] [place] [address] [phone]                Time coordinating discharge: 39 minutes  Signed:  Kendyl Bissonnette  [facility] 11/09/2020, 1:56 PM

## 2020-11-09 NOTE — Evaluation (Signed)
Occupational Therapy Evaluation Patient Details Name: Alyssa Ritter MRN: [id number] DOB: [birth year] Today's Date: 11/09/2020    History of Present Illness Alyssa Ritter isa  60 y/o female who reported to Ed following R leg weakness and numbness leading to a fall. Pt was admitted on 11/08/20 and diagnosed with TIA. CT and MRI negative for acute findings. PMH includes CAD, anxiety and depression, tremors, PVD, and HTN.   Clinical Impression   Pt PTA: Pt living family and reports independence. Pt currently, with R foot swelling, bruising after fall, but able to maintain WBAT on it without trouble. Pt ambulating in room, hallway and/up down stairs today with no physical assist. Pt reaching feet for LB dressing and standing at sink for ADL. No focal deficits found. Pt back to her baseline function for ADL and mobility. No further OT skilled services required. OT signing off.  BP taken by NT 185/78     Follow Up Recommendations  No OT follow up    Equipment Recommendations  None recommended by OT    Recommendations for Other Services       Precautions / Restrictions Precautions Precautions: Fall Precaution Comments: low fall Restrictions Weight Bearing Restrictions: No      Mobility Bed Mobility Overal bed mobility: Independent                  Transfers Overall transfer level: Independent                    Balance Overall balance assessment: Modified Independent                                         ADL either performed or assessed with clinical judgement   ADL Overall ADL's : Modified independent                                       General ADL Comments: Pt with R foot swelling, bruising after fall, but able to maintain WBAT on it without trouble. Pt ambulating in room, hallway and/up down stairs today with no physical assist.     Vision Baseline Vision/History: No visual deficits Patient Visual Report: No change  from baseline Vision Assessment?: No apparent visual deficits     Perception     Praxis      Pertinent Vitals/Pain Pain Assessment: Faces Pain Score: 2  Faces Pain Scale: Hurts a little bit Pain Location: R foot from fall Pain Descriptors / Indicators: Sore;Discomfort Pain Intervention(s): Monitored during session     Hand Dominance Right   Extremity/Trunk Assessment Upper Extremity Assessment Upper Extremity Assessment: Overall WFL for tasks assessed;RUE deficits/detail;LUE deficits/detail RUE Deficits / Details: 5/5 MM grade RUE Sensation: WNL RUE Coordination: WNL LUE Deficits / Details: 5/5 MM grade LUE Sensation: WNL LUE Coordination: WNL   Lower Extremity Assessment Lower Extremity Assessment: Overall WFL for tasks assessed   Cervical / Trunk Assessment Cervical / Trunk Assessment: Normal   Communication Communication Communication: No difficulties   Cognition Arousal/Alertness: Awake/alert Behavior During Therapy: WFL for tasks assessed/performed Overall Cognitive Status: Within Functional Limits for tasks assessed                                 General Comments: A/O x4  General Comments  VSS on RA    Exercises     Shoulder Instructions      Home Living Family/patient expects to be discharged to:: Private residence Living Arrangements: Spouse/significant other;Other (Comment) Available Help at Discharge: Family;Available 24 hours/day Type of Home: House Home Access: Stairs to enter [employer] of Steps: 5 Entrance Stairs-Rails: Right Home Layout: Multi-level;Bed/bath upstairs     Bathroom Shower/Tub: Tub/shower unit;Walk-in shower   Bathroom Toilet: Standard     Home Equipment: None          Prior Functioning/Environment Level of Independence: Independent                 OT Problem List: Decreased activity tolerance      OT Treatment/Interventions:      OT Goals(Current goals can be found in the  care plan section) Acute Rehab OT Goals Patient Stated Goal: to go home OT Goal Formulation: All assessment and education complete, DC therapy Potential to Achieve Goals: Good  OT Frequency:     Barriers to D/C:            Co-evaluation              AM-PAC OT "6 Clicks" Daily Activity     Outcome Measure Help from another person eating meals?: None Help from another person taking care of personal grooming?: None Help from another person toileting, which includes using toliet, bedpan, or urinal?: None Help from another person bathing (including washing, rinsing, drying)?: A Little Help from another person to put on and taking off regular upper body clothing?: None Help from another person to put on and taking off regular lower body clothing?: None 6 Click Score: 23   End of Session Nurse Communication: Mobility status  Activity Tolerance: Patient tolerated treatment well Patient left: in bed;Other (comment) (Pt going off the floor with transport upon OTR leaving room)  OT Visit Diagnosis: Unsteadiness on feet (R26.81);Pain                Time: 2703-5009 OT Time Calculation (min): 24 min Charges:  OT General Charges $OT Visit: 1 Visit OT Evaluation $OT Eval Moderate Complexity: 1 Mod OT Treatments $Self Care/Home Management : 8-22 mins  Flora Lipps, OTR/L Acute Rehabilitation Services Pager: [phone] Office: [phone]   Khloe Hunkele C 11/09/2020, 1:33 PM

## 2020-11-09 NOTE — Care Management (Signed)
Provided patient with both the 30 day and the co pay reduction cards for Brilinta.

## 2020-11-09 NOTE — ED Notes (Signed)
Pt passes the swallow screen

## 2020-11-09 NOTE — Evaluation (Signed)
Physical Therapy Evaluation Patient Details Name: Alyssa Ritter MRN: [id number] DOB: 05-11-1961 Today's Date: 11/09/2020   History of Present Illness  Avia isa  59 y/o female who reported to Ed following R leg weakness and numbness leading to a fall. Pt was admitted on 11/08/20 and diagnosed with TIA. CT and MRI negative for acute findings. PMH includes CAD, anxiety and depression, tremors, PVD, and HTN.    Clinical Impression  Pt received in bed, cooperative and pleasant. Reports feeling back to normal. Independent with all mobility and pt at baseline level. No PT follow up needed at this time. Pt left in chair with all needs met, call bell within reach, and NT aware of status. PT signing off. Thank you for the opportunity to participate in this patient's care.     Follow Up Recommendations No PT follow up    Equipment Recommendations  None recommended by PT    Recommendations for Other Services       Precautions / Restrictions Precautions Precautions: Fall Precaution Comments: low fall Restrictions Weight Bearing Restrictions: No      Mobility  Bed Mobility Overal bed mobility: Independent                  Transfers Overall transfer level: Independent                  Ambulation/Gait Ambulation/Gait assistance: Independent Gait Distance (Feet): 250 Feet Assistive device: None Gait Pattern/deviations: Step-to pattern;Decreased stance time - right     General Gait Details: Some soreness on R foot but WFL  Stairs Stairs: Yes Stairs assistance: Independent Stair Management: Two rails;Alternating pattern;Forwards Number of Stairs: 3    Wheelchair Mobility    Modified Rankin (Stroke Patients Only)       Balance Overall balance assessment: Modified Independent                                           Pertinent Vitals/Pain Pain Assessment: Faces Pain Score: 2  Faces Pain Scale: Hurts a little bit Pain Location: R  foot from fall Pain Descriptors / Indicators: Sore;Discomfort Pain Intervention(s): Monitored during session;Repositioned    Home Living Family/patient expects to be discharged to:: Private residence Living Arrangements: Spouse/significant other;Other (Comment) (grandchildren) Available Help at Discharge: Family;Available 24 hours/day Type of Home: House Home Access: Stairs to enter Entrance Stairs-Rails: Right Entrance Stairs-Number of Steps: 5 Home Layout: Multi-level;Bed/bath upstairs Home Equipment: None      Prior Function Level of Independence: Independent               Hand Dominance   Dominant Hand: Right    Extremity/Trunk Assessment   Upper Extremity Assessment Upper Extremity Assessment: Defer to OT evaluation    Lower Extremity Assessment Lower Extremity Assessment: Overall WFL for tasks assessed    Cervical / Trunk Assessment Cervical / Trunk Assessment: Normal  Communication   Communication: No difficulties  Cognition Arousal/Alertness: Awake/alert Behavior During Therapy: WFL for tasks assessed/performed Overall Cognitive Status: Within Functional Limits for tasks assessed                                 General Comments: Very pleasant and cooperative      General Comments General comments (skin integrity, edema, etc.): VSS on RA    Exercises  Assessment/Plan    PT Assessment Patent does not need any further PT services  PT Problem List         PT Treatment Interventions      PT Goals (Current goals can be found in the Care Plan section)       Frequency     Barriers to discharge        Co-evaluation               AM-PAC PT "6 Clicks" Mobility  Outcome Measure Help needed turning from your back to your side while in a flat bed without using bedrails?: None Help needed moving from lying on your back to sitting on the side of a flat bed without using bedrails?: None Help needed moving to and from a bed  to a chair (including a wheelchair)?: None Help needed standing up from a chair using your arms (e.g., wheelchair or bedside chair)?: None Help needed to walk in hospital room?: None Help needed climbing 3-5 steps with a railing? : None 6 Click Score: 24    End of Session Equipment Utilized During Treatment: Gait belt Activity Tolerance: Patient tolerated treatment well Patient left: in chair;with call bell/phone within reach Nurse Communication: Mobility status      Time:  -      Charges:         Rosita Kea, SPT

## 2020-11-09 NOTE — ED Notes (Addendum)
Neurologist at bedside. 

## 2020-11-09 NOTE — ED Notes (Signed)
pts daughter phone number  [facility] [phone]

## 2020-11-09 NOTE — ED Notes (Signed)
Pt returned from mri alert and oriented x 4  She still has a sl headache  From this past Tuesday that has gotten a little better

## 2020-11-27 DIAGNOSIS — I2510 Atherosclerotic heart disease of native coronary artery without angina pectoris: Secondary | ICD-10-CM | POA: Diagnosis not present

## 2020-11-27 DIAGNOSIS — I739 Peripheral vascular disease, unspecified: Secondary | ICD-10-CM | POA: Diagnosis not present

## 2020-11-27 DIAGNOSIS — I10 Essential (primary) hypertension: Secondary | ICD-10-CM | POA: Diagnosis not present

## 2020-11-27 DIAGNOSIS — Z8673 Personal history of transient ischemic attack (TIA), and cerebral infarction without residual deficits: Secondary | ICD-10-CM | POA: Diagnosis not present

## 2020-12-05 DIAGNOSIS — E875 Hyperkalemia: Secondary | ICD-10-CM | POA: Diagnosis not present

## 2020-12-11 ENCOUNTER — Ambulatory Visit (INDEPENDENT_AMBULATORY_CARE_PROVIDER_SITE_OTHER): Payer: BC Managed Care – PPO | Admitting: Adult Health

## 2020-12-11 ENCOUNTER — Encounter: Payer: Self-pay | Admitting: Adult Health

## 2020-12-11 VITALS — BP 122/69 | HR 59 | Ht 63.0 in | Wt 141.0 lb

## 2020-12-11 DIAGNOSIS — G459 Transient cerebral ischemic attack, unspecified: Secondary | ICD-10-CM

## 2020-12-11 NOTE — Patient Instructions (Signed)
Continue aspirin 81 mg daily and Brilinta (ticagrelor) 90 mg bid  and Repatha for secondary stroke prevention  From a stroke standpoint, ongoing use of both aspirin and Brilinta is not indicated and would recommend transitioning to Plavix - please further discuss with Dr. Myra Gianotti vascular surgeon at scheduled follow-up visit prior to making any changes  Continue to follow up with PCP regarding cholesterol management  Maintain strict control of cholesterol with LDL cholesterol (bad cholesterol) goal below 70 mg/dL.       Followup in the future with me in 6 months or call earlier if needed       Thank you for coming to see [place] at [facility]. I hope we have been able to provide you high quality care today.  You may receive a patient satisfaction survey over the next few weeks. We would appreciate your feedback and comments so that we may continue to improve ourselves and the health of our patients.

## 2020-12-11 NOTE — Progress Notes (Signed)
[facility] [address] [place] [address] [phone]       HOSPITAL FOLLOW UP NOTE  [name] Date of Birth:  01-09-1961 Medical Record Number:  [id number]   Reason for Referral:  hospital stroke follow up    SUBJECTIVE:   CHIEF COMPLAINT:  Chief Complaint  Patient presents with  . Follow-up    TR  alone Pt is well, no symptoms, no complaints     HPI:   [name] is a 60 y.o. 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  Vascular surgery consulted and recommended outpatient follow-up.  MRI negative for acute stroke.  Recommended aspirin and Brilinta for 30 days then single agent alone. LDL 71 on Repatha with history of statin intolerance.  Other stroke risk factors include family history of stroke, CAD and migraines.  Evaluated by therapies and discharged home in stable condition without therapy needs.   Stroke like episode which is consistent with possible limb shaking TIA in the setting of l moderate eft calcific carotid stenosis on ultrasound, however CTA neck has discordant findings showing no significant stenosis of the left ICA   code Stroke HCT was without acute findings   CTA head & neck is mild atherosclerosis of head and neck without large vessel occlusion or proximal stenosis.    MRI without acute findings  Carotid Doppler  Left carotid stenosis 60-79%. Vascular surgeon, Alyssa Ritter, consulted.   2D Echo left ventricular ejection fraction of 60 to 65%.  ASA  LDL  71  HgbA1c 5.4  VTE prophylaxis Lovenox 40mg    On ASA 81mg  and Plavix PTA, Currently on ASA 81 mg daily and recommended 30-day course of Brilinta in addition to aspirin and then Plavix alone  Therapy recommendations:  Cleared for home   Disposition:  Home   Today, 12/11/2020, [name] is being seen for hospital follow-up unaccompanied She has been doing well since discharge without new or recurring stroke/TIA symptoms She has returned back to all prior activities without difficulty  Has remained on aspirin and Brilinta with mild bruising but no other associated side effects Continues on Repatha without associated signs Blood pressure today 122/69 -monitors at home and has been stable  Scheduled follow-up visit with Dr. 12/13/2020 on 4/25  No concerns at this time    ROS:   14 system review of systems performed and negative with exception of no complaints  PMH:  Past Medical History:  Diagnosis Date  . Anxiety disorder   . Benign essential tremor   . CAD (coronary artery disease)   . Constipation   . Depression with anxiety   . Hematuria   . Hypercholesteremia   . Peripheral vascular disease of extremity (HCC)   . Weight loss, abnormal     PSH:  Past Surgical History:  Procedure Laterality Date  . CESAREAN SECTION    . CORONARY STENT INTERVENTION N/A 09/06/2020   Procedure: CORONARY STENT INTERVENTION;  Surgeon: 5/25, MD;  Location[facility] INVASIVE CV LAB;  Service: Cardiovascular;  Laterality: N/A;  . LEFT  HEART CATH AND CORONARY ANGIOGRAPHY N/A 09/06/2020   Procedure: LEFT HEART CATH AND CORONARY ANGIOGRAPHY;  Surgeon: Alyssa Lex, MD;  Location: [facility] INVASIVE CV LAB;  Service: Cardiovascular;  Laterality: N/A;  . OOPHORECTOMY Bilateral    Right cyst  . ROTATOR CUFF REPAIR Left 2006    Social History:  Social History   Socioeconomic History  . Marital status: Married    Spouse name: Not on file  . Number of children: Not on file  . Years of  education: Not on file  . Highest education level: Not on file  Occupational History  . Not on file  Tobacco Use  . Smoking status: Former Smoker    Types: Cigarettes    Quit date: 01/2018    Years since quitting: 2.8  . Smokeless tobacco: Current User  Vaping Use  . Vaping Use: Former  . Quit date: 01/29/2018  Substance and Sexual Activity  . Alcohol use: Yes    Comment: occasionally  . Drug use: Never  . Sexual activity: Not on file  Other Topics Concern  . Not on file  Social History Narrative  . Not on file   Social Determinants of Health   Financial Resource Strain: Not on file  Food Insecurity: Not on file  Transportation Needs: Not on file  Physical Activity: Not on file  Stress: Not on file  Social Connections: Not on file  Intimate Partner Violence: Not on file    Family History:  Family History  Problem Relation Age of Onset  . Colon polyps Mother   . Hypertension Mother   . Hypercholesterolemia Mother   . Arthritis Mother   . Seizures Mother   . Hypertension Father   . CAD Father        died of complications of AML  . Hypercholesterolemia Father   . Thyroid disease Sister   . Arthritis Sister   . Hypertension Brother   . Stroke Brother   . Colon cancer Neg Hx   . Breast cancer Neg Hx     Medications:   Current Outpatient Medications on File Prior to Visit  Medication Sig Dispense Refill  . aspirin EC 81 MG tablet Take 81 mg by mouth at bedtime. Swallow whole.    . clonazePAM (KLONOPIN) 1 MG tablet Take 0.5-1 mg by mouth 2 (two) times daily as needed for anxiety.    Marland Kitchen diltiazem (CARDIZEM CD) 180 MG 24 hr capsule Take 1 capsule (180 mg total) by mouth daily. 90 capsule 3  . escitalopram (LEXAPRO) 20 MG tablet Take 20 mg by mouth at bedtime.    . Evolocumab 140 MG/ML SOAJ Inject 1 mL into the skin every 14 (fourteen) days. 6 mL 3  . nitroGLYCERIN (NITROSTAT) 0.4 MG SL tablet Place 1 tablet (0.4 mg total) under the tongue every 5 (five) minutes as  needed for chest pain. 30 tablet 0  . ticagrelor (BRILINTA) 90 MG TABS tablet Take 1 tablet (90 mg total) by mouth 2 (two) times daily. 60 tablet 0  . traZODone (DESYREL) 50 MG tablet Take 50 mg by mouth at bedtime.    . [DISCONTINUED] carvedilol (COREG) 3.125 MG tablet Take 1 tablet (3.125 mg total) by mouth 2 (two) times daily with a meal. 60 tablet 1   No current facility-administered medications on file prior to visit.    Allergies:   Allergies  Allergen Reactions  . Penicillins Anaphylaxis  . Statins     Kidney Injury  OBJECTIVE:  Physical Exam  Vitals:   12/11/20 1304  BP: 122/69  Pulse: (!) 59  Weight: 141 lb (64 kg)  Height: 5\' 3"  (1.6 m)   Body mass index is 24.98 kg/m. No exam data present  General: well developed, well nourished,  very pleasant middle-aged Caucasian female, seated, in no evident distress Head: head normocephalic and atraumatic.   Neck: supple with no carotid or supraclavicular bruits Cardiovascular: regular rate and rhythm, no murmurs Musculoskeletal: no deformity Skin:  no rash/petichiae Vascular:  Normal pulses all extremities   Neurologic Exam Mental Status: Awake and fully alert.   Fluent speech and language.  Oriented to place and time. Recent and remote memory intact. Attention span, concentration and fund of knowledge appropriate. Mood and affect appropriate.  Cranial Nerves: Fundoscopic exam reveals sharp disc margins. Pupils equal, briskly reactive to light. Extraocular movements full without nystagmus. Visual fields full to confrontation. Hearing intact. Facial sensation intact. Face, tongue, palate moves normally and symmetrically.  Motor: Normal bulk and tone. Normal strength in all tested extremity muscles Sensory.: intact to touch , pinprick , position and vibratory sensation.  Coordination: Rapid alternating movements normal in all extremities. Finger-to-nose and heel-to-shin performed accurately bilaterally. Gait and  Station: Arises from chair without difficulty. Stance is normal. Gait demonstrates normal stride length and balance without assistive device. Tandem walk and heel toe without difficulty.  Reflexes: 1+ and symmetric. Toes downgoing.     NIHSS  0 Modified Rankin  0      ASSESSMENT: Alyssa Ritter is a 60 y.o. year old female presented with sudden onset of transient right leg weakness and numbness on 11/08/2020 possibly in setting of TIA with carotid duplex showing left ICA 60 to 70% stenosis however CTA neck did not confirm this.  Vascular risk factors include HLD, CAD, angina r/t vasospasm s/p PCI and stenting proximal RCA on DAPT 08/2020 and right iliac artery stenosis s/p stenting 2003.      PLAN:  1. TIA :  a. Continue aspirin 81 mg daily and Brilinta (ticagrelor) 90 mg bid as per patient, she was told by [facility] to continue Brilinta for as long as insurance would cover.  Advised her to discuss this further with them as scheduled visit on 4/25 -long-term use of Brilinta not indicated from stroke standpoint and would recommend transition to Plavix alone but will discuss with [facility] prior to making any changes.  Continue Repatha for secondary stroke prevention.   b. Discussed secondary stroke prevention measures and importance of close PCP follow up for aggressive stroke risk factor management  2. HTN: BP goal <130/90.  Stable on current regimen per PCP/cardiology 3. HLD: LDL goal <70. Recent LDL 71 on Repatha per cardiology.  4. Carotid stenosis: f/u with [facility] Dr. 5/25 4/25 to further discuss    Follow up in 6 months or call earlier if needed   CC:  [facility] provider: Dr. 5/25 PCP: Pearlean Brownie, PA-C    I spent 45 minutes of face-to-face and non-face-to-face time with patient.  This included previsit chart review including recent hospitalization pertinent progress notes, lab work and imaging, lab review, study review, order entry, electronic health record documentation, patient  education regarding recent TIA, carotid stenosis, importance of managing stroke risk factors and answered all other questions to patient satisfaction   Lonie Peak, AGNP-BC  [facility] [address] [place], [place]  Phone [phone] Fax [phone] Note: This document was prepared with digital dictation and possible smart  Company secretary. Any transcriptional errors that result from this process are unintentional.

## 2020-12-16 ENCOUNTER — Encounter: Payer: BC Managed Care – PPO | Admitting: Surgery

## 2020-12-23 ENCOUNTER — Encounter: Payer: Self-pay | Admitting: Surgery

## 2020-12-23 ENCOUNTER — Ambulatory Visit (INDEPENDENT_AMBULATORY_CARE_PROVIDER_SITE_OTHER): Payer: BC Managed Care – PPO | Admitting: Surgery

## 2020-12-23 ENCOUNTER — Other Ambulatory Visit: Payer: Self-pay

## 2020-12-23 VITALS — BP 146/75 | HR 58 | Temp 98.0°F | Resp 20 | Ht 63.0 in | Wt 146.0 lb

## 2020-12-23 DIAGNOSIS — I6522 Occlusion and stenosis of left carotid artery: Secondary | ICD-10-CM

## 2020-12-23 NOTE — Progress Notes (Signed)
I agree with the above plan 

## 2020-12-23 NOTE — Progress Notes (Signed)
[facility]  Patient name: Alyssa Ritter MRN: [id number] DOB: 1960/09/28 Sex: female   REQUESTING PROVIDER:    Ihor Austin   REASON FOR CONSULT:    Carotid stenosis  HISTORY OF PRESENT ILLNESS:   Alyssa Ritter is a 60 y.o. female, who presented to the hospital on 11/08/2020 with right leg numbness and weakness.  Her work-up included a MRI which was negative for stroke.  A carotid duplex showed a 60-79% left-sided carotid stenosis.  This was followed up with a CT angiogram of the neck that showed minimal left-sided stenosis.  After reviewing the images I felt that the stenosis was less than 50% and so no intervention was recommended.  The stroke service changed her from Plavix to Brilinta.  She is here today for follow-up.  She has not had any other episodes.  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  She has a nonobstructive arteriogram from 2005.  She has coronary CT scan in January 2022 that showed progressive coronary artery disease.  Angiography showed severe single-vessel disease in the right coronary.  She had PCI performed.  She reports a right iliac stent in 2004.  She denies claudication symptoms.  She has a statin allergy and is now taking Repatha.  She is no longer smoking.  PAST MEDICAL HISTORY    Past Medical History:  Diagnosis Date  . Anxiety disorder   . Benign essential tremor   . CAD (coronary artery disease)   . Carotid artery occlusion   . Constipation   . Depression with anxiety   . Hematuria   . Hypercholesteremia   . Peripheral vascular disease of extremity (HCC)   . Weight loss, abnormal      FAMILY HISTORY   Family History  Problem Relation Age of Onset  . Colon polyps Mother   . Hypertension Mother   . Hypercholesterolemia Mother   . Arthritis Mother   . Seizures Mother   . Hypertension Father   . CAD Father         died of complications of AML  . Hypercholesterolemia Father   . Thyroid disease Sister   . Arthritis Sister   . Hypertension Brother   . Stroke Brother   . Colon cancer Neg Hx   . Breast cancer Neg Hx     SOCIAL HISTORY:   Social History   Socioeconomic History  . Marital status: Married    Spouse name: Not on file  . Number of children: Not on file  . Years of education: Not on file  . Highest education level: Not on file  Occupational History  . Not on file  Tobacco Use  . Smoking status: Former Smoker    Types: Cigarettes    Quit date: 01/2018    Years since quitting: 2.9  . Smokeless tobacco: Current User  Vaping Use  . Vaping Use: Former  . Quit date: 01/29/2018  Substance and Sexual Activity  . Alcohol use: Yes    Comment: occasionally  . Drug use: Never  . Sexual activity: Not on file  Other Topics Concern  . Not on file  Social History Narrative  . Not on file   Social Determinants of Health   Financial Resource Strain: Not on file  Food Insecurity: Not on file  Transportation Needs: Not on file  Physical Activity: Not on file  Stress: Not on file  Social Connections: Not on file  Intimate Partner Violence: Not on file    ALLERGIES:    Allergies  Allergen Reactions  . Penicillins Anaphylaxis  . Statins     Kidney Injury    CURRENT MEDICATIONS:    Current Outpatient Medications  Medication Sig Dispense Refill  . aspirin EC 81 MG tablet Take 81 mg by mouth at bedtime. Swallow whole.    . clonazePAM (KLONOPIN) 1 MG tablet Take 0.5-1 mg by mouth 2 (two) times daily as needed for anxiety.    Marland Kitchen diltiazem (CARDIZEM CD) 180 MG 24 hr capsule Take 1 capsule (180 mg total) by mouth daily. 90 capsule 3  . escitalopram (LEXAPRO) 20 MG tablet Take 20 mg by mouth at bedtime.    . Evolocumab 140 MG/ML SOAJ Inject 1 mL into the skin every 14 (fourteen) days. 6 mL 3  . nitroGLYCERIN (NITROSTAT) 0.4 MG SL tablet Place 1 tablet (0.4 mg total) under the tongue  every 5 (five) minutes as needed for chest pain. 30 tablet 0  . ticagrelor (BRILINTA) 90 MG TABS tablet Take 1 tablet (90 mg total) by mouth 2 (two) times daily. 60 tablet 0  . traZODone (DESYREL) 50 MG tablet Take 50 mg by mouth at bedtime.     No current facility-administered medications for this visit.    REVIEW OF SYSTEMS:   [X]  denotes positive finding, [ ]  denotes negative finding Cardiac  Comments:  Chest pain or chest pressure:    Shortness of breath upon exertion:    Short of breath when lying flat:    Irregular heart rhythm:        Vascular    Pain in calf, thigh, or hip brought on by ambulation:    Pain in feet at night that wakes you up from your sleep:     Blood clot in your veins:    Leg swelling:         Pulmonary    Oxygen at home:    Productive cough:     Wheezing:         Neurologic    Sudden weakness in arms or legs:  x   Sudden numbness in arms or legs:  x   Sudden onset of difficulty speaking or slurred speech:    Temporary loss of vision in one eye:     Problems with dizziness:         Gastrointestinal    Blood in stool:      Vomited blood:         Genitourinary    Burning when urinating:     Blood in urine:        Psychiatric    Major depression:         Hematologic    Bleeding problems:    Problems with blood clotting too easily:        Skin    Rashes or ulcers:        Constitutional    Fever or chills:     PHYSICAL EXAM:   Vitals:   12/23/20 0902 12/23/20 0903  BP: 130/72 (!) 146/75  Pulse: (!) 58   Resp: 20   Temp: 98 F (36.7 C)   SpO2: 98%   Weight: 146 lb (66.2 kg)   Height: 5\' 3"  (1.6 m)     GENERAL: The patient is a well-nourished female, in no acute distress. The vital signs are documented above. CARDIAC: There is a regular rate and rhythm.  VASCULAR: Palpable right dorsalis pedis pulse, nonpalpable  left. PULMONARY: Nonlabored respirations ABDOMEN: Soft and non-tende.  No pulsatile mass MUSCULOSKELETAL: There  are no major deformities or cyanosis. NEUROLOGIC: No focal weakness or paresthesias are detected. SKIN: There are no ulcers or rashes noted. PSYCHIATRIC: The patient has a normal affect.  STUDIES:   I have reviewed the following: Right Carotid: Velocities in the right ICA are consistent with a 1-39%  stenosis.   Left Carotid: Velocities in the left ICA are consistent with a 60-79%  stenosis.   Vertebrals: Bilateral vertebral arteries demonstrate antegrade flow.  Subclavians: Normal flow hemodynamics were seen in bilateral subclavian        arteries.   CT angiogram: Aortic arch: Normal variant aortic arch branching pattern with common origin of the brachiocephalic and left common carotid arteries. Mild atherosclerosis without significant arch vessel origin stenosis.  Right carotid system: Patent with mild calcified plaque at the carotid bifurcation. No evidence of significant stenosis or dissection.  Left carotid system: Patent with calcified plaque at the carotid bifurcation resulting in less than 50% stenosis of the distal common carotid artery. No ICA stenosis or evidence of dissection. ASSESSMENT and PLAN   Left carotid stenosis: The etiology of her neurologic symptoms of right leg numbness and weakness is unclear.  Ultrasound overestimated the degree of stenosis on the left.  She does have some posterior plaque but this is not hemodynamically significant, as confirmed by CT scan.  I have her scheduled for follow-up ultrasound in 6 months.  Prior to any intervention, I would want to confirm ultrasound findings with CT scan, since she has a history of a discrepancy between the 2, with ultrasound overestimating her stenosis.  The patient is taking Brilinta.  I have told her to continue this and to speak with Dr. Dulce Sellar, how long she will need dual antiplatelet therapy given her recent PCI.  She will continue her Repatha for her cholesterol issues.   Charlena Cross,  MD, FACS [facility] [phone] Pager [phone]

## 2021-01-01 ENCOUNTER — Other Ambulatory Visit: Payer: Self-pay

## 2021-01-01 DIAGNOSIS — I6522 Occlusion and stenosis of left carotid artery: Secondary | ICD-10-CM

## 2021-03-19 DIAGNOSIS — I6529 Occlusion and stenosis of unspecified carotid artery: Secondary | ICD-10-CM | POA: Insufficient documentation

## 2021-04-01 NOTE — Progress Notes (Signed)
Cardiology Office Note:    Date:  04/02/2021   ID:  Alyssa Ritter, Alyssa Ritter July 09, 1961, MRN [id number]  PCP:  Lonie Peak, PA-C  Cardiologist:  Norman Herrlich, MD    Referring MD: Lonie Peak, PA-C    ASSESSMENT:    1. Coronary artery disease involving native heart, unspecified vessel or lesion type, unspecified whether angina present   2. Hypercholesteremia   3. Elevated lipoprotein(a)    PLAN:    In order of problems listed above:  She continues to do well with her CAD having no angina we will transition Brilinta back to clopidogrel and stay on long-term dual antiplatelet history of stroke rate limiting calcium channel blocker trend her blood pressure at home its not been elevated and PCSK9 therapy check labs today including LP(a) level   Next appointment: 6 months   Medication Adjustments/Labs and Tests Ordered: Current medicines are reviewed at length with the patient today.  Concerns regarding medicines are outlined above.  No orders of the defined types were placed in this encounter.  No orders of the defined types were placed in this encounter.   Chief complaint follow-up for CAD and having trouble tolerating Brilinta.   History of Present Illness:    Alyssa Ritter is a 60 y.o. female with a hx of severe familial check labs today including LP(a) level level statin intolerance angina felt to be due to coronary vasospasm with mild nonobstructive right coronary artery stenosis in 2005, PAD with previous right CIA stent last seen 10/03/2020.  She had cardiac CTA in January 2022 showing progressive CAD with flow-limiting stenosis of the right coronary artery and 25% to 9% left main coronary artery stenosis underwent coronary angiography which showed severe single-vessel disease involving the ostial and proximal right coronary artery and had PCI and 2 overlapping drug-eluting stents with a good technical result and moderate 40% proximal left main stenosis with  normal left ventricular ejection fraction.  Compliance with diet, lifestyle and medications: Yes  She request to come off Brilinta she is still short of breath with it but she also has bruising excessively more than 6 months out from PCI and she will transition back to clopidogrel She tolerates her lipid-lowering no muscle pain or weakness Has had no angina exertional shortness of breath palpitation or syncope and is pleased with the quality of her life Past Medical History:  Diagnosis Date   Anxiety disorder    Benign essential tremor    CAD (coronary artery disease)    Carotid artery occlusion    Constipation    Depression with anxiety    Hematuria    Hypercholesteremia    Peripheral vascular disease of extremity (HCC)    Weight loss, abnormal     Past Surgical History:  Procedure Laterality Date   CESAREAN SECTION     CORONARY STENT INTERVENTION N/A 09/06/2020   Procedure: CORONARY STENT INTERVENTION;  Surgeon: Marykay Lex, MD;  Location: [facility] INVASIVE CV LAB;  Service: Cardiovascular;  Laterality: N/A;   LEFT HEART CATH AND CORONARY ANGIOGRAPHY N/A 09/06/2020   Procedure: LEFT HEART CATH AND CORONARY ANGIOGRAPHY;  Surgeon: Marykay Lex, MD;  Location: [facility] INVASIVE CV LAB;  Service: Cardiovascular;  Laterality: N/A;   OOPHORECTOMY Bilateral    Right cyst   ROTATOR CUFF REPAIR Left 2006    Current Medications: Current Meds  Medication Sig   aspirin EC 81 MG tablet Take 81 mg by mouth at bedtime. Swallow whole.   clonazePAM (KLONOPIN) 1 MG tablet  Take 0.5-1 mg by mouth 2 (two) times daily as needed for anxiety.   escitalopram (LEXAPRO) 20 MG tablet Take 20 mg by mouth at bedtime.   nitroGLYCERIN (NITROSTAT) 0.4 MG SL tablet Place 1 tablet (0.4 mg total) under the tongue every 5 (five) minutes as needed for chest pain.   ticagrelor (BRILINTA) 90 MG TABS tablet Take 1 tablet (90 mg total) by mouth 2 (two) times daily.   traZODone (DESYREL) 50 MG tablet Take 50 mg by mouth at  bedtime.     Allergies:   Penicillins and Statins   Social History   Socioeconomic History   Marital status: Married    Spouse name: Not on file   Number of children: Not on file   Years of education: Not on file   Highest education level: Not on file  Occupational History   Not on file  Tobacco Use   Smoking status: Former    Types: Cigarettes    Quit date: 01/2018    Years since quitting: 3.1   Smokeless tobacco: Current  Vaping Use   Vaping Use: Former   Quit date: 01/29/2018  Substance and Sexual Activity   Alcohol use: Yes    Comment: occasionally   Drug use: Never   Sexual activity: Not on file  Other Topics Concern   Not on file  Social History Narrative   Not on file   Social Determinants of Health   Financial Resource Strain: Not on file  Food Insecurity: Not on file  Transportation Needs: Not on file  Physical Activity: Not on file  Stress: Not on file  Social Connections: Not on file     Family History: The patient's family history includes Arthritis in her mother and sister; CAD in her father; Colon polyps in her mother; Hypercholesterolemia in her father and mother; Hypertension in her brother, father, and mother; Seizures in her mother; Stroke in her brother; Thyroid disease in her sister. There is no history of Colon cancer or Breast cancer. ROS:   Please see the history of present illness.    All other systems reviewed and are negative.  EKGs/Labs/Other Studies Reviewed:    The following studies were reviewed today:   Recent Labs: 11/08/2020: ALT 12; BUN 15; Potassium 4.0; Sodium 143 11/09/2020: Creatinine, Ser 0.86; Hemoglobin 13.5; Platelets 250  Recent Lipid Panel    Component Value Date/Time   CHOL 144 11/09/2020 0149   CHOL 137 10/03/2020 0947   TRIG 93 11/09/2020 0149   HDL 54 11/09/2020 0149   HDL 49 10/03/2020 0947   CHOLHDL 2.7 11/09/2020 0149   VLDL 19 11/09/2020 0149   LDLCALC 71 11/09/2020 0149   LDLCALC 69 10/03/2020 0947     Physical Exam:    VS:  BP (!) 150/73   Pulse 64   Ht 5\' 3"  (1.6 m)   Wt 142 lb 12.8 oz (64.8 kg)   SpO2 98%   BMI 25.30 kg/m     Wt Readings from Last 3 Encounters:  04/02/21 142 lb 12.8 oz (64.8 kg)  12/23/20 146 lb (66.2 kg)  12/11/20 141 lb (64 kg)     GEN:  Well nourished, well developed in no acute distress HEENT: Normal NECK: No JVD; No carotid bruits LYMPHATICS: No lymphadenopathy CARDIAC: RRR, no murmurs, rubs, gallops RESPIRATORY:  Clear to auscultation without rales, wheezing or rhonchi  ABDOMEN: Soft, non-tender, non-distended MUSCULOSKELETAL:  No edema; No deformity  SKIN: Warm and dry NEUROLOGIC:  Alert and oriented x 3  PSYCHIATRIC:  Normal affect    Signed, Norman Herrlich, MD  04/02/2021 9:27 AM    [facility]

## 2021-04-02 ENCOUNTER — Encounter: Payer: Self-pay | Admitting: Cardiology

## 2021-04-02 ENCOUNTER — Ambulatory Visit (INDEPENDENT_AMBULATORY_CARE_PROVIDER_SITE_OTHER): Payer: BC Managed Care – PPO | Admitting: Cardiology

## 2021-04-02 ENCOUNTER — Other Ambulatory Visit: Payer: Self-pay

## 2021-04-02 VITALS — BP 150/73 | HR 64 | Ht 63.0 in | Wt 142.8 lb

## 2021-04-02 DIAGNOSIS — E7841 Elevated Lipoprotein(a): Secondary | ICD-10-CM

## 2021-04-02 DIAGNOSIS — E7800 Pure hypercholesterolemia, unspecified: Secondary | ICD-10-CM

## 2021-04-02 DIAGNOSIS — I2510 Atherosclerotic heart disease of native coronary artery without angina pectoris: Secondary | ICD-10-CM | POA: Diagnosis not present

## 2021-04-02 MED ORDER — CLOPIDOGREL BISULFATE 75 MG PO TABS: 75.0000 mg | ORAL_TABLET | Freq: Every day | ORAL | 3 refills | Status: DC

## 2021-04-02 NOTE — Patient Instructions (Signed)
Medication Instructions:  Your physician has recommended you make the following change in your medication:  STOP: Brillinta  START: Plavix 75 mg take one tablet by mouth daily. On the first two days please take two tablets daily.  *If you need a refill on your cardiac medications before your next appointment, please call your pharmacy*   Lab Work: Your physician recommends that you return for lab work in: TODAY CMP, Lipids, Lpa If you have labs (blood work) drawn today and your tests are completely normal, you will receive your results only by: MyChart Message (if you have MyChart) OR A paper copy in the mail If you have any lab test that is abnormal or we need to change your treatment, we will call you to review the results.   Testing/Procedures: None   Follow-Up: At [facility], you and your health needs are our priority.  As part of our continuing mission to provide you with exceptional heart care, we have created designated Provider Care Teams.  These Care Teams include your primary Cardiologist (physician) and Advanced Practice Providers (APPs -  Physician Assistants and Nurse Practitioners) who all work together to provide you with the care you need, when you need it.  We recommend signing up for the patient portal called "MyChart".  Sign up information is provided on this After Visit Summary.  MyChart is used to connect with patients for Virtual Visits (Telemedicine).  Patients are able to view lab/test results, encounter notes, upcoming appointments, etc.  Non-urgent messages can be sent to your provider as well.   To learn more about what you can do with MyChart, go to [email or web address].    Your next appointment:   6 month(s)  The format for your next appointment:   In Person  Provider:   Norman Herrlich, MD   Other Instructions

## 2021-04-03 LAB — COMPREHENSIVE METABOLIC PANEL
ALT: 8 IU/L (ref 0–32)
AST: 21 IU/L (ref 0–40)
Albumin/Globulin Ratio: 1.8 (ref 1.2–2.2)
Albumin: 5 g/dL — ABNORMAL HIGH (ref 3.8–4.9)
Alkaline Phosphatase: 116 IU/L (ref 44–121)
BUN/Creatinine Ratio: 14 (ref 9–23)
BUN: 14 mg/dL (ref 6–24)
Bilirubin Total: 0.3 mg/dL (ref 0.0–1.2)
CO2: 21 mmol/L (ref 20–29)
Calcium: 10.2 mg/dL (ref 8.7–10.2)
Chloride: 103 mmol/L (ref 96–106)
Creatinine, Ser: 1.02 mg/dL — ABNORMAL HIGH (ref 0.57–1.00)
Globulin, Total: 2.8 g/dL (ref 1.5–4.5)
Glucose: 93 mg/dL (ref 65–99)
Potassium: 5.3 mmol/L — ABNORMAL HIGH (ref 3.5–5.2)
Sodium: 142 mmol/L (ref 134–144)
Total Protein: 7.8 g/dL (ref 6.0–8.5)
eGFR: 64 mL/min/{1.73_m2} (ref >59)

## 2021-04-03 LAB — LIPID PANEL
Chol/HDL Ratio: 2.7 ratio (ref 0.0–4.4)
Cholesterol, Total: 175 mg/dL (ref 100–199)
HDL: 66 mg/dL (ref >39)
LDL Chol Calc (NIH): 86 mg/dL (ref 0–99)
Triglycerides: 130 mg/dL (ref 0–149)
VLDL Cholesterol Cal: 23 mg/dL (ref 5–40)

## 2021-04-03 LAB — LIPOPROTEIN A (LPA): Lipoprotein (a): 364.2 nmol/L — ABNORMAL HIGH (ref <75.0)

## 2021-05-30 DIAGNOSIS — Z79899 Other long term (current) drug therapy: Secondary | ICD-10-CM | POA: Diagnosis not present

## 2021-05-30 DIAGNOSIS — Z8673 Personal history of transient ischemic attack (TIA), and cerebral infarction without residual deficits: Secondary | ICD-10-CM | POA: Diagnosis not present

## 2021-05-30 DIAGNOSIS — I739 Peripheral vascular disease, unspecified: Secondary | ICD-10-CM | POA: Diagnosis not present

## 2021-05-30 DIAGNOSIS — I2510 Atherosclerotic heart disease of native coronary artery without angina pectoris: Secondary | ICD-10-CM | POA: Diagnosis not present

## 2021-05-30 DIAGNOSIS — I10 Essential (primary) hypertension: Secondary | ICD-10-CM | POA: Diagnosis not present

## 2021-06-06 DIAGNOSIS — N6310 Unspecified lump in the right breast, unspecified quadrant: Secondary | ICD-10-CM | POA: Diagnosis not present

## 2021-06-06 DIAGNOSIS — Z7901 Long term (current) use of anticoagulants: Secondary | ICD-10-CM | POA: Diagnosis not present

## 2021-06-06 DIAGNOSIS — N644 Mastodynia: Secondary | ICD-10-CM | POA: Diagnosis not present

## 2021-06-06 DIAGNOSIS — N6315 Unspecified lump in the right breast, overlapping quadrants: Secondary | ICD-10-CM | POA: Diagnosis not present

## 2021-06-16 ENCOUNTER — Encounter: Payer: Self-pay | Admitting: Adult Health

## 2021-06-16 ENCOUNTER — Ambulatory Visit (INDEPENDENT_AMBULATORY_CARE_PROVIDER_SITE_OTHER): Payer: BC Managed Care – PPO | Admitting: Adult Health

## 2021-06-16 VITALS — BP 128/64 | HR 60 | Ht 63.0 in | Wt 145.4 lb

## 2021-06-16 DIAGNOSIS — G459 Transient cerebral ischemic attack, unspecified: Secondary | ICD-10-CM | POA: Diagnosis not present

## 2021-06-16 NOTE — Patient Instructions (Addendum)
Continue aspirin 81 mg daily and clopidogrel 75 mg daily  and Repatha  for secondary stroke prevention  Follow up with Dr. Myra Gianotti as scheduled for repeat carotid ultrasound for monitoring of carotid stenosis  Continue to follow routinely by cardiology as scheduled  Continue to follow up with PCP/cardiology regarding cholesterol and blood pressure management  Maintain strict control of hypertension with blood pressure goal below 130/90 and cholesterol with LDL cholesterol (bad cholesterol) goal below 70 mg/dL.        Thank you for coming to see [place] at [facility]. I hope we have been able to provide you high quality care today.  You may receive a patient satisfaction survey over the next few weeks. We would appreciate your feedback and comments so that we may continue to improve ourselves and the health of our patients.

## 2021-06-16 NOTE — Progress Notes (Signed)
[facility] [address] [place]. [place] [address] [phone]       HOSPITAL FOLLOW UP NOTE  [name] Date of Birth:  April 16, 1961 Medical Record Number:  [id number]   Reason for Referral:  hospital stroke follow up    SUBJECTIVE:   CHIEF COMPLAINT:  Chief Complaint  Patient presents with   Follow-up    Rm 2 here for 6 month f/u- Pt reports she has been doing well.      HPI:   [name] is a 60 y.o. female with history of CAD, angina r/t vasospasm s/p PCI and stenting Prox Right ICA on DAPT Jan 2022 by Dr.Munley, right iliac artery stenosis s/p stenting (2003), hypercholesterolemia with known statin intolerance on Repatha managed by Dr. Dulce Sellar and PVD who presented on 11/08/2020 with transient sudden onset of right leg weakness and numbness possibly in setting of left brain TIA in setting of moderate left calcific carotid stenosis on ultrasound although CTA did not confirm this.  Vascular surgery consulted and recommended outpatient follow-up.  MRI negative for acute stroke.  Recommended aspirin and Brilinta for 30 days then single agent alone. LDL 71 on Repatha with history of statin intolerance.  Other stroke risk factors include family history of stroke, CAD and migraines.  Evaluated by therapies and discharged home in stable condition without therapy needs.   Initial visit 12/11/2020 JM: Alyssa Ritter is being seen for hospital follow-up unaccompanied She has been doing well since discharge without new or recurring stroke/TIA symptoms She has returned back to all prior activities without difficulty  Has remained on aspirin and Brilinta with mild bruising but no other associated side effects Continues on Repatha without associated signs Blood pressure today 122/69 -monitors at home and has been stable  Scheduled follow-up visit with Dr. Myra Gianotti on 4/25  No concerns at this time  Update 06/16/2021 JM: Returns for 51-month  TIA follow-up.  Overall doing well.  Denies new or reoccurring stroke/TIA symptoms.  Recent follow-up visit with cardiologist Dr. Dulce Sellar (03/2021) who transitioned Brilinta to Plavix in addition to continued aspirin for stroke prevention and history of CAD which she has tolerated without side effects.  Remains on Repatha without side effects.  Blood pressure today 128/64.  Plans on repeat carotid ultrasound on 11/7 for carotid stenosis- followed by Dr. Myra Gianotti.  No new concerns at this time      ROS:   14 system review of systems performed and negative with exception of no complaints  PMH:  Past Medical History:  Diagnosis Date   Anxiety disorder    Benign essential tremor    CAD (coronary artery disease)    Carotid artery occlusion    Constipation    Depression with anxiety    Hematuria    Hypercholesteremia    Peripheral vascular disease of extremity (HCC)    Weight loss, abnormal     PSH:  Past Surgical History:  Procedure Laterality Date   CESAREAN SECTION     CORONARY STENT INTERVENTION N/A 09/06/2020   Procedure: CORONARY STENT INTERVENTION;  Surgeon: Marykay Lex, MD;  Location: [facility] INVASIVE CV LAB;  Service: Cardiovascular;  Laterality: N/A;   LEFT HEART CATH AND CORONARY ANGIOGRAPHY N/A 09/06/2020   Procedure: LEFT HEART CATH AND CORONARY ANGIOGRAPHY;  Surgeon: Marykay Lex, MD;  [facility]: [facility] INVASIVE CV LAB;  Service: Cardiovascular;  Laterality: N/A;   OOPHORECTOMY Bilateral    Right cyst   ROTATOR CUFF REPAIR Left 2006  Social History:  Social History   Socioeconomic History   Marital status: Married    Spouse name: Not on file   Number of children: Not on file   Years of education: Not on file   Highest education level: Not on file  Occupational History   Not on file  Tobacco Use   Smoking status: Former    Types: Cigarettes    Quit date: 01/2018    Years since quitting: 3.3   Smokeless tobacco: Current  Vaping Use   Vaping Use: Former   Quit  date: 01/29/2018  Substance and Sexual Activity   Alcohol use: Yes    Comment: occasionally   Drug use: Never   Sexual activity: Not on file  Other Topics Concern   Not on file  Social History Narrative   Not on file   Social Determinants of Health   Financial Resource Strain: Not on file  Food Insecurity: Not on file  Transportation Needs: Not on file  Physical Activity: Not on file  Stress: Not on file  Social Connections: Not on file  Intimate Partner Violence: Not on file    Family History:  Family History  Problem Relation Age of Onset   Colon polyps Mother    Hypertension Mother    Hypercholesterolemia Mother    Arthritis Mother    Seizures Mother    Hypertension Father    CAD Father        died of complications of AML   Hypercholesterolemia Father    Thyroid disease Sister    Arthritis Sister    Hypertension Brother    Stroke Brother    Colon cancer Neg Hx    Breast cancer Neg Hx     Medications:   Current Outpatient Medications on File Prior to Visit  Medication Sig Dispense Refill   aspirin EC 81 MG tablet Take 81 mg by mouth at bedtime. Swallow whole.     clonazePAM (KLONOPIN) 1 MG tablet Take 0.5-1 mg by mouth 2 (two) times daily as needed for anxiety.     clopidogrel (PLAVIX) 75 MG tablet Take 1 tablet (75 mg total) by mouth daily. 90 tablet 3   escitalopram (LEXAPRO) 20 MG tablet Take 20 mg by mouth at bedtime.     nitroGLYCERIN (NITROSTAT) 0.4 MG SL tablet Place 1 tablet (0.4 mg total) under the tongue every 5 (five) minutes as needed for chest pain. 30 tablet 0   traZODone (DESYREL) 50 MG tablet Take 50 mg by mouth at bedtime.     diltiazem (CARDIZEM CD) 180 MG 24 hr capsule Take 1 capsule (180 mg total) by mouth daily. 90 capsule 3   [DISCONTINUED] carvedilol (COREG) 3.125 MG tablet Take 1 tablet (3.125 mg total) by mouth 2 (two) times daily with a meal. 60 tablet 1   No current facility-administered medications on file prior to visit.     Allergies:   Allergies  Allergen Reactions   Penicillins Anaphylaxis   Statins     Kidney Injury      OBJECTIVE:  Physical Exam  Vitals:   06/16/21 0904  BP: 128/64  Pulse: 60  SpO2: 99%  Weight: 145 lb 6 oz (65.9 kg)  Height: 5\' 3"  (1.6 m)    Body mass index is 25.75 kg/m. No results found.  General: well developed, well nourished,  very pleasant middle-aged Caucasian female, seated, in no evident distress Head: head normocephalic and atraumatic.   Neck: supple with no carotid or supraclavicular bruits Cardiovascular:  regular rate and rhythm, no murmurs Musculoskeletal: no deformity Skin:  no rash/petichiae Vascular:  Normal pulses all extremities   Neurologic Exam Mental Status: Awake and fully alert.   Fluent speech and language.  Oriented to place and time. Recent and remote memory intact. Attention span, concentration and fund of knowledge appropriate. Mood and affect appropriate.  Cranial Nerves: Pupils equal, briskly reactive to light. Extraocular movements full without nystagmus. Visual fields full to confrontation. Hearing intact. Facial sensation intact. Face, tongue, palate moves normally and symmetrically.  Motor: Normal bulk and tone. Normal strength in all tested extremity muscles Sensory.: intact to touch , pinprick , position and vibratory sensation.  Coordination: Rapid alternating movements normal in all extremities. Finger-to-nose and heel-to-shin performed accurately bilaterally. Gait and Station: Arises from chair without difficulty. Stance is normal. Gait demonstrates normal stride length and balance without assistive device. Tandem walk and heel toe without difficulty.  Reflexes: 1+ and symmetric. Toes downgoing.         ASSESSMENT: Alyssa Ritter is a 60 y.o. year old female presented with sudden onset of transient right leg weakness and numbness on 11/08/2020 possibly in setting of TIA with carotid duplex showing left ICA 60 to  70% stenosis however CTA neck did not confirm this.  Vascular risk factors include HLD, CAD, angina r/t vasospasm s/p PCI and stenting proximal RCA on DAPT 08/2020 and right iliac artery stenosis s/p stenting 2003.      PLAN:  TIA :  Continue aspirin 81 mg daily and clopidogrel 75 mg daily and Repatha for secondary stroke prevention and history of CAD Discussed secondary stroke prevention measures and importance of close PCP follow up for aggressive stroke risk factor management  HTN: BP goal <130/90.  Stable on current regimen per PCP/cardiology HLD: LDL goal <70. Recent LDL 86 on Repatha per cardiology.  Carotid stenosis: Followed by [facility] Dr. Myra Gianotti.  Scheduled for repeat carotid ultrasound 07/07/2021 CAD: Followed by cardiology Dr. Dulce Sellar    Overall stable from a stroke standpoint and recommend follow-up on an as-needed basis which patient agreed with   CC:  PCP: Lonie Peak, PA-C    I spent 26 minutes of face-to-face and non-face-to-face time with patient.  This included previsit chart review, lab review, study review, electronic health record documentation, patient education regarding prior TIA, carotid stenosis, importance of managing stroke risk factors and secondary stroke prevention measures and answered all other questions to patient satisfaction  Ihor Austin, [facility]  [facility] [address] [place], [place] [address]  Phone [phone] Fax [phone] Note: This document was prepared with digital dictation and possible smart phrase technology. Any transcriptional errors that result from this process are unintentional.

## 2021-06-30 ENCOUNTER — Other Ambulatory Visit: Payer: Self-pay

## 2021-06-30 MED ORDER — EVOLOCUMAB 140 MG/ML ~~LOC~~ SOAJ: 140.0000 mg | SUBCUTANEOUS | 12 refills | Status: DC

## 2021-07-07 ENCOUNTER — Ambulatory Visit (HOSPITAL_COMMUNITY)
Admission: RE | Admit: 2021-07-07 | Discharge: 2021-07-07 | Disposition: A | Discharge status: Home / Self Care | Payer: BC Managed Care – PPO | Source: Ambulatory Visit | Attending: Vascular Surgery | Admitting: Vascular Surgery

## 2021-07-07 ENCOUNTER — Other Ambulatory Visit: Payer: Self-pay

## 2021-07-07 ENCOUNTER — Encounter: Payer: Self-pay | Admitting: Physician Assistant

## 2021-07-07 ENCOUNTER — Ambulatory Visit (INDEPENDENT_AMBULATORY_CARE_PROVIDER_SITE_OTHER): Payer: BC Managed Care – PPO | Admitting: Physician Assistant

## 2021-07-07 VITALS — BP 120/70 | HR 55 | Temp 97.9°F | Resp 20 | Ht 63.0 in | Wt 142.4 lb

## 2021-07-07 DIAGNOSIS — I6522 Occlusion and stenosis of left carotid artery: Secondary | ICD-10-CM | POA: Insufficient documentation

## 2021-07-07 NOTE — Progress Notes (Signed)
HISTORY AND PHYSICAL     CC:  follow up. Requesting Provider:  Cyndi Bender, PA-C  HPI: This is a 60 y.o. female here for follow up for carotid artery stenosis.  She presented to the hospital on 11/08/2020 with right leg numbness and weakness.  Her work-up included a MRI which was negative for stroke.  A carotid duplex showed a 60-79% left-sided carotid stenosis.  This was followed up with a CT angiogram of the neck that showed minimal left-sided stenosis.  After reviewing the images I felt that the stenosis was less than 50% and so no intervention was recommended.  The stroke service changed her from Plavix to [place].  She is here today for follow-up.  She has not had any other episodes.   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  She has a nonobstructive arteriogram from 2005.  She has coronary CT scan in January 2022 that showed progressive coronary artery disease.  Angiography showed severe single-vessel disease in the right coronary.  She had PCI performed.  She reports a right iliac stent in 2004.  She denies claudication symptoms.  She has a statin allergy and is now taking Repatha.  She is no longer smoking.  Pt was last seen 12/23/2020 and at that time and it was felt the etiology of her TIA was unclear.  The u/s overestimated the degree of stenosis on the left.  There was posterior plaque but not hemodynamically significant.  Pt returns today for follow up.    Pt denies any amaurosis fugax, speech difficulties, weakness, numbness, paralysis or clumsiness or facial droop.  She is compliant with her plavix/asa and repatha.     The pt is on Repatha for cholesterol management.  The pt is on a daily aspirin.   Other AC:  Plavix The pt is on CCB for hypertension.   The pt is not diabetic.   Tobacco hx:  former  Pt does not have family hx of AAA.  Past Medical History:  Diagnosis Date   Anxiety disorder    Benign essential tremor    CAD  (coronary artery disease)    Carotid artery occlusion    Constipation    Depression with anxiety    Hematuria    Hypercholesteremia    Peripheral vascular disease of extremity (HCC)    Weight loss, abnormal     Past Surgical History:  Procedure Laterality Date   CESAREAN SECTION     CORONARY STENT INTERVENTION N/A 09/06/2020   Procedure: CORONARY STENT INTERVENTION;  Surgeon: Leonie Man, MD;  Location: [facility];  Service: Cardiovascular;  Laterality: N/A;   LEFT HEART CATH AND CORONARY ANGIOGRAPHY N/A 09/06/2020   Procedure: LEFT HEART CATH AND CORONARY ANGIOGRAPHY;  Surgeon: Leonie Man, MD;  Location: [facility];  Service: Cardiovascular;  Laterality: N/A;   OOPHORECTOMY Bilateral    Right cyst   ROTATOR CUFF REPAIR Left 2006    Allergies  Allergen Reactions   Penicillins Anaphylaxis   Statins     Kidney Injury    Current Outpatient Medications  Medication Sig Dispense Refill   aspirin EC 81 MG tablet Take 81 mg by mouth at bedtime. Swallow whole.     clonazePAM (KLONOPIN) 1 MG tablet Take 0.5-1 mg by mouth 2 (two) times daily as needed for anxiety.     clopidogrel (PLAVIX) 75 MG tablet Take 1 tablet (75 mg total) by mouth daily. 90 tablet  3   diltiazem (CARDIZEM CD) 180 MG 24 hr capsule Take 1 capsule (180 mg total) by mouth daily. 90 capsule 3   escitalopram (LEXAPRO) 20 MG tablet Take 20 mg by mouth at bedtime.     Evolocumab 140 MG/ML SOAJ Inject 140 mg into the skin every 14 (fourteen) days. 2 mL 12   nitroGLYCERIN (NITROSTAT) 0.4 MG SL tablet Place 1 tablet (0.4 mg total) under the tongue every 5 (five) minutes as needed for chest pain. 30 tablet 0   traZODone (DESYREL) 50 MG tablet Take 50 mg by mouth at bedtime.     No current facility-administered medications for this visit.    Family History  Problem Relation Age of Onset   Colon polyps Mother    Hypertension Mother    Hypercholesterolemia Mother    Arthritis Mother    Seizures  Mother    Hypertension Father    CAD Father        died of complications of AML   Hypercholesterolemia Father    Thyroid disease Sister    Arthritis Sister    Hypertension Brother    Stroke Brother    Colon cancer Neg Hx    Breast cancer Neg Hx     Social History   Socioeconomic History   Marital status: Married    Spouse name: Not on file   Number of children: Not on file   Years of education: Not on file   Highest education level: Not on file  Occupational History   Not on file  Tobacco Use   Smoking status: Former    Types: Cigarettes    Quit date: 01/2018    Years since quitting: 3.4   Smokeless tobacco: Current  Vaping Use   Vaping Use: Former   Quit date: 01/29/2018  Substance and Sexual Activity   Alcohol use: Yes    Comment: occasionally   Drug use: Never   Sexual activity: Not on file  Other Topics Concern   Not on file  Social History Narrative   Not on file   Social Determinants of Health   Financial Resource Strain: Not on file  Food Insecurity: Not on file  Transportation Needs: Not on file  Physical Activity: Not on file  Stress: Not on file  Social Connections: Not on file  Intimate Partner Violence: Not on file     REVIEW OF SYSTEMS:   [X]  denotes positive finding, [ ]  denotes negative finding Cardiac  Comments:  Chest pain or chest pressure:    Shortness of breath upon exertion:    Short of breath when lying flat:    Irregular heart rhythm:        Vascular    Pain in calf, thigh, or hip brought on by ambulation:    Pain in feet at night that wakes you up from your sleep:     Blood clot in your veins:    Leg swelling:         Pulmonary    Oxygen at home:    Productive cough:     Wheezing:         Neurologic    Sudden weakness in arms or legs:     Sudden numbness in arms or legs:     Sudden onset of difficulty speaking or slurred speech:    Temporary loss of vision in one eye:     Problems with dizziness:          Gastrointestinal    Blood  in stool:     Vomited blood:         Genitourinary    Burning when urinating:     Blood in urine:        Psychiatric    Major depression:         Hematologic    Bleeding problems:    Problems with blood clotting too easily:        Skin    Rashes or ulcers:        Constitutional    Fever or chills:      PHYSICAL EXAMINATION:  Today's Vitals   07/07/21 1148 07/07/21 1151  BP: 120/62 120/70  Pulse: (!) 55   Resp: 20   Temp: 97.9 F (36.6 C)   TempSrc: Temporal   SpO2: 99%   Weight: 142 lb 6.4 oz (64.6 kg)   Height: 5\' 3"  (1.6 m)    Body mass index is 25.23 kg/m.   General:  WDWN in NAD; vital signs documented above Gait: Not observed HENT: WNL, normocephalic Pulmonary: normal non-labored breathing Cardiac: regular HR, without carotid bruits Abdomen: soft, NT; aortic pulse is not palpable Skin: without rashes Vascular Exam/Pulses:  Right Left  Radial 2+ (normal) 2+ (normal)  Popliteal Unable to palpate Unable to palpate  AT 1+ (weak) 1+ (weak)  PT Unable to palpate Unable to palpate   Extremities: without ischemic changes, without Gangrene , without cellulitis; without open wounds Musculoskeletal: no muscle wasting or atrophy  Neurologic: A&O X 3; moving all extremities equally; speech is fluent/normal Psychiatric:  The pt has Normal affect.   Non-Invasive Vascular Imaging:   Carotid Duplex on 07/07/2021: Right:  1-39% ICA stenosis Left:  60-79% ICA stenosis Vertebrals:  Bilateral vertebral arteries demonstrate antegrade flow.  Subclavians: Normal flow hemodynamics were seen in bilateral subclavian arteries.  Previous Carotid duplex on 11/09/2020: Right: 1-39% ICA stenosis Left:   60-79% ICA stenosis    ASSESSMENT/PLAN:: 60 y.o. female here for follow up carotid artery stenosis that is followed by Dr. 41  -duplex today reveals it is basically unchanged from u/s in March 2022.  Discussed with Dr. April 2022 who reviewed  pt's CT scan from back in March and he felt that this was overestimated on duplex.  He feels it may be overestimated due to tortuosity.  We will continue to follow this every 6 months and if it advances, she may need new CTA to determine degree of stenosis.  -discussed s/s of stroke with pt and she understands should she develop any of these sx, she will go to the nearest ER or call 911. -pt will f/u in 6 months with carotid duplex -pt will call sooner should they have any issues. -continue asa/repatha/plavix  -per Dr. April, on CT scan, it was revealed that she had a 1.5 mm left supraclinoid ICA infundibulum versus aneurysm.  Discussed with pt that this is intracranial and neurology would follow this and may need evaluation by interventional radiology or neurolgy.  She will f/u with neurology on the significance of this finding.    Myra Gianotti, [facility] [phone]  Clinic MD:  [phone]

## 2021-07-08 ENCOUNTER — Other Ambulatory Visit: Payer: Self-pay

## 2021-07-08 DIAGNOSIS — I6522 Occlusion and stenosis of left carotid artery: Secondary | ICD-10-CM

## 2021-07-16 DIAGNOSIS — N200 Calculus of kidney: Secondary | ICD-10-CM | POA: Diagnosis not present

## 2021-07-16 DIAGNOSIS — Z6825 Body mass index (BMI) 25.0-25.9, adult: Secondary | ICD-10-CM | POA: Diagnosis not present

## 2021-07-16 DIAGNOSIS — R739 Hyperglycemia, unspecified: Secondary | ICD-10-CM | POA: Diagnosis not present

## 2021-07-18 ENCOUNTER — Ambulatory Visit (HOSPITAL_BASED_OUTPATIENT_CLINIC_OR_DEPARTMENT_OTHER): Payer: BC Managed Care – PPO

## 2021-07-18 ENCOUNTER — Encounter (HOSPITAL_BASED_OUTPATIENT_CLINIC_OR_DEPARTMENT_OTHER): Payer: Self-pay

## 2021-07-18 ENCOUNTER — Other Ambulatory Visit: Payer: Self-pay | Admitting: Physician Assistant

## 2021-07-18 ENCOUNTER — Other Ambulatory Visit (HOSPITAL_COMMUNITY): Payer: Self-pay | Admitting: Physician Assistant

## 2021-07-18 DIAGNOSIS — N200 Calculus of kidney: Secondary | ICD-10-CM

## 2021-09-22 ENCOUNTER — Other Ambulatory Visit: Payer: Self-pay | Admitting: Cardiology

## 2021-09-22 DIAGNOSIS — I2510 Atherosclerotic heart disease of native coronary artery without angina pectoris: Secondary | ICD-10-CM

## 2021-09-22 DIAGNOSIS — E7800 Pure hypercholesterolemia, unspecified: Secondary | ICD-10-CM

## 2021-09-22 DIAGNOSIS — R5381 Other malaise: Secondary | ICD-10-CM

## 2021-09-30 ENCOUNTER — Telehealth: Payer: Self-pay

## 2021-09-30 NOTE — Telephone Encounter (Signed)
Called and spoke to pt who stated that they did not have drug insurance so I offered to mail repatha pt assistance forms to them. I instructed them to complete the yellow portions and mail back to return address on the envelope. Pt voiced understanding.

## 2021-10-02 NOTE — Progress Notes (Signed)
Cardiology Office Note:    Date:  10/03/2021   ID:  Alyssa Ritter, Alyssa Ritter [birth year], MRN [id number]  PCP:  Cyndi Bender, PA-C  Cardiologist:  Shirlee More, MD    Referring MD: Cyndi Bender, PA-C    ASSESSMENT:    1. Coronary artery disease involving native heart, unspecified vessel or lesion type, unspecified whether angina present   2. Hypercholesteremia   3. Elevated lipoprotein(a)   4. Peripheral vascular disease of extremity (HCC)    PLAN:    In order of problems listed above:  Stable CAD she will continue medical therapy with dual antiplatelet I think she is a high risk population along with lipid-lowering and rate slowing calcium channel blocker for hypertension Recheck her LP(a) level I think she would benefit from specific therapy and I will refer to clinical trial Stable PAD having no claudication   Next appointment: 65-month   Medication Adjustments/Labs and Tests Ordered: Current medicines are reviewed at length with the patient today.  Concerns regarding medicines are outlined above.  No orders of the defined types were placed in this encounter.  No orders of the defined types were placed in this encounter.   No chief complaint on file.   History of Present Illness:    Alyssa Ritter is a 61 y.o. female with a hx of CAD with PCI and stent ostial proximal right coronary artery and residual 40% left main coronary artery stenosis 09/06/2020 familial hyperlipidemia and hyper LP(a) statin intolerance  peripheral arterial disease with previous right common iliac artery stent 10/03/2020 last seen 04/02/2021. Compliance with diet, lifestyle and medications: Yes  She finds her self under stress at home but has not had exercise intolerance edema shortness of breath chest pain palpitation or syncope She has absolutely statin intolerant she had profound weakness myopathy and tolerates her PCSK9 inhibitor without side effects She has good healthcare  literacy and is interested in clinical trial [place] study She has elevated LP(a):  Component Ref Range & Units 6 mo ago 12 mo ago  Lipoprotein (a) <75.0 nmol/L 364.2 High   233.1 High  CM    Past Medical History:  Diagnosis Date   Abnormal cardiac CT angiography 09/06/2020   Anxiety disorder    Benign essential tremor    CAD (coronary artery disease)    Carotid artery occlusion    Constipation    Coronary artery disease involving native coronary artery of native heart with angina pectoris (HCC)    Depression with anxiety    Hematuria    Hypercholesteremia    Peripheral vascular disease of extremity ([place])    Progressive angina ([place]) 09/06/2020   TIA (transient ischemic attack) 11/08/2020   Weight loss, abnormal     Past Surgical History:  Procedure Laterality Date   CESAREAN SECTION     CORONARY STENT INTERVENTION N/A 09/06/2020   Procedure: CORONARY STENT INTERVENTION;  Surgeon: Leonie Man, MD;  Location: [facility];  Service: Cardiovascular;  Laterality: N/A;   LEFT HEART CATH AND CORONARY ANGIOGRAPHY N/A 09/06/2020   Procedure: LEFT HEART CATH AND CORONARY ANGIOGRAPHY;  Surgeon: Leonie Man, MD;  Location: [facility];  Service: Cardiovascular;  Laterality: N/A;   OOPHORECTOMY Bilateral    Right cyst   ROTATOR CUFF REPAIR Left 2006    Current Medications: No outpatient medications have been marked as taking for the 10/03/21 encounter (Office Visit) with Richardo Priest, MD.     Allergies:   Penicillins and Statins  Social History   Socioeconomic History   Marital status: Married    Spouse name: Not on file   Number of children: Not on file   Years of education: Not on file   Highest education level: Not on file  Occupational History   Not on file  Tobacco Use   Smoking status: Former    Types: Cigarettes    Quit date: 01/2018    Years since quitting: 3.6    Passive exposure: Current   Smokeless tobacco: Former    Quit date: 08/31/2017  Vaping  Use   Vaping Use: Former   Quit date: 01/29/2018  Substance and Sexual Activity   Alcohol use: Yes    Comment: occasionally   Drug use: Never   Sexual activity: Not on file  Other Topics Concern   Not on file  Social History Narrative   Not on file   Social Determinants of Health   Financial Resource Strain: Not on file  Food Insecurity: Not on file  Transportation Needs: Not on file  Physical Activity: Not on file  Stress: Not on file  Social Connections: Not on file     Family History: The patient's family history includes Arthritis in her mother and sister; CAD in her father; Colon polyps in her mother; Hypercholesterolemia in her father and mother; Hypertension in her brother, father, and mother; Seizures in her mother; Stroke in her brother; Thyroid disease in her sister. There is no history of Colon cancer or Breast cancer. ROS:   Please see the history of present illness.    All other systems reviewed and are negative.  EKGs/Labs/Other Studies Reviewed:    The following studies were reviewed today:  She is going to return her office to do the EKG she had an appointment  Recent Labs: 11/09/2020: Hemoglobin 13.5; Platelets 250 04/02/2021: ALT 8; BUN 14; Creatinine, Ser 1.02; Potassium 5.3; Sodium 142  Recent Lipid Panel    Component Value Date/Time   CHOL 175 04/02/2021 0941   TRIG 130 04/02/2021 0941   HDL 66 04/02/2021 0941   CHOLHDL 2.7 04/02/2021 0941   CHOLHDL 2.7 11/09/2020 0149   VLDL 19 11/09/2020 0149   LDLCALC 86 04/02/2021 0941    Physical Exam:    VS:  BP 138/64 (BP Location: Left Arm)    Pulse (!) 56    Ht 5\' 3"  (1.6 m)    Wt 142 lb (64.4 kg)    SpO2 98%    BMI 25.15 kg/m     Wt Readings from Last 3 Encounters:  10/03/21 142 lb (64.4 kg)  07/07/21 142 lb 6.4 oz (64.6 kg)  06/16/21 145 lb 6 oz (65.9 kg)     GEN:  Well nourished, well developed in no acute distress HEENT: Normal NECK: No JVD; No carotid bruits LYMPHATICS: No  lymphadenopathy CARDIAC: RRR, no murmurs, rubs, gallops RESPIRATORY:  Clear to auscultation without rales, wheezing or rhonchi  ABDOMEN: Soft, non-tender, non-distended MUSCULOSKELETAL:  No edema; No deformity  SKIN: Warm and dry NEUROLOGIC:  Alert and oriented x 3 PSYCHIATRIC:  Normal affect    Signed, Shirlee More, MD  10/03/2021 9:25 AM    [facility]

## 2021-10-03 ENCOUNTER — Encounter: Payer: Self-pay | Admitting: Cardiology

## 2021-10-03 ENCOUNTER — Other Ambulatory Visit: Payer: Self-pay

## 2021-10-03 ENCOUNTER — Ambulatory Visit (INDEPENDENT_AMBULATORY_CARE_PROVIDER_SITE_OTHER): Payer: Medicaid Other | Admitting: Cardiology

## 2021-10-03 ENCOUNTER — Telehealth: Payer: Self-pay | Admitting: Cardiology

## 2021-10-03 VITALS — BP 138/64 | HR 56 | Ht 63.0 in | Wt 142.0 lb

## 2021-10-03 DIAGNOSIS — I2510 Atherosclerotic heart disease of native coronary artery without angina pectoris: Secondary | ICD-10-CM | POA: Diagnosis not present

## 2021-10-03 DIAGNOSIS — E7800 Pure hypercholesterolemia, unspecified: Secondary | ICD-10-CM | POA: Diagnosis not present

## 2021-10-03 DIAGNOSIS — E7841 Elevated Lipoprotein(a): Secondary | ICD-10-CM | POA: Diagnosis not present

## 2021-10-03 DIAGNOSIS — I739 Peripheral vascular disease, unspecified: Secondary | ICD-10-CM | POA: Diagnosis not present

## 2021-10-03 NOTE — Patient Instructions (Signed)
Medication Instructions:  Your physician recommends that you continue on your current medications as directed. Please refer to the Current Medication list given to you today.  *If you need a refill on your cardiac medications before your next appointment, please call your pharmacy*   Lab Work: Your physician recommends that you return for lab work in: TODAY CMP, Lipids, Lpa If you have labs (blood work) drawn today and your tests are completely normal, you will receive your results only by: MyChart Message (if you have MyChart) OR A paper copy in the mail If you have any lab test that is abnormal or we need to change your treatment, we will call you to review the results.   Testing/Procedures: None   Follow-Up: At [facility], you and your health needs are our priority.  As part of our continuing mission to provide you with exceptional heart care, we have created designated Provider Care Teams.  These Care Teams include your primary Cardiologist (physician) and Advanced Practice Providers (APPs -  Physician Assistants and Nurse Practitioners) who all work together to provide you with the care you need, when you need it.  We recommend signing up for the patient portal called "MyChart".  Sign up information is provided on this After Visit Summary.  MyChart is used to connect with patients for Virtual Visits (Telemedicine).  Patients are able to view lab/test results, encounter notes, upcoming appointments, etc.  Non-urgent messages can be sent to your provider as well.   To learn more about what you can do with MyChart, go to [email or web address].    Your next appointment:   6 month(s)  The format for your next appointment:   In Person  Provider:   Brian Munley, MD   Other Instructions   

## 2021-10-03 NOTE — Telephone Encounter (Signed)
New Messag:    [facility]ona Huh called and said they will need a prior authorization for patient's Reppatha. They will fax you the form.

## 2021-10-03 NOTE — Telephone Encounter (Signed)
PA submitted at this time.

## 2021-10-04 LAB — COMPREHENSIVE METABOLIC PANEL WITH GFR
ALT: 7 IU/L (ref 0–32)
AST: 14 IU/L (ref 0–40)
Albumin/Globulin Ratio: 2.1 (ref 1.2–2.2)
Albumin: 4.7 g/dL (ref 3.8–4.9)
Alkaline Phosphatase: 88 IU/L (ref 44–121)
BUN/Creatinine Ratio: 14 (ref 9–23)
BUN: 14 mg/dL (ref 6–24)
Bilirubin Total: 0.2 mg/dL (ref 0.0–1.2)
CO2: 24 mmol/L (ref 20–29)
Calcium: 9.7 mg/dL (ref 8.7–10.2)
Chloride: 103 mmol/L (ref 96–106)
Creatinine, Ser: 1.02 mg/dL — ABNORMAL HIGH (ref 0.57–1.00)
Globulin, Total: 2.2 g/dL (ref 1.5–4.5)
Glucose: 87 mg/dL (ref 70–99)
Potassium: 4.7 mmol/L (ref 3.5–5.2)
Sodium: 141 mmol/L (ref 134–144)
Total Protein: 6.9 g/dL (ref 6.0–8.5)
eGFR: 64 mL/min/1.73

## 2021-10-04 LAB — LIPID PANEL
Chol/HDL Ratio: 2.9 ratio (ref 0.0–4.4)
Cholesterol, Total: 137 mg/dL (ref 100–199)
HDL: 47 mg/dL (ref >39)
LDL Chol Calc (NIH): 64 mg/dL (ref 0–99)
Triglycerides: 151 mg/dL — ABNORMAL HIGH (ref 0–149)
VLDL Cholesterol Cal: 26 mg/dL (ref 5–40)

## 2021-10-04 LAB — LIPOPROTEIN A (LPA): Lipoprotein (a): 149.8 nmol/L — ABNORMAL HIGH (ref <75.0)

## 2021-10-06 NOTE — Addendum Note (Signed)
Addended by: Eleonore Chiquito on: 10/06/2021 11:09 AM   Modules accepted: Orders

## 2021-10-09 NOTE — Telephone Encounter (Signed)
Spoke with a representative from [employer] and advised that PA was submitted on 2/3.

## 2021-10-09 NOTE — Telephone Encounter (Signed)
[facility] called to check the status of the PA for the Repatha

## 2021-10-13 ENCOUNTER — Telehealth: Payer: Self-pay | Admitting: Cardiology

## 2021-10-13 NOTE — Telephone Encounter (Signed)
Patient stated she has received the form for assistance with Repatha but is aware she does not qualify as her income don't meet the requirements. She is aware I submitted an appeal and attach office notes to assist with request. Office notes do specify patient has an intolerance to statins. She is aware it may take 72 hour for a decision.

## 2021-10-13 NOTE — Telephone Encounter (Signed)
Pt c/o medication issue:  1. Name of Medication: Evolocumab 140 MG/ML SOAJ  2. How are you currently taking this medication (dosage and times per day)? One injection every 14 days  3. Are you having a reaction (difficulty breathing--STAT)? no  4. What is your medication issue? Patient needs a Prior Authorization for this medication. Her insurance will not cover it.   She will need documentation from DR. Munley that she is allergic to toher statin drugs and that this is the only medication she can take

## 2021-10-14 NOTE — Telephone Encounter (Signed)
Called and lvm for pt to please come by the [place] office to complete the permission signatures so we may advocate on her behalf for an appeal. I emailed a copy of the forms to ms. [place] at the front desk to assist the pt with completing them.

## 2021-10-14 NOTE — Telephone Encounter (Signed)
Smiley Houseman cma for dr. Dulce Sellar sent me a denial for appeal request form because they need ms. Gugel signature on it for [place] to advocate on her behalf. I will route this back to her to see if she could have the patient come to the [place] office to sign the form.

## 2021-11-12 NOTE — Telephone Encounter (Signed)
Called and notified the pt that the repatha was now not needing a pa says pa not required and that verified that it is readily available at the pharmacy ?

## 2022-01-05 NOTE — Progress Notes (Signed)
?HISTORY AND PHYSICAL  ? ? ? ?CC:  follow up. ?Requesting Provider:  Lonie Peakonroy, Nathan, PA-C ? ?HPI: This is a 61 y.o. female here for follow up for carotid artery stenosis.   She presented to the hospital on 11/08/2020 with right leg numbness and weakness.  Her work-up included a MRI which was negative for stroke.  A carotid duplex showed a 60-79% left-sided carotid stenosis.  This was followed up with a CT angiogram of the neck that showed minimal left-sided stenosis.  After reviewing the images Dr. Myra GianottiBrabham felt that the stenosis was less than 50% and so no intervention was recommended.  The stroke service changed her from Plavix to Brilinta.  ? ?Patient has a history of angina that was felt to be secondary to coronary vasospasm.  She has a nonobstructive arteriogram from 2005.  She has coronary CT scan in January 2022 that showed progressive coronary artery disease.  Angiography showed severe single-vessel disease in the right coronary.  She had PCI performed.  She reports a right iliac stent in 2004.  ? ?Pt was last seen 07/07/2021 and at that time she was not having any neurologic symptoms.  She was compliant with plavix/asa/repatha. ? ?Pt returns today for follow up.   ? ?Pt denies any amaurosis fugax, speech difficulties, weakness, numbness, paralysis or clumsiness or facial droop.  She does not smoke as she quit about 10 years ago.  She denies any claudication, non healing wounds BLE.  She denies any dizziness or arm fatigue with reaching over head.  She states that her BP has been elevated and this is being monitored. ? ? ?The pt is on a Repatha for cholesterol management.  ?The pt is on a daily aspirin.   Other AC:  Plavix ?The pt is on CCB for hypertension.   ?The pt does not have diabetes ?Tobacco hx:  former ? ?Pt does not have family hx of AAA. ? ?Past Medical History:  ?Diagnosis Date  ? Abnormal cardiac CT angiography 09/06/2020  ? Anxiety disorder   ? Benign essential tremor   ? CAD (coronary artery  disease)   ? Carotid artery occlusion   ? Constipation   ? Coronary artery disease involving native coronary artery of native heart with angina pectoris (HCC)   ? Depression with anxiety   ? Hematuria   ? Hypercholesteremia   ? Peripheral vascular disease of extremity (HCC)   ? Progressive angina (HCC) 09/06/2020  ? TIA (transient ischemic attack) 11/08/2020  ? Weight loss, abnormal   ? ? ?Past Surgical History:  ?Procedure Laterality Date  ? CESAREAN SECTION    ? CORONARY STENT INTERVENTION N/A 09/06/2020  ? Procedure: CORONARY STENT INTERVENTION;  Surgeon: Marykay LexHarding, David W, MD;  [facility];  Service: Cardiovascular;  Laterality: N/A;  ? LEFT HEART CATH AND CORONARY ANGIOGRAPHY N/A 09/06/2020  ? Procedure: LEFT HEART CATH AND CORONARY ANGIOGRAPHY;  Surgeon: Marykay LexHarding, David W, MD;  [facility];  Service: Cardiovascular;  Laterality: N/A;  ? OOPHORECTOMY Bilateral   ? Right cyst  ? ROTATOR CUFF REPAIR Left 2006  ? ? ?Allergies  ?Allergen Reactions  ? Penicillins Anaphylaxis  ? Statins   ?  Kidney Injury  ? ? ?Current Outpatient Medications  ?Medication Sig Dispense Refill  ? aspirin EC 81 MG tablet Take 81 mg by mouth at bedtime. Swallow whole.    ? clonazePAM (KLONOPIN) 1 MG tablet Take 0.5-1 mg by mouth 2 (two) times daily as needed for  anxiety.    ? clopidogrel (PLAVIX) 75 MG tablet Take 1 tablet (75 mg total) by mouth daily. 90 tablet 3  ? diltiazem (CARDIZEM CD) 180 MG 24 hr capsule TAKE 1 CAPSULE(180 MG) BY MOUTH DAILY 90 capsule 3  ? escitalopram (LEXAPRO) 20 MG tablet Take 20 mg by mouth at bedtime.    ? Evolocumab 140 MG/ML SOAJ Inject 140 mg into the skin every 14 (fourteen) days. 2 mL 12  ? nitroGLYCERIN (NITROSTAT) 0.4 MG SL tablet Place 1 tablet (0.4 mg total) under the tongue every 5 (five) minutes as needed for chest pain. 30 tablet 0  ? traZODone (DESYREL) 50 MG tablet Take 50 mg by mouth at bedtime.    ? ?No current facility-administered medications for this visit.   ? ? ?Family History  ?Problem Relation Age of Onset  ? Colon polyps Mother   ? Hypertension Mother   ? Hypercholesterolemia Mother   ? Arthritis Mother   ? Seizures Mother   ? Hypertension Father   ? CAD Father   ?     died of complications of AML  ? Hypercholesterolemia Father   ? Thyroid disease Sister   ? Arthritis Sister   ? Hypertension Brother   ? Stroke Brother   ? Colon cancer Neg Hx   ? Breast cancer Neg Hx   ? ? ?Social History  ? ?Socioeconomic History  ? Marital status: Married  ?  Spouse name: Not on file  ? Number of children: Not on file  ? Years of education: Not on file  ? Highest education level: Not on file  ?Occupational History  ? Not on file  ?Tobacco Use  ? Smoking status: Former  ?  Types: Cigarettes  ?  Quit date: 01/2018  ?  Years since quitting: 3.9  ?  Passive exposure: Current  ? Smokeless tobacco: Former  ?  Quit date: 08/31/2017  ?Vaping Use  ? Vaping Use: Former  ? Quit date: 01/29/2018  ?Substance and Sexual Activity  ? Alcohol use: Yes  ?  Comment: occasionally  ? Drug use: Never  ? Sexual activity: Not on file  ?Other Topics Concern  ? Not on file  ?Social History Narrative  ? Not on file  ? ?Social Determinants of Health  ? ?Financial Resource Strain: Not on file  ?Food Insecurity: Not on file  ?Transportation Needs: Not on file  ?Physical Activity: Not on file  ?Stress: Not on file  ?Social Connections: Not on file  ?Intimate Partner Violence: Not on file  ? ? ? ?REVIEW OF SYSTEMS:  ? ?[X]  denotes positive finding, [ ]  denotes negative finding ?Cardiac  Comments:  ?Chest pain or chest pressure:    ?Shortness of breath upon exertion:    ?Short of breath when lying flat:    ?Irregular heart rhythm:    ?    ?Vascular    ?Pain in calf, thigh, or hip brought on by ambulation:    ?Pain in feet at night that wakes you up from your sleep:     ?Blood clot in your veins:    ?Leg swelling:     ?    ?Pulmonary    ?Oxygen at home:    ?Productive cough:     ?Wheezing:     ?    ?Neurologic     ?Sudden weakness in arms or legs:     ?Sudden numbness in arms or legs:     ?Sudden onset of difficulty speaking or slurred  speech:    ?Temporary loss of vision in one eye:     ?Problems with dizziness:     ?    ?Gastrointestinal    ?Blood in stool:     ?Vomited blood:     ?    ?Genitourinary    ?Burning when urinating:     ?Blood in urine:    ?    ?Psychiatric    ?Major depression:     ?    ?Hematologic    ?Bleeding problems:    ?Problems with blood clotting too easily:    ?    ?Skin    ?Rashes or ulcers:    ?    ?Constitutional    ?Fever or chills:    ? ? ?PHYSICAL EXAMINATION: ? ?Today's Vitals  ? 01/12/22 0904 01/12/22 0906  ?BP: 138/66 (!) 141/72  ?Pulse: (!) 54 (!) 54  ?Resp: 14   ?Temp: 97.7 ?F (36.5 ?C)   ?TempSrc: Temporal   ?SpO2: 100%   ?Weight: 147 lb (66.7 kg)   ?Height: 5\' 3"  (1.6 m)   ? ?Body mass index is 26.04 kg/m?. ? ? ?General:  WDWN in NAD; vital signs documented above ?Gait: Not observed ?HENT: WNL, normocephalic ?Pulmonary: normal non-labored breathing ?Cardiac: regular HR, without carotid bruits ?Skin: without rashes ?Vascular Exam/Pulses: ?1+ bilateral radial pulses ?-pedal pulses are not palpable ?Extremities: without ischemic changes, without Gangrene , without cellulitis; without open wounds ?Musculoskeletal: no muscle wasting or atrophy  ?Neurologic: A&O X 3; moving all extremities equally; speech is fluent/normal ?Psychiatric:  The pt has Normal affect. ? ? ?Non-Invasive Vascular Imaging:   ?Carotid Duplex on 01/12/2022 ?Right:  1-39% ICA stenosis ?Left:  60-79% ICA stenosis ?Vertebrals:  Bilateral vertebral arteries demonstrate antegrade flow.  ?Subclavians: Bilateral subclavian arteries were stenotic ? ?Previous Carotid duplex on 07/07/2021: ?Right: 1-39% ICA stenosis ?Left:   60-79% ICA stenosis ? ? ? ?ASSESSMENT/PLAN:: 61 y.o. female here for follow up carotid artery stenosis and A carotid duplex showed a 60-79% left-sided carotid stenosis.  This was followed up with a CT angiogram  of the neck that showed minimal left-sided stenosis.  After reviewing the images Dr. 41 felt that the stenosis was less than 50% and so no intervention was recommended.  ? ?-duplex today reveals the left remains wi

## 2022-01-12 ENCOUNTER — Encounter: Payer: Self-pay | Admitting: Physician Assistant

## 2022-01-12 ENCOUNTER — Ambulatory Visit (HOSPITAL_COMMUNITY)
Admission: RE | Admit: 2022-01-12 | Discharge: 2022-01-12 | Disposition: A | Discharge status: Home / Self Care | Payer: Medicaid Other | Source: Ambulatory Visit | Attending: Surgery | Admitting: Surgery

## 2022-01-12 ENCOUNTER — Ambulatory Visit (INDEPENDENT_AMBULATORY_CARE_PROVIDER_SITE_OTHER): Payer: Medicaid Other | Admitting: Physician Assistant

## 2022-01-12 VITALS — BP 141/72 | HR 54 | Temp 97.7°F | Resp 14 | Ht 63.0 in | Wt 147.0 lb

## 2022-01-12 DIAGNOSIS — I6522 Occlusion and stenosis of left carotid artery: Secondary | ICD-10-CM | POA: Diagnosis present

## 2022-01-12 DIAGNOSIS — I6523 Occlusion and stenosis of bilateral carotid arteries: Secondary | ICD-10-CM | POA: Diagnosis not present

## 2022-01-20 ENCOUNTER — Other Ambulatory Visit: Payer: Self-pay | Admitting: *Deleted

## 2022-01-20 DIAGNOSIS — I6523 Occlusion and stenosis of bilateral carotid arteries: Secondary | ICD-10-CM

## 2022-03-29 ENCOUNTER — Other Ambulatory Visit: Payer: Self-pay | Admitting: Cardiology

## 2022-04-01 ENCOUNTER — Other Ambulatory Visit: Payer: Self-pay

## 2022-04-02 ENCOUNTER — Encounter: Payer: Self-pay | Admitting: Cardiology

## 2022-04-02 ENCOUNTER — Ambulatory Visit (INDEPENDENT_AMBULATORY_CARE_PROVIDER_SITE_OTHER): Payer: Medicaid Other | Admitting: Cardiology

## 2022-04-02 VITALS — BP 136/70 | HR 82 | Ht 63.0 in | Wt 144.8 lb

## 2022-04-02 DIAGNOSIS — E7841 Elevated Lipoprotein(a): Secondary | ICD-10-CM

## 2022-04-02 DIAGNOSIS — I6522 Occlusion and stenosis of left carotid artery: Secondary | ICD-10-CM

## 2022-04-02 DIAGNOSIS — I25118 Atherosclerotic heart disease of native coronary artery with other forms of angina pectoris: Secondary | ICD-10-CM

## 2022-04-02 DIAGNOSIS — E7800 Pure hypercholesterolemia, unspecified: Secondary | ICD-10-CM

## 2022-04-02 NOTE — Patient Instructions (Signed)
Medication Instructions:  Your physician has recommended you make the following change in your medication:   STOP: Aspirin 81 mg daily  *If you need a refill on your cardiac medications before your next appointment, please call your pharmacy*   Lab Work: Your physician recommends that you return for lab work in:   Labs today:CMP, Lipid, LPa  If you have labs (blood work) drawn today and your tests are completely normal, you will receive your results only by: MyChart Message (if you have MyChart) OR A paper copy in the mail If you have any lab test that is abnormal or we need to change your treatment, we will call you to review the results.   Testing/Procedures: None   Follow-Up: At [facility], you and your health needs are our priority.  As part of our continuing mission to provide you with exceptional heart care, we have created designated Provider Care Teams.  These Care Teams include your primary Cardiologist (physician) and Advanced Practice Providers (APPs -  Physician Assistants and Nurse Practitioners) who all work together to provide you with the care you need, when you need it.  We recommend signing up for the patient portal called "MyChart".  Sign up information is provided on this After Visit Summary.  MyChart is used to connect with patients for Virtual Visits (Telemedicine).  Patients are able to view lab/test results, encounter notes, upcoming appointments, etc.  Non-urgent messages can be sent to your provider as well.   To learn more about what you can do with MyChart, go to [email or web address].    Your next appointment:   9 month(s)  The format for your next appointment:   In Person  Provider:   Norman Herrlich, MD    Other Instructions None  Important Information About Sugar

## 2022-04-02 NOTE — Progress Notes (Signed)
Cardiology Office Note:    Date:  04/02/2022   ID:  Suni, Jarnagin Dec 24, 1960, MRN [id number]  PCP:  Lonie Peak, PA-C  Cardiologist:  Norman Herrlich, MD    Referring MD: Lonie Peak, PA-C    ASSESSMENT:    1. Coronary artery disease of native artery of native heart with stable angina pectoris (HCC)   2. Hypercholesteremia   3. Elevated lipoprotein(a)   4. Carotid stenosis, left    PLAN:    In order of problems listed above:  Stable chronic CAD having no angina after PCI and stent more than a year remote drop aspirin continue single agent clopidogrel along with a rate limiting calcium channel blocker and nonstatin lipid-lowering therapy Repatha. Continue current treatment when available new novel treatments would be appropriate with her elevated LP(a).  Her family has been screened and her brother has the same problem and is being treated Stable vascular disease followed by vascular surgery in [facility]   Next appointment: 9 months   Medication Adjustments/Labs and Tests Ordered: Current medicines are reviewed at length with the patient today.  Concerns regarding medicines are outlined above.  No orders of the defined types were placed in this encounter.  No orders of the defined types were placed in this encounter.   Chief Complaint  Patient presents with   Follow-up   Coronary Artery Disease   Hyperlipidemia    History of Present Illness:    Alyssa Ritter is a 61 y.o. female with a hx of coronary artery disease with history of PCI and stent ostial proximal right coronary artery and residual 40% left main coronary stenosis 09/06/2020 familial hyperlipidemia and hyper LPA statin intolerance and peripheral arterial disease with previous right common iliac artery stent 10/03/2020 last seen 10/03/2021.  Her initial LP(a) was severely elevated 364 subsequent 150 and lipid panel has an LDL of 64 on current treatment.  Compliance with diet, lifestyle  and medications: Yes  She continues to do well has had no angina edema shortness of breath chest pain palpitation or syncope She tolerates her PCSK9 inhibitor without side effects.  She has had a good response both LDL and LP(a). He is more than 1 year remote from PCI [place] to drop aspirin and continue clopidogrel antiplatelet monotherapy She follows with vascular [facility] in [facility] Carotid duplex was performed 01/12/2022 with 1 to 39% stenosis on the right 60 to 79% stenosis on the left ICA Past Medical History:  Diagnosis Date   Abnormal cardiac CT angiography 09/06/2020   Anxiety disorder    Benign essential tremor    CAD (coronary artery disease)    Carotid artery occlusion    Constipation    Coronary artery disease involving native coronary artery of native heart with angina pectoris (HCC)    Depression with anxiety    Hematuria    Hypercholesteremia    Peripheral vascular disease of extremity (HCC)    Progressive angina (HCC) 09/06/2020   TIA (transient ischemic attack) 11/08/2020   Weight loss, abnormal     Past Surgical History:  Procedure Laterality Date   CESAREAN SECTION     CORONARY STENT INTERVENTION N/A 09/06/2020   Procedure: CORONARY STENT INTERVENTION;  Surgeon: Marykay Lex, MD;  Location: [facility] INVASIVE CV LAB;  Service: Cardiovascular;  Laterality: N/A;   LEFT HEART CATH AND CORONARY ANGIOGRAPHY N/A 09/06/2020   Procedure: LEFT HEART CATH AND CORONARY ANGIOGRAPHY;  Surgeon: Marykay Lex, MD;  Location: [facility] INVASIVE CV LAB;  Service: Cardiovascular;  Laterality: N/A;   OOPHORECTOMY Bilateral    Right cyst   ROTATOR CUFF REPAIR Left 2006    Current Medications: Current Meds  Medication Sig   aspirin EC 81 MG tablet Take 81 mg by mouth at bedtime. Swallow whole.   clonazePAM (KLONOPIN) 1 MG tablet Take 0.5-1 mg by mouth 2 (two) times daily as needed for anxiety.   clopidogrel (PLAVIX) 75 MG tablet Take 1 tablet (75 mg total) by mouth daily.   diltiazem  (CARDIZEM CD) 180 MG 24 hr capsule TAKE 1 CAPSULE(180 MG) BY MOUTH DAILY   Evolocumab 140 MG/ML SOAJ Inject 140 mg into the skin every 14 (fourteen) days.   nitroGLYCERIN (NITROSTAT) 0.4 MG SL tablet Place 1 tablet (0.4 mg total) under the tongue every 5 (five) minutes as needed for chest pain.   traZODone (DESYREL) 50 MG tablet Take 50 mg by mouth at bedtime.     Allergies:   Penicillins and Statins   Social History   Socioeconomic History   Marital status: Married    Spouse name: Not on file   Number of children: Not on file   Years of education: Not on file   Highest education level: Not on file  Occupational History   Not on file  Tobacco Use   Smoking status: Former    Types: Cigarettes    Quit date: 01/2018    Years since quitting: 4.1    Passive exposure: Current   Smokeless tobacco: Former    Quit date: 08/31/2017  Vaping Use   Vaping Use: Former   Quit date: 01/29/2018  Substance and Sexual Activity   Alcohol use: Yes    Comment: occasionally   Drug use: Never   Sexual activity: Not on file  Other Topics Concern   Not on file  Social History Narrative   Not on file   Social Determinants of Health   Financial Resource Strain: Not on file  Food Insecurity: Not on file  Transportation Needs: Not on file  Physical Activity: Not on file  Stress: Not on file  Social Connections: Not on file     Family History: The patient's family history includes Arthritis in her mother and sister; CAD in her father; Colon polyps in her mother; Hypercholesterolemia in her father and mother; Hypertension in her brother, father, and mother; Seizures in her mother; Stroke in her brother; Thyroid disease in her sister. There is no history of Colon cancer or Breast cancer. ROS:   Please see the history of present illness.    All other systems reviewed and are negative.  EKGs/Labs/Other Studies Reviewed:    The following studies were reviewed today:    Recent Labs: 10/03/2021:  ALT 7; BUN 14; Creatinine, Ser 1.02; Potassium 4.7; Sodium 141  Recent Lipid Panel    Component Value Date/Time   CHOL 137 10/03/2021 0956   TRIG 151 (H) 10/03/2021 0956   HDL 47 10/03/2021 0956   CHOLHDL 2.9 10/03/2021 0956   CHOLHDL 2.7 11/09/2020 0149   VLDL 19 11/09/2020 0149   LDLCALC 64 10/03/2021 0956    Physical Exam:    VS:  BP 136/70   Pulse 82   Ht 5\' 3"  (1.6 m)   Wt 144 lb 12.8 oz (65.7 kg)   SpO2 98%   BMI 25.65 kg/m     Wt Readings from Last 3 Encounters:  04/02/22 144 lb 12.8 oz (65.7 kg)  01/12/22 147 lb (66.7 kg)  10/03/21 142 lb (64.4 kg)  GEN:  Well nourished, well developed in no acute distress HEENT: Normal NECK: No JVD; soft systolic right carotid bruit LYMPHATICS: No lymphadenopathy CARDIAC: RRR, no murmurs, rubs, gallops RESPIRATORY:  Clear to auscultation without rales, wheezing or rhonchi  ABDOMEN: Soft, non-tender, non-distended MUSCULOSKELETAL:  No edema; No deformity  SKIN: Warm and dry NEUROLOGIC:  Alert and oriented x 3 PSYCHIATRIC:  Normal affect    Signed, Norman Herrlich, MD  04/02/2022 9:21 AM    [facility]

## 2022-04-03 LAB — COMPREHENSIVE METABOLIC PANEL
ALT: 8 IU/L (ref 0–32)
AST: 20 IU/L (ref 0–40)
Albumin/Globulin Ratio: 1.8 (ref 1.2–2.2)
Albumin: 4.6 g/dL (ref 3.8–4.9)
Alkaline Phosphatase: 112 IU/L (ref 44–121)
BUN/Creatinine Ratio: 20 (ref 9–23)
BUN: 17 mg/dL (ref 6–24)
Bilirubin Total: 0.2 mg/dL (ref 0.0–1.2)
CO2: 24 mmol/L (ref 20–29)
Calcium: 9.8 mg/dL (ref 8.7–10.2)
Chloride: 102 mmol/L (ref 96–106)
Creatinine, Ser: 0.87 mg/dL (ref 0.57–1.00)
Globulin, Total: 2.6 g/dL (ref 1.5–4.5)
Glucose: 97 mg/dL (ref 70–99)
Potassium: 5.1 mmol/L (ref 3.5–5.2)
Sodium: 140 mmol/L (ref 134–144)
Total Protein: 7.2 g/dL (ref 6.0–8.5)
eGFR: 77 mL/min/{1.73_m2} (ref >59)

## 2022-04-03 LAB — LIPID PANEL
Chol/HDL Ratio: 3.1 ratio (ref 0.0–4.4)
Cholesterol, Total: 150 mg/dL (ref 100–199)
HDL: 49 mg/dL (ref >39)
LDL Chol Calc (NIH): 67 mg/dL (ref 0–99)
Triglycerides: 207 mg/dL — ABNORMAL HIGH (ref 0–149)
VLDL Cholesterol Cal: 34 mg/dL (ref 5–40)

## 2022-04-03 LAB — LIPOPROTEIN A (LPA): Lipoprotein (a): 218.1 nmol/L — ABNORMAL HIGH (ref <75.0)

## 2022-07-06 ENCOUNTER — Encounter: Payer: Self-pay | Admitting: *Deleted

## 2022-07-06 DIAGNOSIS — Z006 Encounter for examination for normal comparison and control in clinical research program: Secondary | ICD-10-CM

## 2022-07-06 NOTE — Progress Notes (Signed)
Called patient to discuss [name]. Patient is going to discuss with her husband and I will call back tomorrow.

## 2022-07-07 NOTE — Research (Signed)
Alyssa Ritter is scheduled tomorrow for her screening visit 11/8

## 2022-07-08 ENCOUNTER — Encounter: Payer: Medicaid Other | Admitting: *Deleted

## 2022-07-08 VITALS — BP 137/61 | HR 66 | Temp 98.3°F | Ht 63.0 in | Wt 140.8 lb

## 2022-07-08 DIAGNOSIS — Z006 Encounter for examination for normal comparison and control in clinical research program: Secondary | ICD-10-CM

## 2022-07-08 NOTE — Research (Addendum)
AMGEN Ocean(a)Informed Consent   Subject Name: Alyssa Ritter  Subject met inclusion and exclusion criteria.  The informed consent form, study requirements and expectations were reviewed with the subject and questions and concerns were addressed prior to the signing of the consent form.  The subject verbalized understanding of the trial requirements.  The subject agreed to participate in the [facility]) trial and signed the informed consent at 09:10am on 07/08/2022.  The informed consent was obtained prior to performance of any protocol-specific procedures for the subject.  A copy of the signed informed consent was given to the subject and a copy was placed in the subject's medical record.   [name]  Amgem Consent Version 5 Protocol Version 2   [place] [employer]  [id number]      SITE [id number] SUBJECT ID: [id number]       DATE:  07/08/2022        _0  FEMALE                            _1  FEMALE AGE:61 ETHINICITY:   _2  HISPANIC/LATINO      _3  NON- HISPANIC/LATINO RACE:           _4   WHITE             _5  BLACK/AFRICAN AMERICAN                         _6   ASIAN              _7  AMERICAN INDIAN/[place] NATIVE                         _8   NATIVE [place]/OTHER PACIFIC ISLANDER                         _9   OTHER  FUTURE RESEARCH _10  USE OF SAMPLES FOR FUTURE RESEARCH _11  PHARMACOGENTIC (GENETIC) [place] Screening Visit [phone] SUBJECT ID:  [id number]       DATE:  07/08/2022                                 The following was completed during visit: _12  CONSENT SIGNED _13  INCLUSION/EXCLUSION MET _14  MEASUREMENTS TAKEN HEIGHT: 5'3"                  WEIGHT:140.8lb WAIST CIRMCUMFERENCE:   38in            HIP CIRCUMFERENCE:40in B/P:   137/61                         HR:66 _15  SERUM PREGNANCY TEST/FSH-NA _16  FASTING GLUCOSE/HbA1c _17  FASTING LIPID PANELY _18  LPa _19  CHEMISTRY/HEMATOLOGY _20  MEDICATIONS REVIEWED _21  DEMOGRAPHICS  Patient was seen  today in the research clinic for the Screening Visit for [name]. Consent reviewed with patient, all questions answered. Patient signed consent and is in agreement to proceed with the screening visit for the trial. All concomitant medications reviewed and no changes noted at this time. Denies any recent adverse events or hospitalizations. All procedures completed per protocol and patient tolerated well without any complaints. Explained to patient that I will call once all lab work has been completed to see if they still qualify before making an appointment for randomization. Patient verbalized understanding.   Current  Outpatient Medications:    aspirin EC 81 MG tablet, Take 81 mg by mouth daily. Swallow whole., Disp: , Rfl:    clonazePAM (KLONOPIN) 1 MG tablet, Take 0.5-1 mg by mouth 2 (two) times daily as needed for anxiety., Disp: , Rfl:    clopidogrel (PLAVIX) 75 MG tablet, Take 1 tablet (75 mg total) by mouth daily., Disp: 90 tablet, Rfl: 1   diltiazem (CARDIZEM CD) 180 MG 24 hr capsule, TAKE 1 CAPSULE(180 MG) BY MOUTH DAILY, Disp: 90 capsule, Rfl: 3   Evolocumab 140 MG/ML SOAJ, Inject 140 mg into the skin every 14 (fourteen) days., Disp: 2 mL, Rfl: 12   nitroGLYCERIN (NITROSTAT) 0.4 MG SL tablet, Place 1 tablet (0.4 mg total) under the tongue every 5 (five) minutes as needed for chest pain., Disp: 30 tablet, Rfl: 0   traZODone (DESYREL) 50 MG tablet, Take 50 mg by mouth at bedtime., Disp: , Rfl:

## 2022-07-13 NOTE — Research (Signed)
[name] screening lab results:                Are there any labs that are clinically significant?  Yes []  OR No[] 

## 2022-07-18 ENCOUNTER — Other Ambulatory Visit: Payer: Self-pay | Admitting: Cardiology

## 2022-07-20 NOTE — Telephone Encounter (Signed)
Refills to pharmacy 

## 2022-07-22 NOTE — Research (Signed)
[name] Lp(a) results:   

## 2022-08-04 ENCOUNTER — Encounter: Payer: Medicaid Other | Admitting: *Deleted

## 2022-08-04 VITALS — BP 128/56 | HR 55 | Temp 98.3°F | Ht 63.0 in | Wt 140.8 lb

## 2022-08-04 DIAGNOSIS — Z006 Encounter for examination for normal comparison and control in clinical research program: Secondary | ICD-10-CM

## 2022-08-04 MED ORDER — STUDY - OCEAN(A) - OLPASIRAN (AMG 890) 142 MG/ML OR PLACEBO SQ INJECTION (PI-HILTY)
142.0000 mg | PREFILLED_SYRINGE | Freq: Once | SUBCUTANEOUS | Status: AC
  Administered 2022-08-04: 142 mg via SUBCUTANEOUS
  Filled 2022-08-04: qty 1

## 2022-08-04 NOTE — Research (Addendum)
[name] Randomization Day 1 visit:  Patient seen in clinic today for day 1/ randomization visit of [name]. Patient denies any adverse events since last visit. Reviewed medications with patient and no changes noted at this visit. All procedures and  Lab work completed per protocol and patient tolerated well without complaints. Seen by sub-PI. IP injection was given at 10:17am in the Left arm without any complications noted.  Subject returned for PK blood draw at 11:30am. No complications noted. Patient was monitored for 30 minutes after IP injection. Next visit Week 4 scheduled for 09/03/2022.                     AMGEN [id number] SITE #: [id number] SUBJECT ID#:                     ELIGIBLITY CRITERIA WORKSHEET                             INCLUSION CRITERIA PROVIDED INFORMED CONSENT [x]   AGE 59 TO ? 85 [x]   Lp(a) ? 200 nmol/L during screening by central lab [x]   MI (presumed type 1 event due to plaque rupture       AND/OR []   PCI stenting AND one of the following: [x]   AGE ? 65 []   Residual coronary artery stenosis > 50% in any major vessel [x]   Multivessel PCI []   Symptomatic peripheral arterial disease with either (a) intermittent claudication with ankle-brachial index (ABI) < 0.85, or (b) peripheral arterial revascularization or amputation due to atherosclerotic disease []   Ischemic stroke (presumed atherosclerotic in origin) []   DM    HbA1c:  _________________ []                        EXCLUSION CRITERIA           N/A                          [x]  Severe renal function eGFR < 61mL/min/1.73 m []   Active liver disease, known hepatitis, or any hepatic dysfunction, defined as AST or ALT > 3 x upper limit of normal, or total bilirubin > 2 x ULN during screening []   History of hemorrhagic stroke []   History of major bleeding disorder []   Major cardiovascular event within 4 weeks prior to Lp(a) screening or during screening []   Planned cardiac or arterial revascularization []   Malignancy within  the last 5 years prior to Study day 1 (except non-melanoma skin cancers, cervical and breast ductal carcinoma in situ, or stage 1 prostate) []   Severe Heart Failure Class IV, and/or left EF < 30% []   Uncontrolled or recurrent Ventricular tachycardia in the last 3 months []   A fib/flutter not on therapeutic anticoagulation or having placement of left atrial appendage closure device []   Uncontrolled HTN at screening, systolic > or diastolic > []   Fasting triglycerides > 400mg /dL during screening []   HbA1c > 10% []   Know major active infection []   Current or planned lipoprotein apheresis or < 3 months since last apheresis []   Previously received RNA therapy specifically targeting Lp(a) []    Loney Hering [id number]      SITE [id number] SUBJECT QI:[id number]         DATE:  08/04/2022        []  FEMALE                            [  x] FEMALE AGE:66 ETHINICITY:   _0  HISPANIC/LATINO      _1  NON- HISPANIC/LATINO RACE:           _2   WHITE             _3  BLACK/AFRICAN AMERICAN                         _4   ASIAN              _5  AMERICAN INDIAN/[place] NATIVE                         _6   NATIVE [place]/OTHER [place] ISLANDER                         _7   OTHER  FUTURE RESEARCH _8  USE OF SAMPLES FOR FUTURE RESEARCH _9  PHARMACOGENTIC (GENETIC) RESEARCH  [name] IP ADMINISTRATION Subject FU#[id number] IP Box #:[id number] IP SEG#:[id number] Time Given:10:17am Administration site: L arm

## 2022-08-04 NOTE — Progress Notes (Signed)
Very delightful 61 year old patient of Dr. Norman Herrlich.  She is doing well.  She had a positive calcium score.  She then had coronary CTA, and subsequent stenting of the RCA.  She is statin intolerant and on Repatha.  LPa has been consistently elevated and most recent is 200 which is qualifying.  She is asymptomatic today.  She questions if she might have a UTI, and is planning to stop by primary care or Urgent care to get that checked.  She says she chronic UTIs.  Carotid dopplers are abnormal.   Alert, oriented female in NAD. P    BP     R                            T       No JVD. Lungs clear. Cor regular without murmur Abd -soft.  No obvious masses.  Ext no edema. Pulses palpable.  ECG  - NSR with sinus arrhythmia.  Borderline low voltage QRS, otherwise normal tracing.   Impression:  CAD Elevated LPa and hyperlipidemia Statin intolerance on Repatha Peripheral vascular disease Possible UTI (She has often and is getting this checked today)  Plan  I have discussed the Ocean a trial in detail with the patient,  including IP and process of randomization,  with an overview of Lpa, current trials, and expected benefits and risks.  She would like to proceed to randomization.   Arturo Morton. Riley Kill MD. [facility] Medical Director, [facility]

## 2022-08-12 NOTE — Research (Cosign Needed)
[name] Randomization day 1 lab results:               Are there any labs that are clinically significant?  Yes []  OR No[x]   , MD, Chrystie Nose  [place]  [facility]  Medical Director of the Advanced Lipid Disorders &  Cardiovascular Risk Reduction Clinic Diplomate of the American Board of Clinical Lipidology Attending Cardiologist  Direct Dial: [phone]  Fax: [phone]  Website:  [email or web address]

## 2022-09-03 ENCOUNTER — Encounter: Payer: Medicaid Other | Admitting: *Deleted

## 2022-09-03 DIAGNOSIS — Z006 Encounter for examination for normal comparison and control in clinical research program: Secondary | ICD-10-CM

## 2022-09-03 NOTE — Research (Signed)
[name] week 4 visit:   Patient seen in clinic today for week 4 visit of [name]. Patient denies any adverse events since last visit. Reviewed medications with patient and no changes noted at this visit. Labs were drawn per protocol and patient tolerated well without complaints. Next visit Week 12 scheduled for  11/02/2022.  No IP due at this visit.    Current Outpatient Medications:    aspirin EC 81 MG tablet, Take 81 mg by mouth daily. Swallow whole., Disp: , Rfl:    clonazePAM (KLONOPIN) 1 MG tablet, Take 0.5-1 mg by mouth 2 (two) times daily as needed for anxiety., Disp: , Rfl:    clopidogrel (PLAVIX) 75 MG tablet, Take 1 tablet (75 mg total) by mouth daily., Disp: 90 tablet, Rfl: 1   diltiazem (CARDIZEM CD) 180 MG 24 hr capsule, TAKE 1 CAPSULE(180 MG) BY MOUTH DAILY, Disp: 90 capsule, Rfl: 3   nitroGLYCERIN (NITROSTAT) 0.4 MG SL tablet, Place 1 tablet (0.4 mg total) under the tongue every 5 (five) minutes as needed for chest pain., Disp: 30 tablet, Rfl: 0   REPATHA SURECLICK 915 MG/ML SOAJ, ADMINISTER 1 ML UNDER THE SKIN EVERY 14 DAYS, Disp: 2 mL, Rfl: 12   Study - [name] - olpasiran (AMG 890) 142 mg/mL or placebo SQ injection (PI-Hilty), Inject 142 mg into the skin once. For Investigational Use Only. Inject 1 mL (1 prefilled syringe) subcutaneously into appropriate injection site per protocol every 12 weeks. (Approved injection site(s): upper arm, upper thigh & abdomen). Please contact [facility] for any questions or concerns regarding this medication., Disp: , Rfl:    traZODone (DESYREL) 50 MG tablet, Take 50 mg by mouth at bedtime., Disp: , Rfl:

## 2022-09-07 NOTE — Research (Addendum)
[name] week 4 labs:          Are there any labs that are clinically significant?  Yes []  OR No[x]   Pixie Casino, MD, FACC, [place] Director of the Advanced Lipid Disorders &  Cardiovascular Risk Reduction Clinic Diplomate of the American Board of Clinical Lipidology Attending Cardiologist  Direct Dial: [phone]  Fax: [phone]  Website:  [email or web address]

## 2022-09-18 ENCOUNTER — Other Ambulatory Visit: Payer: Self-pay

## 2022-09-18 DIAGNOSIS — I2510 Atherosclerotic heart disease of native coronary artery without angina pectoris: Secondary | ICD-10-CM

## 2022-09-18 DIAGNOSIS — R5381 Other malaise: Secondary | ICD-10-CM

## 2022-09-18 DIAGNOSIS — E7800 Pure hypercholesterolemia, unspecified: Secondary | ICD-10-CM

## 2022-09-18 MED ORDER — DILTIAZEM HCL ER COATED BEADS 180 MG PO CP24: ORAL_CAPSULE | ORAL | 2 refills | Status: DC

## 2022-09-28 IMAGING — CT CT ANGIO NECK
2 of 7 series · 8 of 33 positions shown · IV contrast (OMNI 350)
Comparison: Head MRI and MRA 11/08/2020

CLINICAL DATA: Stroke.

EXAM:
CT ANGIOGRAPHY HEAD AND NECK
TECHNIQUE: Multidetector CT imaging of the head and neck was performed using
the standard protocol during bolus administration of intravenous
contrast. Multiplanar CT image reconstructions and MIPs were
obtained to evaluate the vascular anatomy. Carotid stenosis
measurements (when applicable) are obtained utilizing NASCET
criteria, using the distal internal carotid diameter as the
denominator.
CONTRAST:  75mL OMNIPAQUE IOHEXOL 350 MG/ML SOLN

[Series 6: cta neck · axial · 0.46mm/px · z∈[-186,-68]mm · 2 of 177 slices shown]
[im 59/177  soft-tissue]
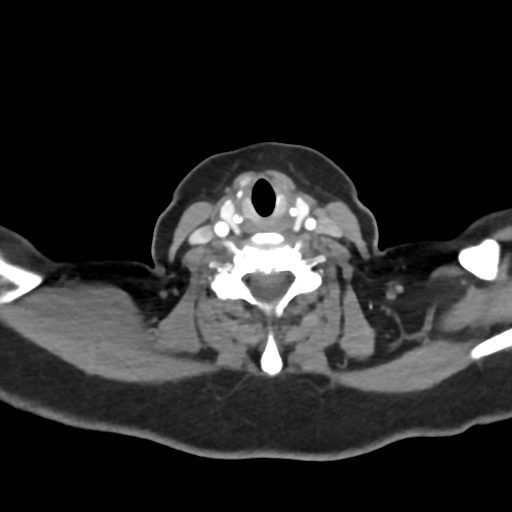
[im 118/177  soft-tissue]
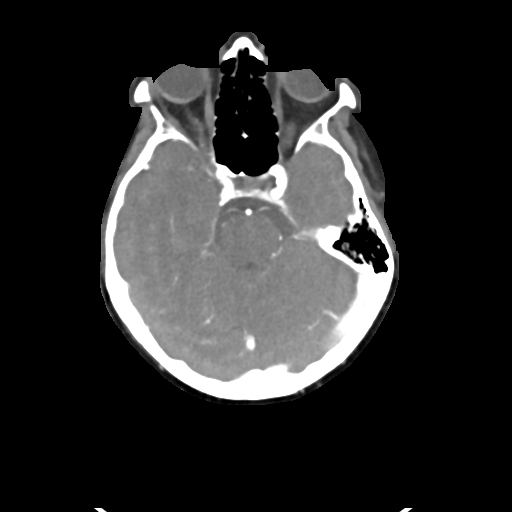

[Series 8: cta neck axial · axial · 0.45mm/px · z∈[-257,-8]mm · 6 of 352 slices shown]
[im 51/352  soft-tissue]
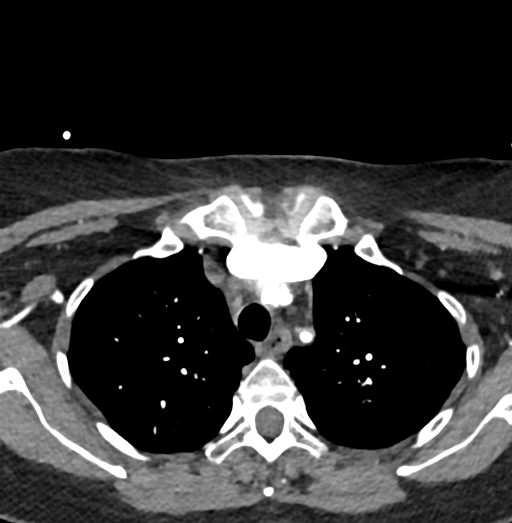
[im 101/352  bone]
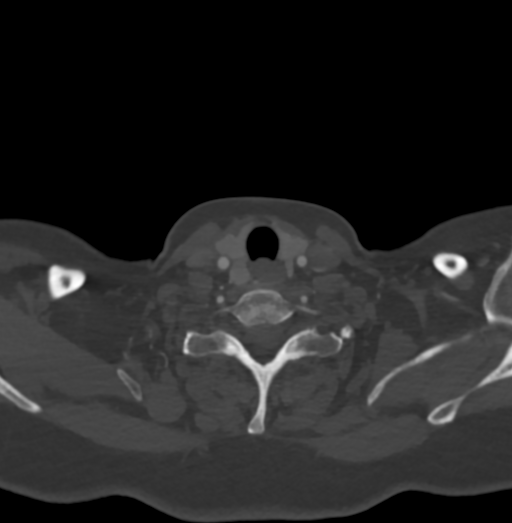
[im 151/352  soft-tissue]
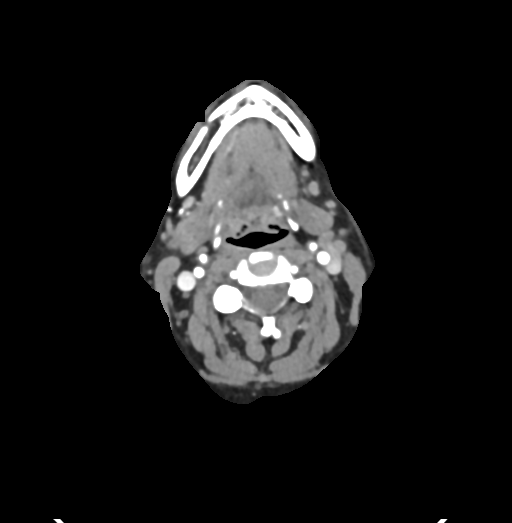
[im 201/352  bone]
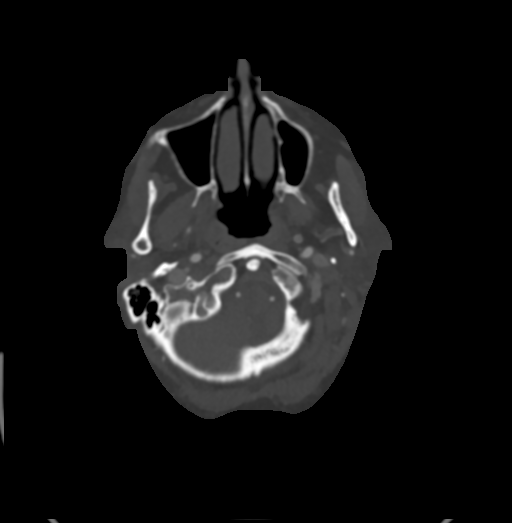
[im 251/352  soft-tissue]
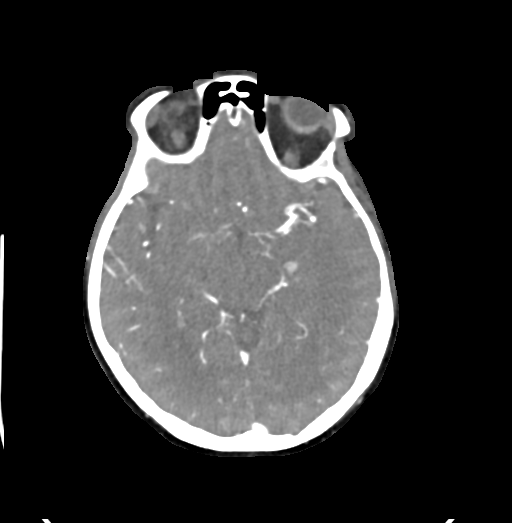
[im 301/352  bone]
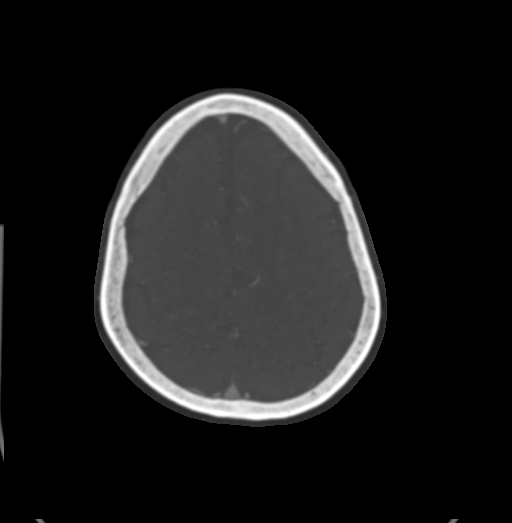

[8 of 33 positions shown; findings below may reference images not displayed]

FINDINGS: CT HEAD FINDINGS

Brain: There is no evidence of an acute infarct, intracranial
hemorrhage, mass, midline shift, or extra-axial fluid collection.
The ventricles and sulci are within normal limits for age. A dilated
perivascular space is again noted inferiorly in the left basal
ganglia.

Vascular: Calcified atherosclerosis at the skull base.

Skull: No fracture or suspicious osseous lesion.

Sinuses: Visualized paranasal sinuses and mastoid air cells are
clear.

Orbits: Unremarkable.

Review of the MIP images confirms the above findings

CTA NECK FINDINGS

Aortic arch: Normal variant aortic arch branching pattern with
common origin of the brachiocephalic and left common carotid
arteries. Mild atherosclerosis without significant arch vessel
origin stenosis.

Right carotid system: Patent with mild calcified plaque at the
carotid bifurcation. No evidence of significant stenosis or
dissection.

Left carotid system: Patent with calcified plaque at the carotid
bifurcation resulting in less than 50% stenosis of the distal common
carotid artery. No ICA stenosis or evidence of dissection.

Vertebral arteries: Patent and codominant without evidence of
stenosis or dissection.

Skeleton: Mild cervical facet arthrosis.

Other neck: No evidence of cervical lymphadenopathy or mass.

Upper chest: No apical lung consolidation or mass.

Review of the MIP images confirms the above findings

CTA HEAD FINDINGS

Anterior circulation: The internal carotid arteries are patent from
skull base to carotid termini with mild atherosclerotic plaque on
the left not resulting in significant stenosis. There is a 1.5 mm
outpouching from the left supraclinoid ICA in the posterior
communicating region without a visible vessel arising from it. ACAs
and MCAs are patent without evidence of a proximal branch occlusion
or significant proximal stenosis.

Posterior circulation: The intracranial vertebral arteries are
widely patent to the basilar. Patent PICA and SCA origins are seen
bilaterally. The basilar artery is widely patent. There is a large
right posterior communicating artery with hypoplastic right P1
segment. Both PCAs are patent without evidence of a significant
proximal stenosis. No aneurysm is identified.

Venous sinuses: Patent.

Anatomic variants: Predominantly fetal origin of the right PCA.

Review of the MIP images confirms the above findings
IMPRESSION: 1. Mild atherosclerosis in the head and neck without large vessel
occlusion or significant proximal stenosis.
2. 1.5 mm left supraclinoid ICA infundibulum versus aneurysm.
3. Aortic Atherosclerosis (AKZD6-729.9).

## 2022-09-28 IMAGING — CT CT ANGIO HEAD
3 of 4 series · 11 of 33 positions shown · IV contrast (omnipaque)
Comparison: Head MRI and MRA 11/08/2020

CLINICAL DATA: Stroke.

EXAM:
CT ANGIOGRAPHY HEAD AND NECK
TECHNIQUE: Multidetector CT imaging of the head and neck was performed using
the standard protocol during bolus administration of intravenous
contrast. Multiplanar CT image reconstructions and MIPs were
obtained to evaluate the vascular anatomy. Carotid stenosis
measurements (when applicable) are obtained utilizing NASCET
criteria, using the distal internal carotid diameter as the
denominator.
CONTRAST:  75mL OMNIPAQUE IOHEXOL 350 MG/ML SOLN

[Series 3: head without · axial · non-contrast · 0.39mm/px · z∈[-39,+11]mm · 2 of 32 slices shown]
[im 11/32  soft-tissue]
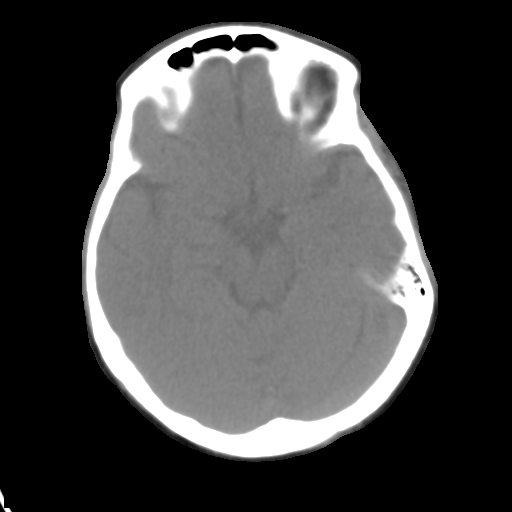
[im 21/32  bone]
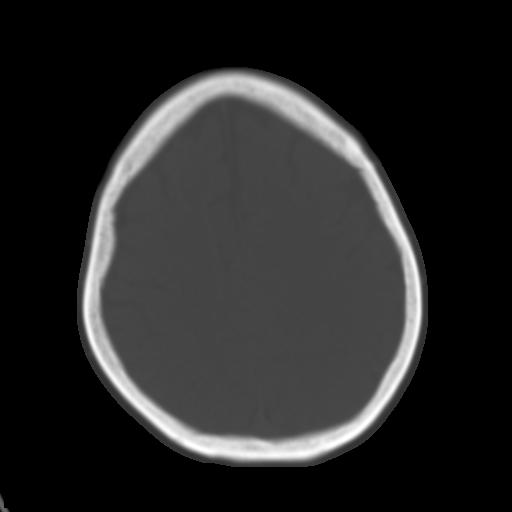

[Series 4: head bone · axial · 0.39mm/px · z∈[-73,+49]mm · 8 of 79 slices shown]
[im 9/79  bone]
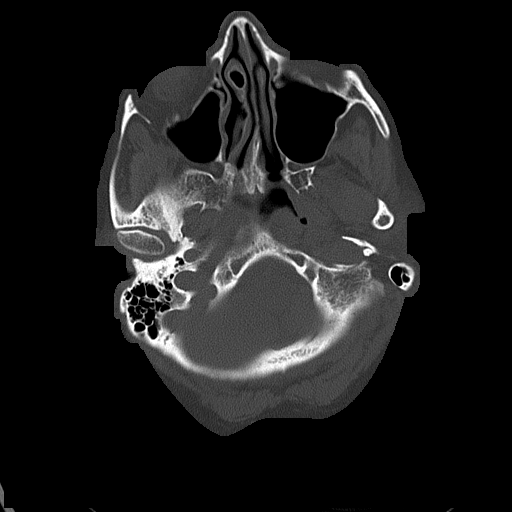
[im 18/79  bone]
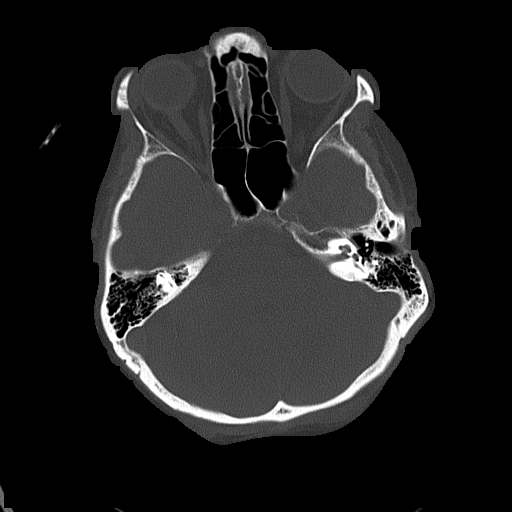
[im 27/79  bone]
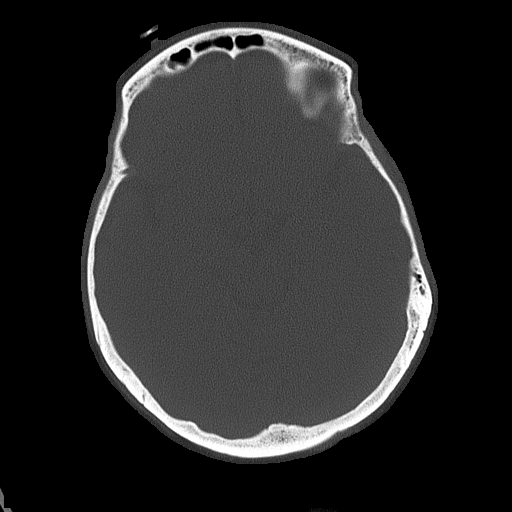
[im 35/79  bone]
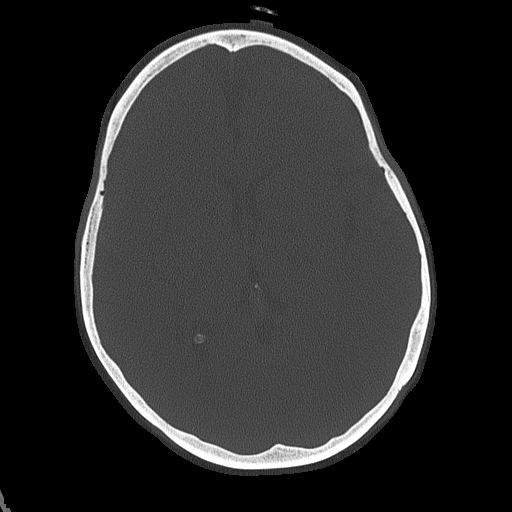
[im 44/79  bone]
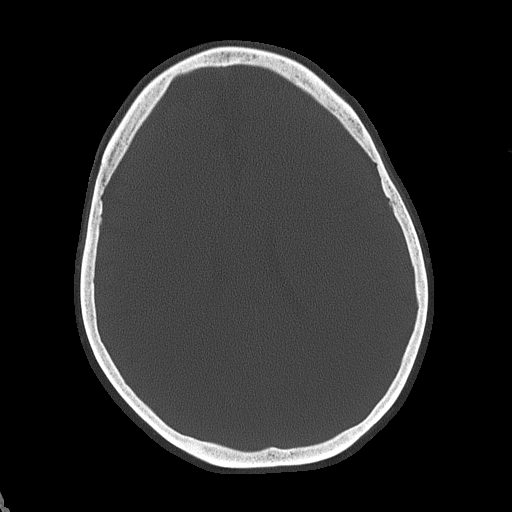
[im 53/79  bone]
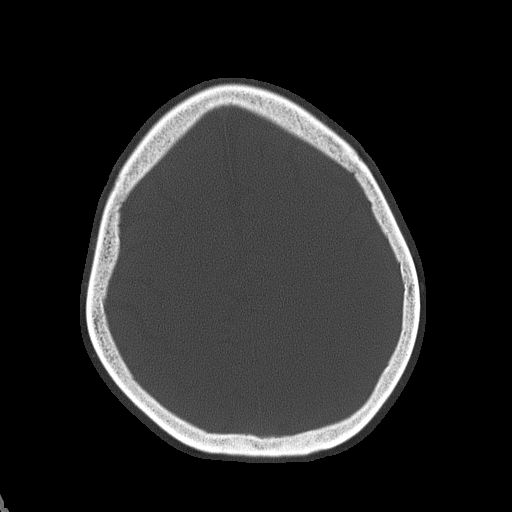
[im 61/79  bone]
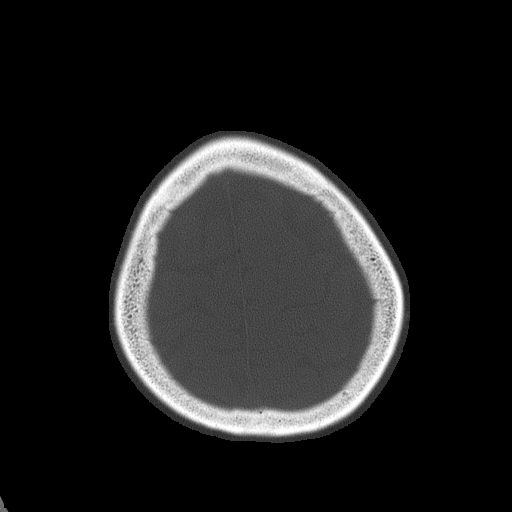
[im 70/79  bone]
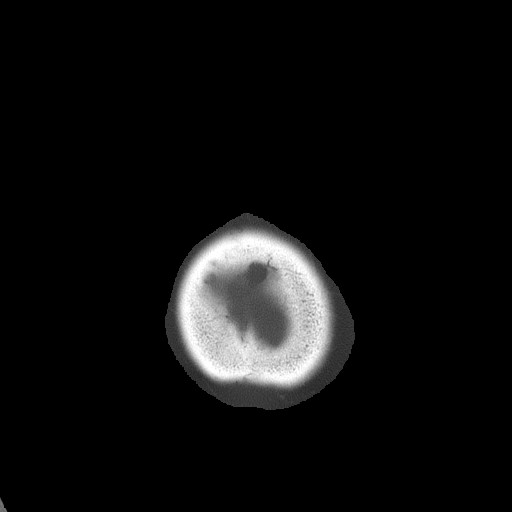

[Series 6: sagittal · sagittal · 0.31mm/px · 1 of 65 slices shown]
[im 33/65  soft-tissue]
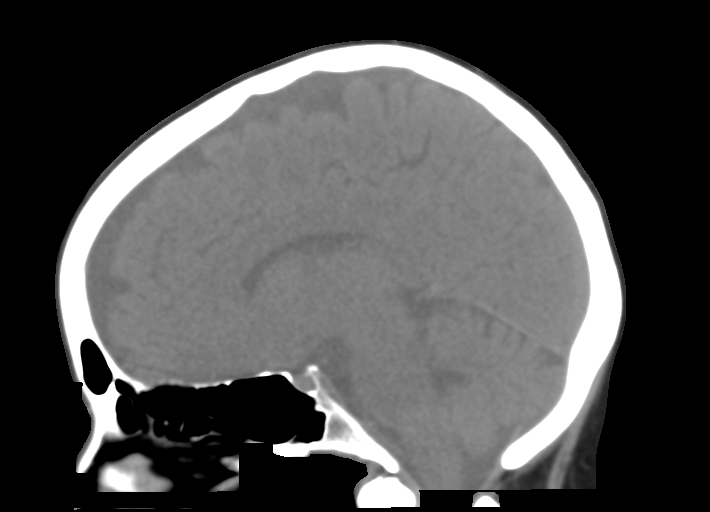

[11 of 33 positions shown; findings below may reference images not displayed]

FINDINGS: CT HEAD FINDINGS

Brain: There is no evidence of an acute infarct, intracranial
hemorrhage, mass, midline shift, or extra-axial fluid collection.
The ventricles and sulci are within normal limits for age. A dilated
perivascular space is again noted inferiorly in the left basal
ganglia.

Vascular: Calcified atherosclerosis at the skull base.

Skull: No fracture or suspicious osseous lesion.

Sinuses: Visualized paranasal sinuses and mastoid air cells are
clear.

Orbits: Unremarkable.

Review of the MIP images confirms the above findings

CTA NECK FINDINGS

Aortic arch: Normal variant aortic arch branching pattern with
common origin of the brachiocephalic and left common carotid
arteries. Mild atherosclerosis without significant arch vessel
origin stenosis.

Right carotid system: Patent with mild calcified plaque at the
carotid bifurcation. No evidence of significant stenosis or
dissection.

Left carotid system: Patent with calcified plaque at the carotid
bifurcation resulting in less than 50% stenosis of the distal common
carotid artery. No ICA stenosis or evidence of dissection.

Vertebral arteries: Patent and codominant without evidence of
stenosis or dissection.

Skeleton: Mild cervical facet arthrosis.

Other neck: No evidence of cervical lymphadenopathy or mass.

Upper chest: No apical lung consolidation or mass.

Review of the MIP images confirms the above findings

CTA HEAD FINDINGS

Anterior circulation: The internal carotid arteries are patent from
skull base to carotid termini with mild atherosclerotic plaque on
the left not resulting in significant stenosis. There is a 1.5 mm
outpouching from the left supraclinoid ICA in the posterior
communicating region without a visible vessel arising from it. ACAs
and MCAs are patent without evidence of a proximal branch occlusion
or significant proximal stenosis.

Posterior circulation: The intracranial vertebral arteries are
widely patent to the basilar. Patent PICA and SCA origins are seen
bilaterally. The basilar artery is widely patent. There is a large
right posterior communicating artery with hypoplastic right P1
segment. Both PCAs are patent without evidence of a significant
proximal stenosis. No aneurysm is identified.

Venous sinuses: Patent.

Anatomic variants: Predominantly fetal origin of the right PCA.

Review of the MIP images confirms the above findings
IMPRESSION: 1. Mild atherosclerosis in the head and neck without large vessel
occlusion or significant proximal stenosis.
2. 1.5 mm left supraclinoid ICA infundibulum versus aneurysm.
3. Aortic Atherosclerosis (AKZD6-729.9).

## 2022-10-18 ENCOUNTER — Other Ambulatory Visit: Payer: Self-pay | Admitting: Cardiology

## 2022-10-19 NOTE — Telephone Encounter (Signed)
Clopidogrel 75 mg # 90 x 1 refill sent to  [facility]

## 2022-11-02 ENCOUNTER — Encounter: Payer: Medicaid Other | Admitting: *Deleted

## 2022-11-02 VITALS — BP 137/71 | HR 75 | Temp 98.1°F

## 2022-11-02 DIAGNOSIS — Z006 Encounter for examination for normal comparison and control in clinical research program: Secondary | ICD-10-CM

## 2022-11-02 MED ORDER — STUDY - OCEAN(A) - OLPASIRAN (AMG 890) 142 MG/ML OR PLACEBO SQ INJECTION (PI-HILTY)
142.0000 mg | PREFILLED_SYRINGE | Freq: Once | SUBCUTANEOUS | Status: AC
  Administered 2022-11-02: 142 mg via SUBCUTANEOUS
  Filled 2022-11-02: qty 1

## 2022-11-02 NOTE — Research (Addendum)
[name] week 12:  Patient seen in clinic today for week  12 visit of [name]. Patient denies any adverse events since last visit. Reviewed medications with patient and no changes noted at this visit. All procedures and  Lab work completed per protocol and patient tolerated well without complaints. IP injection was given at 09:47 in the L arm without any complications noted.  Next visit Week 24 scheduled for 02/02/2023.  Patient was monitored for 30 minutes after IP injection.  PK drawn at 11:04.     [name] IP ADMINISTRATION Subject UJ#[id number] IP Box #:[id number] IP ION#:[id number] Time Given:9 ;47 Administration site:  L arm          Current Outpatient Medications:    aspirin EC 81 MG tablet, Take 81 mg by mouth daily. Swallow whole., Disp: , Rfl:    clonazePAM (KLONOPIN) 1 MG tablet, Take 0.5-1 mg by mouth 2 (two) times daily as needed for anxiety., Disp: , Rfl:    clopidogrel (PLAVIX) 75 MG tablet, TAKE 1 TABLET(75 MG) BY MOUTH DAILY, Disp: 90 tablet, Rfl: 1   diltiazem (CARDIZEM CD) 180 MG 24 hr capsule, TAKE 1 CAPSULE(180 MG) BY MOUTH DAILY, Disp: 90 capsule, Rfl: 2   nitroGLYCERIN (NITROSTAT) 0.4 MG SL tablet, Place 1 tablet (0.4 mg total) under the tongue every 5 (five) minutes as needed for chest pain., Disp: 30 tablet, Rfl: 0   REPATHA SURECLICK 140 MG/ML SOAJ, ADMINISTER 1 ML UNDER THE SKIN EVERY 14 DAYS, Disp: 2 mL, Rfl: 12   Study - [name] - olpasiran (AMG 890) 142 mg/mL or placebo SQ injection (PI-Hilty), Inject 142 mg into the skin once. For Investigational Use Only. Inject 1 mL (1 prefilled syringe) subcutaneously into appropriate injection site per protocol every 12 weeks. (Approved injection site(s): upper arm, upper thigh & abdomen). Please contact [facility] for any questions or concerns regarding this medication., Disp: , Rfl:    traZODone (DESYREL) 50 MG tablet, Take 50 mg by mouth at bedtime., Disp: , Rfl:   Current Facility-Administered  Medications:    Study - [name] - olpasiran (AMG 890) 142 mg/mL or placebo SQ injection (PI-Hilty), 142 mg, Subcutaneous, Once, Hilty, Lisette Abu, MD

## 2022-11-09 NOTE — Research (Addendum)
[name] week 12 lab results:        Are there any labs that are clinically significant?  Yes [] OR No[x]  Kenneth C. Hilty, MD, FACC, FACP  [place]  [facility]  Medical Director of the Advanced Lipid Disorders &  Cardiovascular Risk Reduction Clinic Diplomate of the American Board of Clinical Lipidology Attending Cardiologist  Direct Dial: [phone]  Fax: [phone]  Website:  [email or web address]  

## 2022-12-01 ENCOUNTER — Ambulatory Visit (INDEPENDENT_AMBULATORY_CARE_PROVIDER_SITE_OTHER): Payer: Medicaid Other | Admitting: Physician Assistant

## 2022-12-01 ENCOUNTER — Ambulatory Visit (HOSPITAL_COMMUNITY)
Admission: RE | Admit: 2022-12-01 | Discharge: 2022-12-01 | Disposition: A | Discharge status: Home / Self Care | Payer: Medicaid Other | Source: Ambulatory Visit | Attending: Vascular Surgery | Admitting: Vascular Surgery

## 2022-12-01 VITALS — BP 138/78 | HR 94 | Temp 98.0°F | Resp 20 | Ht 63.0 in | Wt 140.3 lb

## 2022-12-01 DIAGNOSIS — I6523 Occlusion and stenosis of bilateral carotid arteries: Secondary | ICD-10-CM | POA: Diagnosis not present

## 2022-12-01 DIAGNOSIS — I6522 Occlusion and stenosis of left carotid artery: Secondary | ICD-10-CM | POA: Diagnosis not present

## 2022-12-01 NOTE — Progress Notes (Signed)
Office Note     CC:  follow up Requesting Provider:  Cyndi Bender, PA-C  HPI: Alyssa Ritter is a 62 y.o. (Apr 20, 1961) female who presents for surveillance of artery stenosis.  She had an episode of right leg numbness and weakness in March 2022.  Workup was negative for stroke.  Carotid duplex demonstrated left-sided 60 to 79% stenosis however CTA of the neck showed only minimal stenosis less than 50% thus no intervention was undertaken.  She has been seen every 6 months since that time.  She has not had any recurrent symptoms.  Carotid stenosis has been estimated to be between 60 and 79% stenosis by duplex.  She is taking her aspirin and Plavix daily.  She is on Repatha due to a statin allergy.  She is a former smoker.   Past Medical History:  Diagnosis Date   Abnormal cardiac CT angiography 09/06/2020   Anxiety disorder    Benign essential tremor    CAD (coronary artery disease)    Carotid artery occlusion    Constipation    Coronary artery disease involving native coronary artery of native heart with angina pectoris    Depression with anxiety    Hematuria    Hypercholesteremia    Peripheral vascular disease of extremity    Progressive angina 09/06/2020   TIA (transient ischemic attack) 11/08/2020   Weight loss, abnormal     Past Surgical History:  Procedure Laterality Date   CESAREAN SECTION     CORONARY STENT INTERVENTION N/A 09/06/2020   Procedure: CORONARY STENT INTERVENTION;  Surgeon: Leonie Man, MD;  Location: [facility];  Service: Cardiovascular;  Laterality: N/A;   LEFT HEART CATH AND CORONARY ANGIOGRAPHY N/A 09/06/2020   Procedure: LEFT HEART CATH AND CORONARY ANGIOGRAPHY;  Surgeon: Leonie Man, MD;  Location: [facility];  Service: Cardiovascular;  Laterality: N/A;   OOPHORECTOMY Bilateral    Right cyst   ROTATOR CUFF REPAIR Left 2006    Social History   Socioeconomic History   Marital status: Married    Spouse name: Not on file    Number of children: Not on file   Years of education: Not on file   Highest education level: Not on file  Occupational History   Not on file  Tobacco Use   Smoking status: Former    Types: Cigarettes    Quit date: 01/2018    Years since quitting: 4.8    Passive exposure: Current   Smokeless tobacco: Former    Quit date: 08/31/2017  Vaping Use   Vaping Use: Former   Quit date: 01/29/2018  Substance and Sexual Activity   Alcohol use: Yes    Comment: occasionally   Drug use: Never   Sexual activity: Not on file  Other Topics Concern   Not on file  Social History Narrative   Not on file   Social Determinants of Health   Financial Resource Strain: Not on file  Food Insecurity: Not on file  Transportation Needs: Not on file  Physical Activity: Not on file  Stress: Not on file  Social Connections: Not on file  Intimate Partner Violence: Not on file    Family History  Problem Relation Age of Onset   Colon polyps Mother    Hypertension Mother    Hypercholesterolemia Mother    Arthritis Mother    Seizures Mother    Hypertension Father    CAD Father        died of complications  of AML   Hypercholesterolemia Father    Thyroid disease Sister    Arthritis Sister    Hypertension Brother    Stroke Brother    Colon cancer Neg Hx    Breast cancer Neg Hx     Current Outpatient Medications  Medication Sig Dispense Refill   aspirin EC 81 MG tablet Take 81 mg by mouth daily. Swallow whole.     clonazePAM (KLONOPIN) 1 MG tablet Take 0.5-1 mg by mouth 2 (two) times daily as needed for anxiety.     clopidogrel (PLAVIX) 75 MG tablet TAKE 1 TABLET(75 MG) BY MOUTH DAILY 90 tablet 1   diltiazem (CARDIZEM CD) 180 MG 24 hr capsule TAKE 1 CAPSULE(180 MG) BY MOUTH DAILY 90 capsule 2   nitroGLYCERIN (NITROSTAT) 0.4 MG SL tablet Place 1 tablet (0.4 mg total) under the tongue every 5 (five) minutes as needed for chest pain. 30 tablet 0   REPATHA SURECLICK [id number] MG/ML SOAJ ADMINISTER 1 ML UNDER  THE SKIN EVERY 14 DAYS 2 mL 12   Study - [name] - olpasiran (AMG 890) 142 mg/mL or placebo SQ injection (PI-Hilty) Inject 142 mg into the skin once. For Investigational Use Only. Inject 1 mL (1 prefilled syringe) subcutaneously into appropriate injection site per protocol every 12 weeks. (Approved injection site(s): upper arm, upper thigh & abdomen). Please contact [facility] for any questions or concerns regarding this medication.     traZODone (DESYREL) 50 MG tablet Take 50 mg by mouth at bedtime.     No current facility-administered medications for this visit.    Allergies  Allergen Reactions   Penicillins Anaphylaxis   Statins     Kidney Injury     REVIEW OF SYSTEMS:   [X]  denotes positive finding, [ ]  denotes negative finding Cardiac  Comments:  Chest pain or chest pressure:    Shortness of breath upon exertion:    Short of breath when lying flat:    Irregular heart rhythm:        Vascular    Pain in calf, thigh, or hip brought on by ambulation:    Pain in feet at night that wakes you up from your sleep:     Blood clot in your veins:    Leg swelling:         Pulmonary    Oxygen at home:    Productive cough:     Wheezing:         Neurologic    Sudden weakness in arms or legs:     Sudden numbness in arms or legs:     Sudden onset of difficulty speaking or slurred speech:    Temporary loss of vision in one eye:     Problems with dizziness:         Gastrointestinal    Blood in stool:     Vomited blood:         Genitourinary    Burning when urinating:     Blood in urine:        Psychiatric    Major depression:         Hematologic    Bleeding problems:    Problems with blood clotting too easily:        Skin    Rashes or ulcers:        Constitutional    Fever or chills:      PHYSICAL EXAMINATION:  Vitals:   12/01/22 1006 12/01/22 1008  BP: 138/76 138/78  Pulse:  94   Resp: 20   Temp: 98 F (36.7 C)   TempSrc: Temporal   SpO2:  98%   Weight: 140 lb 4.8 oz (63.6 kg)   Height: 5\' 3"  (1.6 m)     General:  WDWN in NAD; vital signs documented above Gait: Not observed HENT: WNL, normocephalic Pulmonary: normal non-labored breathing , without Rales, rhonchi,  wheezing Cardiac: regular HR Abdomen: soft, NT, no masses Skin: without rashes Vascular Exam/Pulses: symmetrical radial pulses Extremities: without ischemic changes, without Gangrene , without cellulitis; without open wounds;  Musculoskeletal: no muscle wasting or atrophy  Neurologic: A&O X 3; CN grossly intact Psychiatric:  The pt has Normal affect.   Non-Invasive Vascular Imaging:   R ICA 1-39% stenosis L ICA 60-79% stenosis    ASSESSMENT/PLAN:: 62 y.o. female here for follow up for surveillance of carotid artery stenosis  -Patient has not had any right-sided weakness, slurring speech, or changes in vision since initial episode in March 2022 -Carotid duplex is unchanged demonstrating right ICA stenosis 1 to 39% and left ICA stenosis 60 to 79%; no indication for revascularization of asymptomatic left ICA stenosis at this time -Continue aspirin and Plavix daily -Recheck carotid duplex in 6 months   Dagoberto Ligas, PA-C [facility] [phone]  Clinic MD:   Carlis Abbott

## 2022-12-08 ENCOUNTER — Other Ambulatory Visit: Payer: Self-pay

## 2022-12-08 DIAGNOSIS — I6523 Occlusion and stenosis of bilateral carotid arteries: Secondary | ICD-10-CM

## 2022-12-17 ENCOUNTER — Other Ambulatory Visit (HOSPITAL_COMMUNITY): Payer: Self-pay | Admitting: Interventional Radiology

## 2022-12-17 DIAGNOSIS — I720 Aneurysm of carotid artery: Secondary | ICD-10-CM

## 2022-12-21 ENCOUNTER — Ambulatory Visit (HOSPITAL_COMMUNITY)
Admission: RE | Admit: 2022-12-21 | Discharge: 2022-12-21 | Disposition: A | Discharge status: Home / Self Care | Payer: Medicaid Other | Source: Ambulatory Visit | Attending: Interventional Radiology | Admitting: Interventional Radiology

## 2022-12-21 DIAGNOSIS — I720 Aneurysm of carotid artery: Secondary | ICD-10-CM

## 2022-12-22 HISTORY — PX: IR RADIOLOGIST EVAL & MGMT: IMG5224

## 2022-12-30 ENCOUNTER — Other Ambulatory Visit (HOSPITAL_COMMUNITY): Payer: Self-pay | Admitting: Interventional Radiology

## 2022-12-30 DIAGNOSIS — I720 Aneurysm of carotid artery: Secondary | ICD-10-CM

## 2023-01-07 ENCOUNTER — Other Ambulatory Visit: Payer: Self-pay | Admitting: Student

## 2023-01-07 ENCOUNTER — Other Ambulatory Visit: Payer: Self-pay | Admitting: Radiology

## 2023-01-07 DIAGNOSIS — I720 Aneurysm of carotid artery: Secondary | ICD-10-CM

## 2023-01-08 ENCOUNTER — Ambulatory Visit (HOSPITAL_COMMUNITY)
Admission: RE | Admit: 2023-01-08 | Discharge: 2023-01-08 | Disposition: A | Discharge status: Home / Self Care | Payer: Medicaid Other | Source: Ambulatory Visit | Attending: Interventional Radiology | Admitting: Interventional Radiology

## 2023-01-08 ENCOUNTER — Other Ambulatory Visit (HOSPITAL_COMMUNITY): Payer: Self-pay | Admitting: Interventional Radiology

## 2023-01-08 ENCOUNTER — Encounter (HOSPITAL_COMMUNITY): Payer: Self-pay

## 2023-01-08 DIAGNOSIS — I720 Aneurysm of carotid artery: Secondary | ICD-10-CM

## 2023-01-08 DIAGNOSIS — Z8249 Family history of ischemic heart disease and other diseases of the circulatory system: Secondary | ICD-10-CM | POA: Insufficient documentation

## 2023-01-08 DIAGNOSIS — Z87891 Personal history of nicotine dependence: Secondary | ICD-10-CM | POA: Insufficient documentation

## 2023-01-08 DIAGNOSIS — Z823 Family history of stroke: Secondary | ICD-10-CM | POA: Diagnosis not present

## 2023-01-08 DIAGNOSIS — Z7982 Long term (current) use of aspirin: Secondary | ICD-10-CM | POA: Diagnosis not present

## 2023-01-08 DIAGNOSIS — Z7902 Long term (current) use of antithrombotics/antiplatelets: Secondary | ICD-10-CM | POA: Insufficient documentation

## 2023-01-08 DIAGNOSIS — I2510 Atherosclerotic heart disease of native coronary artery without angina pectoris: Secondary | ICD-10-CM | POA: Insufficient documentation

## 2023-01-08 DIAGNOSIS — I739 Peripheral vascular disease, unspecified: Secondary | ICD-10-CM | POA: Insufficient documentation

## 2023-01-08 DIAGNOSIS — Z8673 Personal history of transient ischemic attack (TIA), and cerebral infarction without residual deficits: Secondary | ICD-10-CM | POA: Diagnosis not present

## 2023-01-08 DIAGNOSIS — Z955 Presence of coronary angioplasty implant and graft: Secondary | ICD-10-CM | POA: Insufficient documentation

## 2023-01-08 HISTORY — PX: IR ANGIO VERTEBRAL SEL VERTEBRAL BILAT MOD SED: IMG5369

## 2023-01-08 HISTORY — PX: IR ANGIO INTRA EXTRACRAN SEL COM CAROTID INNOMINATE BILAT MOD SED: IMG5360

## 2023-01-08 LAB — CBC
HCT: 46.3 % — ABNORMAL HIGH (ref 36.0–46.0)
Hemoglobin: 15.1 g/dL — ABNORMAL HIGH (ref 12.0–15.0)
MCH: 28.4 pg (ref 26.0–34.0)
MCHC: 32.6 g/dL (ref 30.0–36.0)
MCV: 87 fL (ref 80.0–100.0)
Platelets: 305 10*3/uL (ref 150–400)
RBC: 5.32 MIL/uL — ABNORMAL HIGH (ref 3.87–5.11)
RDW: 13.5 % (ref 11.5–15.5)
WBC: 8.6 10*3/uL (ref 4.0–10.5)
nRBC: 0 % (ref 0.0–0.2)

## 2023-01-08 LAB — BASIC METABOLIC PANEL
Anion gap: 10 (ref 5–15)
BUN: 12 mg/dL (ref 6–20)
CO2: 22 mmol/L (ref 22–32)
Calcium: 8.7 mg/dL — ABNORMAL LOW (ref 8.9–10.3)
Chloride: 106 mmol/L (ref 98–111)
Creatinine, Ser: 0.99 mg/dL (ref 0.44–1.00)
GFR, Estimated: >60 mL/min (ref >60)
Glucose, Bld: 95 mg/dL (ref 70–99)
Potassium: 4.2 mmol/L (ref 3.5–5.1)
Sodium: 138 mmol/L (ref 135–145)

## 2023-01-08 LAB — PROTIME-INR
INR: 1 (ref 0.8–1.2)
Prothrombin Time: 13.5 seconds (ref 11.4–15.2)

## 2023-01-08 LAB — POCT I-STAT CREATININE: Creatinine, Ser: 1 mg/dL (ref 0.44–1.00)

## 2023-01-08 MED ORDER — MIDAZOLAM HCL 2 MG/2ML IJ SOLN
INTRAMUSCULAR | Status: AC
  Filled 2023-01-08: qty 2

## 2023-01-08 MED ORDER — FENTANYL CITRATE (PF) 100 MCG/2ML IJ SOLN
INTRAMUSCULAR | Status: AC
  Filled 2023-01-08: qty 2

## 2023-01-08 MED ORDER — HEPARIN SODIUM (PORCINE) 1000 UNIT/ML IJ SOLN
INTRAMUSCULAR | Status: AC
  Filled 2023-01-08: qty 10

## 2023-01-08 MED ORDER — NITROGLYCERIN 1 MG/10 ML FOR IR/CATH LAB
INTRA_ARTERIAL | Status: AC
  Filled 2023-01-08: qty 10

## 2023-01-08 MED ORDER — HEPARIN SODIUM (PORCINE) 1000 UNIT/ML IJ SOLN
INTRAMUSCULAR | Status: AC | PRN
  Administered 2023-01-08: 1000 [IU] via INTRAVENOUS

## 2023-01-08 MED ORDER — LIDOCAINE HCL 1 % IJ SOLN
INTRAMUSCULAR | Status: AC
  Filled 2023-01-08: qty 20

## 2023-01-08 MED ORDER — MIDAZOLAM HCL 2 MG/2ML IJ SOLN
INTRAMUSCULAR | Status: AC | PRN
  Administered 2023-01-08: 1 mg via INTRAVENOUS

## 2023-01-08 MED ORDER — IOHEXOL 300 MG/ML  SOLN
150.0000 mL | Freq: Once | INTRAMUSCULAR | Status: AC | PRN
  Administered 2023-01-08: 96 mL via INTRA_ARTERIAL

## 2023-01-08 MED ORDER — FENTANYL CITRATE (PF) 100 MCG/2ML IJ SOLN
INTRAMUSCULAR | Status: AC | PRN
  Administered 2023-01-08: 25 ug via INTRAVENOUS

## 2023-01-08 MED ORDER — LIDOCAINE HCL (PF) 1 % IJ SOLN: 5.0000 mL | Freq: Once | INTRAMUSCULAR | Status: DC

## 2023-01-08 MED ORDER — VERAPAMIL HCL 2.5 MG/ML IV SOLN
INTRAVENOUS | Status: AC
  Filled 2023-01-08: qty 2

## 2023-01-08 MED ORDER — SODIUM CHLORIDE 0.9 % IV SOLN: INTRAVENOUS | Status: DC

## 2023-01-08 MED ORDER — SODIUM CHLORIDE 0.9 % IV SOLN: INTRAVENOUS | Status: AC

## 2023-01-08 NOTE — H&P (Signed)
Chief Complaint: Patient was seen in consultation today for carotid stenosis  Referring Physician(s): Emilie Rutter, PA-C  Supervising Physician: Julieanne Cotton  Patient Status: [facility] - Out-pt  History of Present Illness: Alyssa Ritter is a 62 y.o. female with past medical history of anxiety, CAD s/p PCI, PVD s/p iliac stent, prior TIA who was recently seen in consultation with Dr. Corliss Skains for carotid artery stenosis. At the time of consult, recommendation was made for diagnostic angiogram given her recent left retro-orbital supraorbital headaches.  She presents for procedure today in her usual state of health.  Denies headache today.  She is accompanied by her husband. She has an extensive history of arterial disease in her legs and is s/p iliac stent >20 years ago. She has also had a cardiac cath via R wrist without issue. She is understanding of the goals of the procedure today and is agreeable to proceed.   She has been NPO.  She does take Plavix and aspirin.  Does not take blood thinners.   Her husband is providing post-procedure care and transporation today.   Past Medical History:  Diagnosis Date   Abnormal cardiac CT angiography 09/06/2020   Anxiety disorder    Benign essential tremor    CAD (coronary artery disease)    Carotid artery occlusion    Constipation    Coronary artery disease involving native coronary artery of native heart with angina pectoris (HCC)    Depression with anxiety    Hematuria    Hypercholesteremia    Peripheral vascular disease of extremity (HCC)    Progressive angina (HCC) 09/06/2020   TIA (transient ischemic attack) 11/08/2020   Weight loss, abnormal     Past Surgical History:  Procedure Laterality Date   CESAREAN SECTION     CORONARY STENT INTERVENTION N/A 09/06/2020   Procedure: CORONARY STENT INTERVENTION;  Surgeon: Marykay Lex, MD;  Location: [facility];  Service: Cardiovascular;  Laterality: N/A;   IR  RADIOLOGIST EVAL & MGMT  12/22/2022   LEFT HEART CATH AND CORONARY ANGIOGRAPHY N/A 09/06/2020   Procedure: LEFT HEART CATH AND CORONARY ANGIOGRAPHY;  Surgeon: Marykay Lex, MD;  Location: [facility];  Service: Cardiovascular;  Laterality: N/A;   OOPHORECTOMY Bilateral    Right cyst   ROTATOR CUFF REPAIR Left 2006    Allergies: Penicillins and Statins  Medications: Prior to Admission medications   Medication Sig Start Date End Date Taking? Authorizing Provider  aspirin EC 81 MG tablet Take 81 mg by mouth daily. Swallow whole.   Yes [provider]  clonazePAM (KLONOPIN) 1 MG tablet Take 0.5-1 mg by mouth 2 (two) times daily as needed for anxiety. 04/25/20  Yes [provider]  clopidogrel (PLAVIX) 75 MG tablet TAKE 1 TABLET(75 MG) BY MOUTH DAILY 10/19/22  Yes Baldo Daub, MD  diltiazem (CARDIZEM CD) 180 MG 24 hr capsule TAKE 1 CAPSULE(180 MG) BY MOUTH DAILY 09/18/22  Yes Baldo Daub, MD  REPATHA SURECLICK 140 MG/ML SOAJ ADMINISTER 1 ML UNDER THE SKIN EVERY 14 DAYS 07/20/22  Yes Baldo Daub, MD  traZODone (DESYREL) 50 MG tablet Take 50 mg by mouth at bedtime. 02/19/20  Yes [provider]  nitroGLYCERIN (NITROSTAT) 0.4 MG SL tablet Place 1 tablet (0.4 mg total) under the tongue every 5 (five) minutes as needed for chest pain. 09/02/20   Baldo Daub, MD  Study - [name] - olpasiran (AMG 890) 142 mg/mL or placebo SQ injection (PI-Hilty) Inject  142 mg into the skin once. For Investigational Use Only. Inject 1 mL (1 prefilled syringe) subcutaneously into appropriate injection site per protocol every 12 weeks. (Approved injection site(s): upper arm, upper thigh & abdomen). Please contact [facility] for any questions or concerns regarding this medication. 08/04/22   Herby Abraham, MD  carvedilol (COREG) 3.125 MG tablet Take 1 tablet (3.125 mg total) by mouth 2 (two) times daily with a meal. 09/06/20 10/03/20  Arty Baumgartner, NP      Family History  Problem Relation Age of Onset   Colon polyps Mother    Hypertension Mother    Hypercholesterolemia Mother    Arthritis Mother    Seizures Mother    Hypertension Father    CAD Father        died of complications of AML   Hypercholesterolemia Father    Thyroid disease Sister    Arthritis Sister    Hypertension Brother    Stroke Brother    Colon cancer Neg Hx    Breast cancer Neg Hx     Social History   Socioeconomic History   Marital status: Married    Spouse name: Not on file   Number of children: Not on file   Years of education: Not on file   Highest education level: Not on file  Occupational History   Not on file  Tobacco Use   Smoking status: Former    Types: Cigarettes    Quit date: 01/2018    Years since quitting: 4.9    Passive exposure: Current   Smokeless tobacco: Former    Quit date: 08/31/2017  Vaping Use   Vaping Use: Former   Quit date: 01/29/2018  Substance and Sexual Activity   Alcohol use: Yes    Comment: occasionally   Drug use: Never   Sexual activity: Not on file  Other Topics Concern   Not on file  Social History Narrative   Not on file   Social Determinants of Health   Financial Resource Strain: Not on file  Food Insecurity: Not on file  Transportation Needs: Not on file  Physical Activity: Not on file  Stress: Not on file  Social Connections: Not on file     Review of Systems: A 12 point ROS discussed and pertinent positives are indicated in the HPI above.  All other systems are negative.  Review of Systems  Constitutional:  Negative for fatigue and fever.  Respiratory:  Negative for cough and shortness of breath.   Cardiovascular:  Negative for chest pain.  Gastrointestinal:  Negative for abdominal pain, nausea and vomiting.  Musculoskeletal:  Negative for back pain.  Psychiatric/Behavioral:  Negative for behavioral problems and confusion.     Vital Signs: BP 139/66   Pulse 61   Temp 97.7 F (36.5 C)  (Temporal)   Resp 18   Ht 5\' 3"  (1.6 m)   Wt 140 lb (63.5 kg)   SpO2 98%   BMI 24.80 kg/m   Physical Exam Vitals and nursing note reviewed.  Constitutional:      General: She is not in acute distress.    Appearance: Normal appearance. She is not ill-appearing.  HENT:     Mouth/Throat:     Mouth: Mucous membranes are moist.     Pharynx: Oropharynx is clear.  Cardiovascular:     Rate and Rhythm: Normal rate and regular rhythm.  Pulmonary:     Effort: Pulmonary effort is normal.     Breath sounds:  Normal breath sounds.  Abdominal:     General: Abdomen is flat.     Palpations: Abdomen is soft.  Skin:    General: Skin is warm and dry.  Neurological:     General: No focal deficit present.     Mental Status: She is alert and oriented to person, place, and time. Mental status is at baseline.  Psychiatric:        Mood and Affect: Mood normal.        Behavior: Behavior normal.        Thought Content: Thought content normal.        Judgment: Judgment normal.      MD Evaluation Airway: WNL Heart: WNL Abdomen: WNL Chest/ Lungs: WNL ASA  Classification: 3 Mallampati/Airway Score: Two   Imaging: IR Radiologist Eval & Mgmt  Result Date: 12/22/2022 EXAM: NEW PATIENT OFFICE VISIT CHIEF COMPLAINT: Management of history of left internal carotid artery intracranial aneurysm. Current Pain Level: 1-10 HISTORY OF PRESENT ILLNESS: Comprehensive 62 year old right handed lady, referred by her primary provider for evaluation and management of a previously discovered left internal carotid artery intracranial aneurysm. History was obtained from the patient, and also from review of her medical electronic record, and notes provided by her referring primary provider. Diagnosis * : Date . * : Abnormal cardiac CT angiography * : 09/06/2020 . * : Anxiety disorder * : . * : Benign essential tremor * : . * : CAD (coronary artery disease) * : . * : Carotid artery occlusion * : . * : Constipation * : . * :  Coronary artery disease involving native coronary artery of native heart with angina pectoris * : . * : Depression with anxiety * : . * : Hematuria * : . * : Hypercholesteremia * : . * : Peripheral vascular disease of extremity * : . * : Progressive angina * : 09/06/2020 . * : TIA (transient ischemic attack) * : 11/08/2020 . * : Weight loss, abnormal * : Past Surgical History: Procedure * : Laterality * : Date . * : CESAREAN SECTION * : * : . * : CORONARY STENT INTERVENTION * : N/A * : 09/06/2020 * : Procedure: CORONARY STENT INTERVENTION; Surgeon: Marykay Lex, MD; Location: [facility]; Service: Cardiovascular; Laterality: N/A; . * : LEFT HEART CATH AND CORONARY ANGIOGRAPHY * : N/A * : 09/06/2020 * : Procedure: LEFT HEART CATH AND CORONARY ANGIOGRAPHY; Surgeon: Marykay Lex, MD; Location: [facility]; Service: Cardiovascular; Laterality: N/A; . * : OOPHORECTOMY * : Bilateral * : * : Right cyst . * : ROTATOR CUFF REPAIR * : Left * : 2006 Socioeconomic History . * : Marital status: * : Married * : * : Spouse name: * : Not on file . * : Number of children: * : Not on file . * : Years of education: * : Not on file . * : Highest education level: * : Not on file Occupational History . * : Not on file Tobacco Use . * : Smoking status: * : Former * : * : Types: * : Cigarettes * : * : Quit date: * : 01/2018 * : * : Years since quitting: * : 4.8 * : * : Passive exposure: * : Current . * : Smokeless tobacco: * : Former * : * : Quit date: * : 08/31/2017 Vaping Use . * : Vaping Use: * : Former . * : Quit date: * :  01/29/2018 Substance and Sexual Activity . * : Alcohol use: * : Yes * : * : Comment: occasionally . * : Drug use: * : Never . * : Sexual activity: * : Not on file Other Topics * : Concern . * : Not on file Social History Narrative . * : Not on file Food Insecurity: Not on file Transportation Needs: Not on file Physical Activity: Not on file Stress: Not on file Social Connections: Not on file Intimate  Partner Violence: Not on file Family History Problem * : Relation * : Age of Onset . * : Colon polyps * : Mother * : . * : Hypertension * : Mother * : . * : Hypercholesterolemia * : Mother * : . * : Arthritis * : Mother * : . * : Seizures * : Mother * : . * : Hypertension * : Father * : . * : CAD * : Father * : * :     died of complications of AML . * : Hypercholesterolemia * : Father * : . * : Thyroid disease * : Sister * : . * : Arthritis * : Sister * : . * : Hypertension * : Brother * : . * : Stroke * : Brother * : . * : Colon cancer * : Neg Hx * : . * : Breast cancer * : Neg Hx * : Current Outpatient Medications Medication * : Sig * : Dispense * : Refill . * : aspirin EC 81 MG tablet * : Take 81 mg by mouth daily. Swallow whole. * : * : . * : clonazePAM (KLONOPIN) 1 MG tablet * : Take 0.5-1 mg by mouth 2 (two) times daily as needed for anxiety. * : * : . * : clopidogrel (PLAVIX) 75 MG tablet * : TAKE 1 TABLET(75 MG) BY MOUTH DAILY * : 90 tablet * : 1 . * : diltiazem (CARDIZEM CD) 180 MG 24 hr capsule * : TAKE 1 CAPSULE(180 MG) BY MOUTH DAILY * : 90 capsule * : 2 . * : nitroGLYCERIN (NITROSTAT) 0.4 MG SL tablet * : Place 1 tablet (0.4 mg total) under the tongue every 5 (five) minutes as needed for chest pain. * : 30 tablet * : 0 . * : REPATHA SURECLICK 140 MG/ML SOAJ * : ADMINISTER 1 ML UNDER THE SKIN EVERY 14 DAYS * : 2 mL * : 12 . * : Study - [name] - olpasiran (AMG 890) 142 mg/mL or placebo SQ injection (PI-Hilty) * : Inject 142 mg into the skin once. For Investigational Use Only. Inject 1 mL (1 prefilled syringe) subcutaneously into appropriate injection site per protocol every 12 weeks. (Approved injection site(s): upper arm, upper thigh & abdomen). Please contact [facility] for any questions or concerns regarding this medication. * : * : . * : traZODone (DESYREL) 50 MG tablet * : Take 50 mg by mouth at bedtime. * : * : Allergies Allergen * : Reactions . * : Penicillins * : Anaphylaxis . *  : Statins * : * : * : Kidney Injury REVIEW OF SYSTEMS: Negative unless as mentioned above. PHYSICAL EXAMINATION: Patient is awake, alert, oriented fully. Speech and comprehension intact. Appears to be in no acute distress. Normal eye contact. No gross abnormal neurological abnormalities identified. Station and gait normal. ASSESSMENT AND PLAN: The patient's previous CT angiogram of the head and neck of November 09, 2020 revealed a small outpouching in the left ICA  supraclinoid region representing a small aneurysm. Given the patient's recent history or localized left retro-orbital supraorbital headaches that are different from her migraine headaches, and personal history of abnormal appearing left ICA outpouching, and family history of ruptured brain aneurysms in her uncle (mother's brother), a follow-up neuroimaging study is warranted. Diagnostic catheter arteriogram would be the most appropriate and accurate study to evaluate this further. The diagnostic catheter arteriogram would be via trans radial or transfemoral route as an outpatient procedure under conscious sedation. The patient would spend 3-4 hours in short-stay following the procedure before being discharged home. Small risk of ischemic stroke was reviewed with the patient. Patient expressed understanding, and would like to proceed with a diagnostic catheter arteriogram. In the meantime, patient advised to maintain adequate hydration and to continue with her present medication regimen. The procedure will be scheduled as soon as possible in the next few days. Electronically Signed   By: Julieanne Cotton M.D.   On: 12/22/2022 08:26    Labs:  CBC: Recent Labs    01/08/23 0714  WBC 8.6  HGB 15.1*  HCT 46.3*  PLT 305    COAGS: Recent Labs    01/08/23 0714  INR 1.0    BMP: Recent Labs    04/02/22 0939  NA 140  K 5.1  CL 102  CO2 24  GLUCOSE 97  BUN 17  CALCIUM 9.8  CREATININE 0.87    LIVER FUNCTION TESTS: Recent Labs     04/02/22 0939  BILITOT 0.2  AST 20  ALT 8  ALKPHOS 112  PROT 7.2  ALBUMIN 4.6    TUMOR MARKERS: No results for input(s): "AFPTM", "CEA", "CA199", "CHROMGRNA" in the last 8760 hours.  Assessment and Plan: Patient with past medical history of peripheral vascular disease, CAD presents with complaint of carotid artery stenosis.  IR consulted for diagnostic angiogram at the request of Emilie Rutter, PA-C. Case reviewed by Dr. Corliss Skains who approves patient for procedure.  Patient presents today in their usual state of health.  She has been NPO and is not currently on blood thinners.   Risks and benefits were discussed with the patient including, but not limited to bleeding, infection, vascular injury or contrast induced renal failure.  This interventional procedure involves the use of X-rays and because of the nature of the planned procedure, it is possible that we will have prolonged use of X-ray fluoroscopy.  Potential radiation risks to you include (but are not limited to) the following: - A slightly elevated risk for cancer  several years later in life. This risk is typically less than 0.5% percent. This risk is low in comparison to the normal incidence of human cancer, which is 33% for women and 50% for men according to the [employer] Cancer Society. - Radiation induced injury can include skin redness, resembling a rash, tissue breakdown / ulcers and hair loss (which can be temporary or permanent).   The likelihood of either of these occurring depends on the difficulty of the procedure and whether you are sensitive to radiation due to previous procedures, disease, or genetic conditions.   IF your procedure requires a prolonged use of radiation, you will be notified and given written instructions for further action.  It is your responsibility to monitor the irradiated area for the 2 weeks following the procedure and to notify your physician if you are concerned that you have suffered  a radiation induced injury.    All of the patient's questions were answered, patient is agreeable  to proceed.  Consent signed and in chart.  Thank you for this interesting consult.  I greatly enjoyed meeting Alyssa Ritter and look forward to participating in their care.  A copy of this report was sent to the requesting provider on this date.  Electronically Signed: Hoyt Koch, PA 01/08/2023, 8:05 AM   I spent a total of    25 Minutes in face to face in clinical consultation, greater than 50% of which was counseling/coordinating care for carotid artery stenosis.

## 2023-01-08 NOTE — Progress Notes (Signed)
Patient and husband was given discharge instructions. Both verbalized understanding. 

## 2023-01-08 NOTE — Procedures (Addendum)
Status post four-vessel cerebral arteriogram.  Right CFA approach.  Findings.  1.  Approximately 2 mm conical outpouching in the left posterior communicating artery region of the left ICA with a vessel emanating at its apex most consistent with an infundibulum.  2.  Approximately  75 to 80% stenosis of the proximal right subclavian artery.  3.  Approximately 90% stenosis of the proximal left ICA at the bulb  Fatima Sanger MD

## 2023-01-18 ENCOUNTER — Encounter: Payer: Self-pay | Admitting: Surgery

## 2023-01-18 ENCOUNTER — Ambulatory Visit (INDEPENDENT_AMBULATORY_CARE_PROVIDER_SITE_OTHER): Payer: Medicaid Other | Admitting: Surgery

## 2023-01-18 VITALS — BP 147/82 | HR 74 | Temp 97.5°F | Resp 14 | Ht 63.0 in | Wt 141.0 lb

## 2023-01-18 DIAGNOSIS — I6523 Occlusion and stenosis of bilateral carotid arteries: Secondary | ICD-10-CM

## 2023-01-18 NOTE — Progress Notes (Signed)
[facility] [place]  Patient name: Alyssa Ritter MRN: [id number] DOB: 12/26/1960 Sex: female   REASON FOR VISIT:    Follow up  HISOTRY OF PRESENT ILLNESS:   Alyssa Ritter is a 62 y.o. (Jan 11, 1961) female who presents for surveillance of artery stenosis.  She had an episode of right leg numbness and weakness in March 2022.  Workup was negative for stroke.  Carotid duplex demonstrated left-sided 60 to 79% stenosis however CTA of the neck showed only minimal stenosis less than 50% thus no intervention was undertaken.  She has been seen every 6 months since that time.  She has not had any recurrent symptoms.  Carotid stenosis has been estimated to be between 60 and 79% stenosis by duplex.  She is taking her aspirin and Plavix daily.  She is on Repatha due to a statin allergy.  She is a former smoker.   She recently underwent carotid angiography by neuroradiology which indicated a greater than 90% left carotid stenosis.  With the exception of her headache, she is asymptomatic.  She denies numbness or weakness in extremity.  She had no slurred speech.  She denies emesis fugax.  She does not endorse any upper extremity claudication.  She sees Dr. Dulce Sellar for coronary disease.  She is previously undergone stenting.   PAST MEDICAL HISTORY:   Past Medical History:  Diagnosis Date   Abnormal cardiac CT angiography 09/06/2020   Anxiety disorder    Benign essential tremor    CAD (coronary artery disease)    Carotid artery occlusion    Constipation    Coronary artery disease involving native coronary artery of native heart with angina pectoris (HCC)    Depression with anxiety    Hematuria    Hypercholesteremia    Peripheral vascular disease of extremity (HCC)    Progressive angina (HCC) 09/06/2020   TIA (transient ischemic attack) 11/08/2020   Weight loss, abnormal      FAMILY HISTORY:   Family History  Problem Relation  Age of Onset   Colon polyps Mother    Hypertension Mother    Hypercholesterolemia Mother    Arthritis Mother    Seizures Mother    Hypertension Father    CAD Father        died of complications of AML   Hypercholesterolemia Father    Thyroid disease Sister    Arthritis Sister    Hypertension Brother    Stroke Brother    Colon cancer Neg Hx    Breast cancer Neg Hx     SOCIAL HISTORY:   Social History   Tobacco Use   Smoking status: Former    Types: Cigarettes    Quit date: 01/2018    Years since quitting: 4.9    Passive exposure: Current   Smokeless tobacco: Former    Quit date: 08/31/2017  Substance Use Topics   Alcohol use: Yes    Comment: occasionally     ALLERGIES:   Allergies  Allergen Reactions   Penicillins Anaphylaxis   Statins     Kidney Injury     CURRENT MEDICATIONS:   Current Outpatient Medications  Medication Sig Dispense Refill   aspirin EC 81 MG tablet Take 81 mg by mouth daily. Swallow whole.     clonazePAM (KLONOPIN) 1 MG tablet Take 0.5-1 mg by mouth 2 (two) times daily as needed for anxiety.     clopidogrel (PLAVIX) 75 MG tablet TAKE 1 TABLET(75 MG) BY MOUTH DAILY 90 tablet  1   diltiazem (CARDIZEM CD) 180 MG 24 hr capsule TAKE 1 CAPSULE(180 MG) BY MOUTH DAILY 90 capsule 2   LINZESS 145 MCG CAPS capsule Take 145 mcg by mouth daily.     nitroGLYCERIN (NITROSTAT) 0.4 MG SL tablet Place 1 tablet (0.4 mg total) under the tongue every 5 (five) minutes as needed for chest pain. 30 tablet 0   REPATHA SURECLICK 140 MG/ML SOAJ ADMINISTER 1 ML UNDER THE SKIN EVERY 14 DAYS 2 mL 12   Study - [name] - olpasiran (AMG 890) 142 mg/mL or placebo SQ injection (PI-[facility]ilty) Inject 142 mg into the skin once. For Investigational Use Only. Inject 1 mL (1 prefilled syringe) subcutaneously into appropriate injection site per protocol every 12 weeks. (Approved injection site(s): upper arm, upper thigh & abdomen). Please contact [facility] for any  questions or concerns regarding this medication.     traZODone (DESYREL) 50 MG tablet Take 50 mg by mouth at bedtime.     No current facility-administered medications for this visit.    REVIEW OF SYSTEMS:   [X]  denotes positive finding, [ ]  denotes negative finding Cardiac  Comments:  Chest pain or chest pressure:    Shortness of breath upon exertion:    Short of breath when lying flat:    Irregular heart rhythm:        Vascular    Pain in calf, thigh, or hip brought on by ambulation:    Pain in feet at night that wakes you up from your sleep:     Blood clot in your veins:    Leg swelling:         Pulmonary    Oxygen at home:    Productive cough:     Wheezing:         Neurologic    Sudden weakness in arms or legs:     Sudden numbness in arms or legs:     Sudden onset of difficulty speaking or slurred speech:    Temporary loss of vision in one eye:     Problems with dizziness:         Gastrointestinal    Blood in stool:     Vomited blood:         Genitourinary    Burning when urinating:     Blood in urine:        Psychiatric    Major depression:         Hematologic    Bleeding problems:    Problems with blood clotting too easily:        Skin    Rashes or ulcers:        Constitutional    Fever or chills:      PHYSICAL EXAM:   Vitals:   01/18/23 1332 01/18/23 1336  BP: (!) 140/71 (!) 147/82  Pulse: 74 74  Resp: 14   Temp: (!) 97.5 F (36.4 C)   TempSrc: Temporal   SpO2: 98%   Weight: 141 lb (64 kg)   Height: 5\' 3"  (1.6 m)     GENERAL: The patient is a well-nourished female, in no acute distress. The vital signs are documented above. CARDIAC: There is a regular rate and rhythm.  PULMONARY: Non-labored respirations MUSCULOSKELETAL: There are no major deformities or cyanosis. NEUROLOGIC: No focal weakness or paresthesias are detected. SKIN: There are no ulcers or rashes noted. PSYCHIATRIC: The patient has a normal affect.  STUDIES:   I have  reviewed the following: Carotid angiogram  shows 90% stenosis of the proximal left internal carotid artery.  She has 75% stenosis of the proximal right subclavian artery  MEDICAL ISSUES:   Asymptomatic left carotid stenosis: I have reviewed all of her images including her ultrasounds, CT scans, and angiography.  Stenosis is 90% by angiography.  We discussed proceeding with left carotid endarterectomy.  I discussed the details of the operation including the risks and benefits.  We discussed the risk of stroke as well as nerve injury.  All questions were answered.  I will not stop her aspirin and Plavix.  She is scheduled to see Dr. Dulce Sellar within the next 2 weeks and so I will make sure that we have his clearance before proceeding.  Right subclavian stenosis: This is asymptomatic.  No intervention is recommended at this time    Durene Cal, IV, MD, FACS [facility] [phone] Pager [phone]

## 2023-01-19 ENCOUNTER — Telehealth: Payer: Self-pay | Admitting: *Deleted

## 2023-01-19 NOTE — Telephone Encounter (Signed)
   Pre-operative Risk Assessment    Patient Name: Alyssa Ritter  DOB: Dec 13, 1960 MRN: [id number]      Request for Surgical Clearance    Procedure:   Left CEA  Date of Surgery:  Clearance TBD/ASAP                           Surgeon:  Dr. Myra Gianotti Surgeon's Group or Practice Name:  [facility] Phone number:  [phone] Fax number:  [phone]   Type of Clearance Requested:   - Medical  - Pharmacy:  Hold Aspirin and Clopidogrel (Plavix) Not Indicated   Type of Anesthesia:  General    Additional requests/questions:    Signed, Emmit Pomfret   01/19/2023, 10:36 AM

## 2023-01-19 NOTE — Telephone Encounter (Signed)
   Name: Alyssa Ritter  DOB: 1961-06-26  MRN: [id number]  Primary Cardiologist: Norman Herrlich, MD  Chart reviewed as part of pre-operative protocol coverage. The patient has an upcoming visit scheduled with Dr. Dulce Sellar on 02/03/2023 at which time clearance can be addressed in case there are any issues that would impact surgical recommendations.  L CEA is not scheduled until TBD as below. I added preop FYI to appointment note so that provider is aware to address at time of outpatient visit.  Per office protocol the cardiology provider should forward their finalized clearance decision and recommendations regarding antiplatelet therapy to the requesting party below.    I will route this message as FYI to requesting party and remove this message from the preop box as separate preop APP input not needed at this time.   Please call with any questions.  Joylene Grapes, NP  01/19/2023, 11:53 AM

## 2023-01-28 MED ORDER — STUDY - OCEAN(A) - OLPASIRAN (AMG 890) 142 MG/ML OR PLACEBO SQ INJECTION (PI-HILTY)
142.0000 mg | PREFILLED_SYRINGE | Freq: Once | SUBCUTANEOUS | Status: AC
  Administered 2023-02-02: 142 mg via SUBCUTANEOUS
  Filled 2023-01-28: qty 1

## 2023-02-02 VITALS — BP 150/62 | HR 68 | Temp 98.1°F | Wt 142.2 lb

## 2023-02-02 DIAGNOSIS — Z006 Encounter for examination for normal comparison and control in clinical research program: Secondary | ICD-10-CM

## 2023-02-02 NOTE — Research (Addendum)
[name] week 24  Patient seen in clinic today for week  24 visit of [name]. Patient denies any adverse events since last visit. Reviewed medications with patient and no changes noted at this visit. All procedures and  Lab work completed per protocol and patient tolerated well without complaints. IP injection was given at 10:00 in the R arm without any complications noted.  Next visit Week 36 scheduled for 04/27/23.  Patient was monitored for 30 minutes after IP injection.  PK drawn at 11:00.  Reviewed lipid blinding with the patient and he/she understands.    [name] IP ADMINISTRATION Subject ZO#[id number] IP Box [id number] IP lot#:[id number] Time Given:10:00 Administration site: Right arm   Current Outpatient Medications:    aspirin EC 81 MG tablet, Take 81 mg by mouth daily. Swallow whole., Disp: , Rfl:    clonazePAM (KLONOPIN) 1 MG tablet, Take 0.5-1 mg by mouth 2 (two) times daily as needed for anxiety., Disp: , Rfl:    clopidogrel (PLAVIX) 75 MG tablet, TAKE 1 TABLET(75 MG) BY MOUTH DAILY, Disp: 90 tablet, Rfl: 1   diltiazem (CARDIZEM CD) 180 MG 24 hr capsule, TAKE 1 CAPSULE(180 MG) BY MOUTH DAILY, Disp: 90 capsule, Rfl: 2   LINZESS 145 MCG CAPS capsule, Take 145 mcg by mouth daily., Disp: , Rfl:    nitroGLYCERIN (NITROSTAT) 0.4 MG SL tablet, Place 1 tablet (0.4 mg total) under the tongue every 5 (five) minutes as needed for chest pain., Disp: 30 tablet, Rfl: 0   REPATHA SURECLICK 140 MG/ML SOAJ, ADMINISTER 1 ML UNDER THE SKIN EVERY 14 DAYS, Disp: 2 mL, Rfl: 12   Study - [name] - olpasiran (AMG 890) 142 mg/mL or placebo SQ injection (PI-Alyssa Ritter), Inject 142 mg into the skin once. For Investigational Use Only. Inject 1 mL (1 prefilled syringe) subcutaneously into appropriate injection site per protocol every 12 weeks. (Approved injection site(s): upper arm, upper thigh & abdomen). Please contact [facility] for any questions or concerns regarding this medication.,  Disp: , Rfl:    traZODone (DESYREL) 50 MG tablet, Take 50 mg by mouth at bedtime., Disp: , Rfl:   Current Facility-Administered Medications:    Study - [name] - olpasiran (AMG 890) 142 mg/mL or placebo SQ injection (PI-Alyssa Ritter), 142 mg, Subcutaneous, Once, Alyssa Ritter, Alyssa Abu, MD

## 2023-02-02 NOTE — Progress Notes (Signed)
Alyssa Ritter is here for follow up in [place] a.  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  She is scheduled for carotid endarterectomy by Dr. Myra Gianotti, and will see Dr. Dulce Sellar tomorrow for pre-op clearance.  Denies chest pain.  No issues with medication.  She remains on Repatha for elevated LDL, and is in the [place] A trial for Lpa.    Alert, oriented female in NAD BP 150/62  P 68 R 16 unlabored T36.7  Soft R carotid bruit.  I do not appreciate one on the left Lungs clear.  Cor regular with minimal SEM Abd soft without hepatosplenomegaly Ext no edema. Reduced pulses bilaterally    NSR.  Borderline QRS voltage.  No acute changes. When compared to 11/08/20, no significant change.    Impression:   CAD Sp PCI RCA  Carotid stenosis pre carotid endarterectomy  Hyperlipidemia on Repatha  PVD  Elevated Lp(a)  Plan   See vascular note  Continue Ocean a trial for elevated Lp(a)  Study labs today  FU Dr. Willy Eddy. Riley Kill, MD Medical Director, [facility]

## 2023-02-02 NOTE — Progress Notes (Unsigned)
Cardiology Office Note:    Date:  02/03/2023   ID:  Rosse, Knipe 07-Mar-1961, MRN [id number]  PCP:  Lonie Peak, PA-C  Cardiologist:  Norman Herrlich, MD    Referring MD: Lonie Peak, PA-C    ASSESSMENT:    1. Coronary artery disease of native artery of native heart with stable angina pectoris (HCC)   2. Hypercholesteremia   3. Elevated lipoprotein(a)   4. Bilateral carotid artery stenosis    PLAN:    In order of problems listed above: Optimized for planned surgical procedure requires no further cardiology evaluation if admitted or observed please place a monitored bed EKG postoperative day 1 Stable CAD following PCI 2022 continue her usual cardiac meds including antihypertensive and lipid-lowering Continue to trend record home blood pressures continue her current antihypertensive including diltiazem In the ocean a trial continue PCSK9 inhibitor   Next appointment: 6 months   Medication Adjustments/Labs and Tests Ordered: Current medicines are reviewed at length with the patient today.  Concerns regarding medicines are outlined above.  No orders of the defined types were placed in this encounter.  No orders of the defined types were placed in this encounter.   No chief complaint on file.   History of Present Illness:    Alyssa Ritter is a 62 y.o. female with a hx of CAD with PCI and stent of ostial right coronary artery 09/06/2020 20 hyperlipidemia carotid stenosis although vascular surgery in [place]  familial hyperlipidemia and elevated lipoprotein little a last seen 04/03/2019.  Last LP(a) remains quite elevated at 218.  She is scheduled for carotid endarterectomy tomorrow arteriogram 01/08/1999 2490% stenosis proximal left internal carotid artery. Her lipids are well-controlled Home blood pressure runs in the range of less than 140 and she is on good medical therapy including advanced lipid-lowering part of the ocean a trial with elevated  lipoprotein a. Cardiac perspective she is optimized having no anginal discomfort edema shortness of breath chest pain palpitation or syncope She was not advised to hold her clopidogrel preoperatively Compliance with diet, lifestyle and medications: Yes Past Medical History:  Diagnosis Date   Abnormal cardiac CT angiography 09/06/2020   Anxiety disorder    Benign essential tremor    CAD (coronary artery disease)    Carotid artery occlusion    Constipation    Coronary artery disease involving native coronary artery of native heart with angina pectoris (HCC)    Depression with anxiety    Hematuria    Hypercholesteremia    Peripheral vascular disease of extremity (HCC)    Progressive angina (HCC) 09/06/2020   TIA (transient ischemic attack) 11/08/2020   Weight loss, abnormal     Past Surgical History:  Procedure Laterality Date   CESAREAN SECTION     CORONARY STENT INTERVENTION N/A 09/06/2020   Procedure: CORONARY STENT INTERVENTION;  Surgeon: Marykay Lex, MD;  Location: [facility] INVASIVE CV LAB;  Service: Cardiovascular;  Laterality: N/A;   IR ANGIO INTRA EXTRACRAN SEL COM CAROTID INNOMINATE BILAT MOD SED  01/08/2023   IR ANGIO VERTEBRAL SEL VERTEBRAL BILAT MOD SED  01/08/2023   IR RADIOLOGIST EVAL & MGMT  12/22/2022   LEFT HEART CATH AND CORONARY ANGIOGRAPHY N/A 09/06/2020   Procedure: LEFT HEART CATH AND CORONARY ANGIOGRAPHY;  Surgeon: Marykay Lex, MD;  Location: [facility] INVASIVE CV LAB;  Service: Cardiovascular;  Laterality: N/A;   OOPHORECTOMY Bilateral    Right cyst   ROTATOR CUFF REPAIR Left 2006    Current Medications: Current Meds  Medication Sig   aspirin EC 81 MG tablet Take 81 mg by mouth daily. Swallow whole.   clonazePAM (KLONOPIN) 1 MG tablet Take 0.5-1 mg by mouth 2 (two) times daily as needed for anxiety.   clopidogrel (PLAVIX) 75 MG tablet TAKE 1 TABLET(75 MG) BY MOUTH DAILY   diltiazem (CARDIZEM CD) 180 MG 24 hr capsule TAKE 1 CAPSULE(180 MG) BY MOUTH DAILY   LINZESS 145  MCG CAPS capsule Take 145 mcg by mouth daily.   nitroGLYCERIN (NITROSTAT) 0.4 MG SL tablet Place 1 tablet (0.4 mg total) under the tongue every 5 (five) minutes as needed for chest pain.   REPATHA SURECLICK 140 MG/ML SOAJ ADMINISTER 1 ML UNDER THE SKIN EVERY 14 DAYS   Study - [name] - olpasiran (AMG 890) 142 mg/mL or placebo SQ injection (PI-Hilty) Inject 142 mg into the skin once. For Investigational Use Only. Inject 1 mL (1 prefilled syringe) subcutaneously into appropriate injection site per protocol every 12 weeks. (Approved injection site(s): upper arm, upper thigh & abdomen). Please contact [facility] for any questions or concerns regarding this medication.   traZODone (DESYREL) 50 MG tablet Take 50 mg by mouth at bedtime.     Allergies:   Penicillins and Statins   Social History   Socioeconomic History   Marital status: Married    Spouse name: Not on file   Number of children: Not on file   Years of education: Not on file   Highest education level: Not on file  Occupational History   Not on file  Tobacco Use   Smoking status: Former    Types: Cigarettes    Quit date: 01/2018    Years since quitting: 5.0    Passive exposure: Current   Smokeless tobacco: Former    Quit date: 08/31/2017  Vaping Use   Vaping Use: Former   Quit date: 01/29/2018  Substance and Sexual Activity   Alcohol use: Yes    Comment: occasionally   Drug use: Never   Sexual activity: Not on file  Other Topics Concern   Not on file  Social History Narrative   Not on file   Social Determinants of Health   Financial Resource Strain: Not on file  Food Insecurity: Not on file  Transportation Needs: Not on file  Physical Activity: Not on file  Stress: Not on file  Social Connections: Not on file     Family History: The patient's family history includes Arthritis in her mother and sister; CAD in her father; Colon polyps in her mother; Hypercholesterolemia in her father and mother;  Hypertension in her brother, father, and mother; Seizures in her mother; Stroke in her brother; Thyroid disease in her sister. There is no history of Colon cancer or Breast cancer. ROS:   Please see the history of present illness.    All other systems reviewed and are negative.  EKGs/Labs/Other Studies Reviewed:    The following studies were reviewed today:  Cardiac Studies & Procedures   CARDIAC CATHETERIZATION  CARDIAC CATHETERIZATION 09/06/2020  Narrative  CULPRIT LESION: Prox RCA lesion is 80% stenosed.  A drug-eluting stent was successfully placed using a STENT RESOLUTE ONYX 2.0X22. Postdilated 2.3 mm  Post intervention, there is a 0% residual stenosis.  Ost RCA lesion is 50% stenosed -noted to be significant after initial stent placement  A drug-eluting stent was successfully placed from the ostium overlapping previous stent, using a STENT RESOLUTE ONYX 2.25X8. Postdilated to 2.4 mm  Post intervention, there is a 0%  residual stenosis.  -----------------  -----------------  Suezanne Jacquet LM to Mid LM lesion is 40% stenosed.  The left ventricular systolic function is normal. The left ventricular ejection fraction is 55-65% by visual estimate.  LV end diastolic pressure is normal.  SUMMARY  Severe single-vessel disease involving the ostial and proximal RCA (50% - 80% stenoses -> positive by DFR = 0.86-0.88)  Progress successful DES PCI of the ostial-proximal RCA using 2 overlapping Resolute Onyx DES Stents (2.0 mm x 22 mm, 2.25 mm x 8 mm -> postdilated to 2.5-2.6 mm).  Moderate 40% proximal Left Main at a bend.  Otherwise relatively small caliber left system.  Preserved EF of 55% with normal LVEDP.   RECOMMENDATIONS  Okay for standing discharge  Continue Repatha, will add low-dose carvedilol  Continue risk factor modification  Follow-up with Dr. Almedia Balls, MD  Findings Coronary Findings Diagnostic  Dominance: Right  Left Main Ost LM to Mid LM  lesion is 40% stenosed. The lesion is located at the bend.  Left Anterior Descending Vessel is small. The vessel exhibits minimal luminal irregularities.  First Diagonal Branch Vessel is small in size.  First Septal Branch Vessel is small in size.  Second Diagonal Branch Vessel is small in size.  Lateral Second Diagonal Branch Vessel is small in size.  Third Diagonal Branch Vessel is small in size.  Left Circumflex Vessel is small.  First Obtuse Marginal Branch Vessel is small in size.  Left Posterior Atrioventricular Artery Vessel is small in size.  Right Coronary Artery Vessel is small. Ost RCA lesion is 50% stenosed. Prox RCA lesion is 80% stenosed. Vessel is the culprit lesion. The lesion is located at the bend, segmental, eccentric and irregular. Pressure wire/FFR was performed on the lesion. DFR 0.86-0.88 (SIGNIFICANT)  Acute Marginal Branch Vessel is small in size.  Right Ventricular Branch Vessel is small in size.  Right Posterior Descending Artery Vessel is small in size.  Right Posterior Atrioventricular Artery Vessel is small in size.  First Right Posterolateral Branch Vessel is small in size.  Intervention  Ost RCA lesion Stent Lesion length:  4 mm. Lesion crossed with guidewire using a GUIDEWIRE PRESSURE COMET II. Pre-stent angioplasty was not performed. A drug-eluting stent was successfully placed using a STENT RESOLUTE ONYX 2.25X8. Maximum pressure: 20 atm. Inflation time: 30 sec. Minimum lumen area:  2.4 mm. Stent strut is well apposed. Stent overlaps previously placed stent. Post-stent angioplasty was performed using a BALLOON SAPPHIRE Maria Antonia 2.25X15. The ostium was flared to high ATM with stent balloon &amp; Walker balloon Post-Intervention Lesion Assessment The intervention was successful. Pre-interventional TIMI flow is 3. Post-intervention TIMI flow is 3. Treated lesion length:  4 mm. No complications occurred at this lesion. There is a 0%  residual stenosis post intervention.  Prox RCA lesion Stent Lesion length:  20 mm. CATH LAUNCHER 38F JR4 guide catheter was inserted. Lesion crossed with guidewire using a GUIDEWIRE PRESSURE COMET II. Pre-stent angioplasty was performed using a BALLOON SAPPHIRE 2.0X15. Maximum pressure:  8 atm. Inflation time:  20 sec. BALLOON SAPPHIRE 1.5X12 - 8 Atm x 20 Sec A drug-eluting stent was successfully placed using a STENT RESOLUTE ONYX 2.0X22. Maximum pressure: 16 atm. Inflation time: 20 sec. Minimum lumen area:  2.5 mm. Stent strut is well apposed. Post-stent angioplasty was performed using a BALLOON SAPPHIRE 2.0X15. Post-Intervention Lesion Assessment The intervention was successful. Pre-interventional TIMI flow is 3. Post-intervention TIMI flow is 3. Treated lesion length:  22 mm. No complications  occurred at this lesion. Cross-sectional area: 2.3 mm. On review post PCI images, it became clear that the ostium was more significant than initially felt.  A second stent was then placed. There is a 0% residual stenosis post intervention.     ECHOCARDIOGRAM  ECHOCARDIOGRAM COMPLETE 11/09/2020  Narrative ECHOCARDIOGRAM REPORT    Patient Name:   TORYN GUNDER Barren Date of Exam: 11/09/2020 Medical Rec #:  [id number]             Height:       63.0 in Accession #:    [id number]            Weight:       143.0 lb Date of Birth:  08-Nov-1960             BSA:          1.677 m Patient Age:    59 years              BP:           124/94 mmHg Patient Gender: F                     HR:           57 bpm. Exam Location:  Inpatient  Procedure: 2D Echo, Cardiac Doppler, Color Doppler and Strain Analysis  Indications:    CVA  History:        Patient has no prior history of Echocardiogram examinations. CAD, TIA; Risk Factors:Dyslipidemia.  Sonographer:    Neomia Dear RDCS Referring Phys: [id number] MOHAMMAD L GARBA  IMPRESSIONS   1. Left ventricular ejection fraction, by estimation, is 60 to 65%. The left  ventricle has normal function. The left ventricle has no regional wall motion abnormalities. Left ventricular diastolic parameters are consistent with Grade I diastolic dysfunction (impaired relaxation). 2. Right ventricular systolic function is normal. The right ventricular size is normal. Tricuspid regurgitation signal is inadequate for assessing PA pressure. 3. The mitral valve is grossly normal. Trivial mitral valve regurgitation. No evidence of mitral stenosis. 4. The aortic valve was not well visualized. Aortic valve regurgitation is not visualized. No aortic stenosis is present. 5. The inferior vena cava is normal in size with greater than 50% respiratory variability, suggesting right atrial pressure of 3 mmHg.  Comparison(s): No prior Echocardiogram.  Conclusion(s)/Recommendation(s): No intracardiac source of embolism detected on this transthoracic study. A transesophageal echocardiogram is recommended to exclude cardiac source of embolism if clinically indicated.  FINDINGS Left Ventricle: Left ventricular ejection fraction, by estimation, is 60 to 65%. The left ventricle has normal function. The left ventricle has no regional wall motion abnormalities. The left ventricular internal cavity size was normal in size. There is no left ventricular hypertrophy. Left ventricular diastolic parameters are consistent with Grade I diastolic dysfunction (impaired relaxation).  Right Ventricle: The right ventricular size is normal. No increase in right ventricular wall thickness. Right ventricular systolic function is normal. Tricuspid regurgitation signal is inadequate for assessing PA pressure.  Left Atrium: Left atrial size was normal in size.  Right Atrium: Right atrial size was normal in size.  Pericardium: Trivial pericardial effusion is present. Presence of pericardial fat pad.  Mitral Valve: The mitral valve is grossly normal. Trivial mitral valve regurgitation. No evidence of mitral valve  stenosis.  Tricuspid Valve: The tricuspid valve is grossly normal. Tricuspid valve regurgitation is not demonstrated. No evidence of tricuspid stenosis.  Aortic Valve: The aortic valve was not well visualized. Aortic valve  regurgitation is not visualized. No aortic stenosis is present. Aortic valve mean gradient measures 4.0 mmHg. Aortic valve peak gradient measures 8.4 mmHg. Aortic valve area, by VTI measures 1.40 cm.  Pulmonic Valve: The pulmonic valve was grossly normal. Pulmonic valve regurgitation is not visualized. No evidence of pulmonic stenosis.  Aorta: The aortic root is normal in size and structure.  Venous: The inferior vena cava is normal in size with greater than 50% respiratory variability, suggesting right atrial pressure of 3 mmHg.  IAS/Shunts: The atrial septum is grossly normal.   LEFT VENTRICLE PLAX 2D LVIDd:         5.40 cm     Diastology LVIDs:         3.90 cm     LV e' medial:    6.96 cm/s LV PW:         0.80 cm     LV E/e' medial:  10.6 LV IVS:        0.80 cm     LV e' lateral:   6.96 cm/s LVOT diam:     1.60 cm     LV E/e' lateral: 10.6 LV SV:         45 LV SV Index:   27          2D Longitudinal Strain LVOT Area:     2.01 cm    2D Strain GLS Avg:     -19.7 %  LV Volumes (MOD) LV vol d, MOD A2C: 36.9 ml LV vol d, MOD A4C: 40.6 ml LV vol s, MOD A2C: 12.7 ml LV vol s, MOD A4C: 14.9 ml LV SV MOD A2C:     24.2 ml LV SV MOD A4C:     40.6 ml LV SV MOD BP:      24.8 ml  RIGHT VENTRICLE RV S prime:     8.70 cm/s  PULMONARY VEINS TAPSE (M-mode): 1.4 cm     A Reversal Duration: 109.00 msec A Reversal Velocity: 43.70 cm/s Diastolic Velocity:  40.30 cm/s S/D Velocity:        1.40 Systolic Velocity:   55.30 cm/s  LEFT ATRIUM             Index       RIGHT ATRIUM           Index LA diam:        3.50 cm 2.09 cm/m  RA Area:     10.40 cm LA Vol (A2C):   44.3 ml 26.42 ml/m RA Volume:   26.00 ml  15.51 ml/m LA Vol (A4C):   35.5 ml 21.17 ml/m LA Biplane Vol:  40.6 ml 24.21 ml/m AORTIC VALVE                   PULMONIC VALVE AV Area (Vmax):    1.20 cm    PV Vmax:       0.97 m/s AV Area (Vmean):   1.19 cm    PV Vmean:      71.700 cm/s AV Area (VTI):     1.40 cm    PV VTI:        0.328 m AV Vmax:           145.00 cm/s PV Peak grad:  3.8 mmHg AV Vmean:          96.900 cm/s PV Mean grad:  2.0 mmHg AV VTI:            0.320 m AV Peak Grad:  8.4 mmHg AV Mean Grad:      4.0 mmHg LVOT Vmax:         86.20 cm/s LVOT Vmean:        57.200 cm/s LVOT VTI:          0.223 m LVOT/AV VTI ratio: 0.70  AORTA Ao Root diam: 2.80 cm Ao Asc diam:  2.20 cm  MITRAL VALVE MV Area (PHT): 2.99 cm     SHUNTS MV Decel Time: 254 msec     Systemic VTI:  0.22 m MV E velocity: 73.70 cm/s   Systemic Diam: 1.60 cm MV A velocity: 102.00 cm/s MV E/A ratio:  0.72  Lennie Odor MD Electronically signed by Lennie Odor MD Signature Date/Time: 11/09/2020/12:55:42 PM    Final     CT SCANS  CT CORONARY MORPH W/CTA COR W/SCORE 08/26/2020  Addendum 08/26/2020  1:25 PM ADDENDUM REPORT: 08/26/2020 13:23  EXAM: Cardiac/Coronary  CT  TECHNIQUE: The patient was scanned on a [employer].  FINDINGS: A 120 kV prospective scan was triggered in the descending thoracic aorta at 111 HU's. Axial non-contrast 3 mm slices were carried out through the heart. The data set was analyzed on a dedicated work station and scored using the Agatson method. Gantry rotation speed was 250 msecs and collimation was .6 mm. No beta blockade and 0.8 mg of sl NTG was given. The 3D data set was reconstructed in 5% intervals of the 67-82 % of the R-R cycle. Diastolic phases were analyzed on a dedicated work station using MPR, MIP and VRT modes. The patient received 80 cc of contrast.  Quality: Good images but study limited by Noise and breathing artifact  Aorta: Normal size. Scattered calcifications of the ascending and descending aorta and aortic arch. No  dissection.  Aortic Valve:  Trileaflet.  No calcifications.  Coronary Arteries:  Normal coronary origin.  Right dominance.  RCA is a large dominant artery that gives rise to PDA and PLVB. The RCA is diffusely diseased with mild to moderate calcified plaque in the proximal, mid and distal RCA with associated stenosis of 25-49% in the proximal and distal RCA and possibly as high as 50-69% in the mid RCA.  Left main is a large artery that gives rise to LAD, Ramus and LCX arteries. There is mixed plaque throughout the LM with associated stenosis of 25-49% but could be as high as 50%. There is motion artifact that limits accurate assessment.  LAD is a large vessel that gives rise to a large D1. There is minimal calcified plaque in the proximal LAD with associated stenosis of 1-24%. There is mild calcified plaque in the mid LAD after the takeoff of D1 with associated stenosis of 25-49% followed non calcified plaque with 25-49% stenosis. There is mild calcified plaque in the proximal and mid D1 with associated stenosis of 25-49%.  LCX is a non-dominant artery that gives rise to one large OM1 branch. There is minimal calcified plaque in the mid LCx at the takeoff of the OM1 with associated stenosis of 1-24%.  Other findings:  Normal pulmonary vein drainage into the left atrium.  Normal let atrial appendage without a thrombus.  Normal size of the pulmonary artery.  IMPRESSION: 1. Coronary calcium score of 278. This was 98th percentile for age and sex matched control.  2.  Normal coronary origin with right dominance.  3. Mild to moderate atherosclerosis. CAD RADs 2. The LM is not adequately visualized due to motion artifact. There is at least  25-49% stenosis but may be higher.  4. Atherosclerosis of the ascending and descending aorta and aortic root.  5. Recommend Preventive therapy and aggressive risk factor modification.  6. Consider nuclear stress testing vs. Repeat  coronary CTA to assess left main further.  7.  This study has been submitted for FFR flow analysis.  Armanda Magic   Electronically Signed By: Armanda Magic On: 08/26/2020 13:23  Narrative EXAM: OVER-READ INTERPRETATION  CT CHEST  The following report is an over-read performed by radiologist Dr. Charlett Nose of [facility] on 08/26/2020. This over-read does not include interpretation of cardiac or coronary anatomy or pathology. The coronary CTA interpretation by the cardiologist is attached.  COMPARISON:  None.  FINDINGS: Vascular: Heart is normal size. Aorta normal caliber. Scattered aortic calcifications.  Mediastinum/Nodes: No adenopathy  Lungs/Pleura: No confluent opacities or effusions.  Upper Abdomen: Imaging into the upper abdomen demonstrates no acute findings.  Musculoskeletal: Chest wall soft tissues are unremarkable. No acute bony abnormality.  IMPRESSION: Aortic atherosclerosis.  No acute extra cardiac abnormality.  Electronically Signed: By: Charlett Nose M.D. On: 08/26/2020 10:33          EKG:  EKG ordered today and personally reviewed.  The ekg ordered today demonstrates sinus rhythm normal EKG  Recent Labs: 04/02/2022: ALT 8 01/08/2023: BUN 12; Creatinine, Ser 0.99; Hemoglobin 15.1; Platelets 305; Potassium 4.2; Sodium 138  Recent Lipid Panel    Component Value Date/Time   CHOL 150 04/02/2022 0939   TRIG 207 (H) 04/02/2022 0939   HDL 49 04/02/2022 0939   CHOLHDL 3.1 04/02/2022 0939   CHOLHDL 2.7 11/09/2020 0149   VLDL 19 11/09/2020 0149   LDLCALC 67 04/02/2022 0939    Physical Exam:    VS:  BP 118/80 (BP Location: Left Arm, Patient Position: Sitting)   Pulse 67   Ht 5\' 3"  (1.6 m)   Wt 142 lb 3.2 oz (64.5 kg)   SpO2 99%   BMI 25.19 kg/m     Wt Readings from Last 3 Encounters:  02/03/23 142 lb 3.2 oz (64.5 kg)  02/02/23 142 lb 3.2 oz (64.5 kg)  01/18/23 141 lb (64 kg)     GEN: She has no xanthoma or xanthelasma or  arcus senilis well nourished, well developed in no acute distress HEENT: Normal NECK: No JVD; No carotid bruits LYMPHATICS: No lymphadenopathy CARDIAC: RRR, no murmurs, rubs, gallops RESPIRATORY:  Clear to auscultation without rales, wheezing or rhonchi  ABDOMEN: Soft, non-tender, non-distended MUSCULOSKELETAL:  No edema; No deformity  SKIN: Warm and dry NEUROLOGIC:  Alert and oriented x 3 PSYCHIATRIC:  Normal affect    Signed, Norman Herrlich, MD  02/03/2023 9:20 AM    [facility]

## 2023-02-03 ENCOUNTER — Encounter: Payer: Self-pay | Admitting: Cardiology

## 2023-02-03 ENCOUNTER — Ambulatory Visit: Payer: Medicaid Other | Attending: Cardiology | Admitting: Cardiology

## 2023-02-03 VITALS — BP 118/80 | HR 67 | Ht 63.0 in | Wt 142.2 lb

## 2023-02-03 DIAGNOSIS — I6523 Occlusion and stenosis of bilateral carotid arteries: Secondary | ICD-10-CM | POA: Diagnosis not present

## 2023-02-03 DIAGNOSIS — I25118 Atherosclerotic heart disease of native coronary artery with other forms of angina pectoris: Secondary | ICD-10-CM

## 2023-02-03 DIAGNOSIS — E7841 Elevated Lipoprotein(a): Secondary | ICD-10-CM | POA: Diagnosis not present

## 2023-02-03 DIAGNOSIS — E7800 Pure hypercholesterolemia, unspecified: Secondary | ICD-10-CM

## 2023-02-03 NOTE — Patient Instructions (Signed)
Medication Instructions:  Your physician recommends that you continue on your current medications as directed. Please refer to the Current Medication list given to you today.  *If you need a refill on your cardiac medications before your next appointment, please call your pharmacy*   Lab Work: None If you have labs (blood work) drawn today and your tests are completely normal, you will receive your results only by: MyChart Message (if you have MyChart) OR A paper copy in the mail If you have any lab test that is abnormal or we need to change your treatment, we will call you to review the results.   Testing/Procedures: None   Follow-Up: At [facility], you and your health needs are our priority.  As part of our continuing mission to provide you with exceptional heart care, we have created designated Provider Care Teams.  These Care Teams include your primary Cardiologist (physician) and Advanced Practice Providers (APPs -  Physician Assistants and Nurse Practitioners) who all work together to provide you with the care you need, when you need it.  We recommend signing up for the patient portal called "MyChart".  Sign up information is provided on this After Visit Summary.  MyChart is used to connect with patients for Virtual Visits (Telemedicine).  Patients are able to view lab/test results, encounter notes, upcoming appointments, etc.  Non-urgent messages can be sent to your provider as well.   To learn more about what you can do with MyChart, go to [email or web address].    Your next appointment:   6 month(s)  Provider:   Brian Munley, MD    Other Instructions None  

## 2023-02-04 NOTE — Research (Addendum)
[name] week 24 lab results:           Are there any labs that are clinically significant?  Yes [] OR No[] 

## 2023-02-10 ENCOUNTER — Other Ambulatory Visit: Payer: Self-pay

## 2023-02-10 DIAGNOSIS — I6523 Occlusion and stenosis of bilateral carotid arteries: Secondary | ICD-10-CM

## 2023-02-16 NOTE — Pre-Procedure Instructions (Signed)
Surgical Instructions    Your procedure is scheduled on Friday, June 28th.  Report to [facility] Main Entrance "A" at 05:30 A.M., then check in with the Admitting office.  Call this number if you have problems the morning of surgery:  [phone]  If you have any questions prior to your surgery date call [phone]: Open Monday-Friday 8am-4pm If you experience any cold or flu symptoms such as cough, fever, chills, shortness of breath, etc. between now and your scheduled surgery, please notify us at the above number.     Remember:  Do not eat or drink after midnight the night before your surgery     Take these medicines the morning of surgery with A SIP OF WATER  aspirin EC  clopidogrel (PLAVIX)  diltiazem (CARDIZEM CD)    If needed: clonazePAM (KLONOPIN)  nitroGLYCERIN (NITROSTAT)   As of today, STOP taking any Aleve, Naproxen, Ibuprofen, Motrin, Advil, Goody's, BC's, all herbal medications, fish oil, and all vitamins.                     Do NOT Smoke (Tobacco/Vaping) for 24 hours prior to your procedure.  If you use a CPAP at night, you may bring your mask/headgear for your overnight stay.   Contacts, glasses, piercing's, hearing aid's, dentures or partials may not be worn into surgery, please bring cases for these belongings.    For patients admitted to the hospital, discharge time will be determined by your treatment team.   Patients discharged the day of surgery will not be allowed to drive home, and someone needs to stay with them for 24 hours.  SURGICAL WAITING ROOM VISITATION Patients having surgery or a procedure may have no more than 2 support people in the waiting area - these visitors may rotate.   Children under the age of 70 must have an adult with them who is not the patient. If the patient needs to stay at the hospital during part of their recovery, the visitor guidelines for inpatient rooms apply. Pre-op nurse will coordinate an appropriate time for 1  support person to accompany patient in pre-op.  This support person may not rotate.   Please refer to the [facility] website for the visitor guidelines for Inpatients (after your surgery is over and you are in a regular room).    Special instructions:   [place]- Preparing For Surgery  Before surgery, you can play an important role. Because skin is not sterile, your skin needs to be as free of germs as possible. You can reduce the number of germs on your skin by washing with CHG (chlorahexidine gluconate) Soap before surgery.  CHG is an antiseptic cleaner which kills germs and bonds with the skin to continue killing germs even after washing.    Oral Hygiene is also important to reduce your risk of infection.  Remember - BRUSH YOUR TEETH THE MORNING OF SURGERY WITH YOUR REGULAR TOOTHPASTE  Please do not use if you have an allergy to CHG or antibacterial soaps. If your skin becomes reddened/irritated stop using the CHG.  Do not shave (including legs and underarms) for at least 48 hours prior to first CHG shower. It is OK to shave your face.  Please follow these instructions carefully.   Shower the NIGHT BEFORE SURGERY and the MORNING OF SURGERY  If you chose to wash your hair, wash your hair first as usual with your normal shampoo.  After you shampoo, rinse your hair and body thoroughly to  remove the shampoo.  Use CHG Soap as you would any other liquid soap. You can apply CHG directly to the skin and wash gently with a scrungie or a clean washcloth.   Apply the CHG Soap to your body ONLY FROM THE NECK DOWN.  Do not use on open wounds or open sores. Avoid contact with your eyes, ears, mouth and genitals (private parts). Wash Face and genitals (private parts)  with your normal soap.   Wash thoroughly, paying special attention to the area where your surgery will be performed.  Thoroughly rinse your body with warm water from the neck down.  DO NOT shower/wash with your normal soap after  using and rinsing off the CHG Soap.  Pat yourself dry with a CLEAN TOWEL.  Wear CLEAN PAJAMAS to bed the night before surgery  Place CLEAN SHEETS on your bed the night before your surgery  DO NOT SLEEP WITH PETS.   Day of Surgery: Take a shower with CHG soap. Do not wear jewelry or makeup Do not wear lotions, powders, perfumes/colognes, or deodorant. Do not shave 48 hours prior to surgery.  Men may shave face and neck. Do not bring valuables to the hospital.  [facility] is not responsible for any belongings or valuables. Do not wear nail polish, gel polish, artificial nails, or any other type of covering on natural nails (fingers and toes) If you have artificial nails or gel coating that need to be removed by a nail salon, please have this removed prior to surgery. Artificial nails or gel coating may interfere with anesthesia's ability to adequately monitor your vital signs. Wear Clean/Comfortable clothing the morning of surgery Remember to brush your teeth WITH YOUR REGULAR TOOTHPASTE.   Please read over the following fact sheets that you were given.    If you received a COVID test during your pre-op visit  it is requested that you wear a mask when out in public, stay away from anyone that may not be feeling well and notify your surgeon if you develop symptoms. If you have been in contact with anyone that has tested positive in the last 10 days please notify you surgeon.

## 2023-02-17 ENCOUNTER — Encounter (HOSPITAL_COMMUNITY)
Admission: RE | Admit: 2023-02-17 | Discharge: 2023-02-17 | Disposition: A | Discharge status: Home / Self Care | Payer: Medicaid Other | Source: Ambulatory Visit | Attending: Surgery | Admitting: Surgery

## 2023-02-17 ENCOUNTER — Encounter (HOSPITAL_COMMUNITY): Payer: Self-pay

## 2023-02-17 ENCOUNTER — Other Ambulatory Visit: Payer: Self-pay

## 2023-02-17 VITALS — BP 133/64 | HR 66 | Temp 97.9°F | Resp 18 | Ht 63.0 in | Wt 141.4 lb

## 2023-02-17 DIAGNOSIS — I739 Peripheral vascular disease, unspecified: Secondary | ICD-10-CM | POA: Insufficient documentation

## 2023-02-17 DIAGNOSIS — I2510 Atherosclerotic heart disease of native coronary artery without angina pectoris: Secondary | ICD-10-CM | POA: Insufficient documentation

## 2023-02-17 DIAGNOSIS — Z87891 Personal history of nicotine dependence: Secondary | ICD-10-CM | POA: Diagnosis not present

## 2023-02-17 DIAGNOSIS — I6523 Occlusion and stenosis of bilateral carotid arteries: Secondary | ICD-10-CM | POA: Diagnosis not present

## 2023-02-17 DIAGNOSIS — Z8673 Personal history of transient ischemic attack (TIA), and cerebral infarction without residual deficits: Secondary | ICD-10-CM | POA: Insufficient documentation

## 2023-02-17 DIAGNOSIS — Z01812 Encounter for preprocedural laboratory examination: Secondary | ICD-10-CM | POA: Diagnosis present

## 2023-02-17 DIAGNOSIS — Z01818 Encounter for other preprocedural examination: Secondary | ICD-10-CM

## 2023-02-17 DIAGNOSIS — Z955 Presence of coronary angioplasty implant and graft: Secondary | ICD-10-CM | POA: Diagnosis not present

## 2023-02-17 HISTORY — DX: Personal history of urinary calculi: Z87.442

## 2023-02-17 HISTORY — DX: Other complications of anesthesia, initial encounter: T88.59XA

## 2023-02-17 HISTORY — DX: Nausea with vomiting, unspecified: R11.2

## 2023-02-17 HISTORY — DX: Other specified postprocedural states: Z98.890

## 2023-02-17 HISTORY — DX: Family history of other specified conditions: Z84.89

## 2023-02-17 HISTORY — DX: Depression, unspecified: F32.A

## 2023-02-17 HISTORY — DX: Peripheral vascular disease, unspecified: I73.9

## 2023-02-17 HISTORY — DX: Headache, unspecified: R51.9

## 2023-02-17 LAB — URINALYSIS, ROUTINE W REFLEX MICROSCOPIC
Bilirubin Urine: NEGATIVE
Glucose, UA: NEGATIVE mg/dL
Ketones, ur: NEGATIVE mg/dL
Leukocytes,Ua: NEGATIVE
Nitrite: NEGATIVE
Protein, ur: NEGATIVE mg/dL
Specific Gravity, Urine: 1.001 — ABNORMAL LOW (ref 1.005–1.030)
pH: 5 (ref 5.0–8.0)

## 2023-02-17 LAB — COMPREHENSIVE METABOLIC PANEL
ALT: 11 U/L (ref 0–44)
AST: 22 U/L (ref 15–41)
Albumin: 4.3 g/dL (ref 3.5–5.0)
Alkaline Phosphatase: 103 U/L (ref 38–126)
Anion gap: 11 (ref 5–15)
BUN: 10 mg/dL (ref 6–20)
CO2: 21 mmol/L — ABNORMAL LOW (ref 22–32)
Calcium: 9.2 mg/dL (ref 8.9–10.3)
Chloride: 105 mmol/L (ref 98–111)
Creatinine, Ser: 0.94 mg/dL (ref 0.44–1.00)
GFR, Estimated: >60 mL/min (ref >60)
Glucose, Bld: 96 mg/dL (ref 70–99)
Potassium: 3.5 mmol/L (ref 3.5–5.1)
Sodium: 137 mmol/L (ref 135–145)
Total Bilirubin: 0.3 mg/dL (ref 0.3–1.2)
Total Protein: 7.5 g/dL (ref 6.5–8.1)

## 2023-02-17 LAB — CBC
HCT: 45.4 % (ref 36.0–46.0)
Hemoglobin: 14.8 g/dL (ref 12.0–15.0)
MCH: 28.4 pg (ref 26.0–34.0)
MCHC: 32.6 g/dL (ref 30.0–36.0)
MCV: 87.1 fL (ref 80.0–100.0)
Platelets: 293 10*3/uL (ref 150–400)
RBC: 5.21 MIL/uL — ABNORMAL HIGH (ref 3.87–5.11)
RDW: 13.9 % (ref 11.5–15.5)
WBC: 7.4 10*3/uL (ref 4.0–10.5)
nRBC: 0 % (ref 0.0–0.2)

## 2023-02-17 LAB — TYPE AND SCREEN
ABO/RH(D): O POS
Antibody Screen: NEGATIVE

## 2023-02-17 LAB — PROTIME-INR
INR: 0.9 (ref 0.8–1.2)
Prothrombin Time: 12.5 seconds (ref 11.4–15.2)

## 2023-02-17 LAB — APTT: aPTT: 28 seconds (ref 24–36)

## 2023-02-17 LAB — SURGICAL PCR SCREEN
MRSA, PCR: NEGATIVE
Staphylococcus aureus: NEGATIVE

## 2023-02-17 NOTE — Progress Notes (Signed)
PCP - Lonie Peak, PA-C Cardiologist - Dr. Norman Herrlich - Last office visit 01/12/2023 and he is aware of upcoming surgery  PPM/ICD - Denies Device Orders - n/a Rep Notified - n/a  Chest x-ray - Denies - Pt did complete at CT Lung Screening at [facility]. Results available in [facility] EKG - 02/03/2023 Stress Test - Per pt, many years ago ECHO - 11/09/2020 Cardiac Cath - 09/06/2020 with 2 stents placed  Sleep Study - Denies CPAP - n/a  No DM  Last dose of GLP1 agonist- n/a GLP1 instructions: n/a  Blood Thinner Instructions: Per surgeon, pt will continue to take her Plavix through DOS Aspirin Instructions: Per surgeon, pt will continue to take her ASA through DOS  NPO after midnight  COVID TEST- n/a   Anesthesia review: Yes. Cardiac Hx including PVD, CAD with 2 stents placed and angina. Hx of TIA in 2022 with follow-up Cerebral Angiogram last month, which resulted with no acute findings. Pt states that she has not taken any of her nitroglycerin in a while. RN instructed pt to let us know if she takes a dose prior to her surgery.      Patient denies shortness of breath, fever, cough and chest pain at PAT appointment. Pt denies any respiratory illness/infection in the last two months.   All instructions explained to the patient, with a verbal understanding of the material. Patient agrees to go over the instructions while at home for a better understanding. Patient also instructed to self quarantine after being tested for COVID-19. The opportunity to ask questions was provided.

## 2023-02-18 NOTE — Anesthesia Preprocedure Evaluation (Addendum)
Anesthesia Evaluation  Patient identified by MRN, date of birth, ID band Patient awake    Reviewed: Allergy & Precautions, H&P , NPO status , Patient's Chart, lab work & pertinent test results  History of Anesthesia Complications (+) PONV and history of anesthetic complications  Airway Mallampati: II  TM Distance: >3 FB Neck ROM: Full    Dental no notable dental hx.    Pulmonary neg pulmonary ROS, former smoker   Pulmonary exam normal breath sounds clear to auscultation       Cardiovascular + CAD, + Cardiac Stents and + Peripheral Vascular Disease  Normal cardiovascular exam Rhythm:Regular Rate:Normal     Neuro/Psych TIACVA  negative psych ROS   GI/Hepatic negative GI ROS, Neg liver ROS,,,  Endo/Other  negative endocrine ROS    Renal/GU negative Renal ROS  negative genitourinary   Musculoskeletal negative musculoskeletal ROS (+)    Abdominal   Peds negative pediatric ROS (+)  Hematology negative hematology ROS (+)   Anesthesia Other Findings   Reproductive/Obstetrics negative OB ROS                             Anesthesia Physical Anesthesia Plan  ASA: 3  Anesthesia Plan: General   Post-op Pain Management:    Induction: Intravenous  PONV Risk Score and Plan: 3 and Ondansetron, Dexamethasone, Treatment may vary due to age or medical condition and Droperidol  Airway Management Planned: Oral ETT  Additional Equipment:   Intra-op Plan:   Post-operative Plan: Extubation in OR  Informed Consent: I have reviewed the patients History and Physical, chart, labs and discussed the procedure including the risks, benefits and alternatives for the proposed anesthesia with the patient or authorized representative who has indicated his/her understanding and acceptance.     Dental advisory given  Plan Discussed with: CRNA and Surgeon  Anesthesia Plan Comments: (PAT note written  02/18/2023 by Shonna Chock, PA-C.  )       Anesthesia Quick Evaluation

## 2023-02-18 NOTE — Progress Notes (Signed)
Anesthesia Chart Review:  Case: [id number] Date/Time: 02/26/23 0715   Procedure: ENDARTERECTOMY CAROTID (Left)   Anesthesia type: General   Pre-op diagnosis: Bilateral carotid artery stenosis   [facility] / [facility]   Surgeons: Nada Libman, MD       DISCUSSION: Patient is a 62 year old female scheduled for the above procedure.  History includes former smoker (quit 01/30/12), post-operative N/V, CAD (s/p overlapping DES x2 RCA 09/06/20 for progressive angina), PVD (right CIA stent 09/29/01), carotid artery stenosis, hypercholesterolemia, TIA (11/08/20), migraines, anxiety, hematuria. Reported she and mother had anesthesia issues of prolonged emergence and hypotension.  Last cardiology visit was on 02/03/23 with Dr. Dulce Sellar.  CAD felt stable.  Continue home monitoring of BP.  And Ocean trial, continue PCSK9 inhibitor. He noted plans for left carotid endarterectomy and wrote, "Cardiac perspective she is optimized having no anginal discomfort edema shortness of breath chest pain palpitation or syncope She was not advised to hold her clopidogrel preoperatively"  (Per Dr. Estanislado Spire 01/18/23, "I will not stop her aspirin and Plavix."  Anesthesia team to evaluate on the day of surgery.  She denied any recent nitro use.   VS: BP 133/64   Pulse 66   Temp 36.6 C   Resp 18   Ht 5\' 3"  (1.6 m)   Wt 64.1 kg   SpO2 100%   BMI 25.05 kg/m    PROVIDERS: Lonie Peak, PA-C is PCP  Norman Herrlich, MD is cardiologist Myra Gianotti, V. Anner Crete, MD is vascular surgeon   LABS: Labs reviewed: Acceptable for surgery. (all labs ordered are listed, but only abnormal results are displayed)  Labs Reviewed  CBC - Abnormal; Notable for the following components:      Result Value   RBC 5.21 (*)    All other components within normal limits  COMPREHENSIVE METABOLIC PANEL - Abnormal; Notable for the following components:   CO2 21 (*)    All other components within normal limits  URINALYSIS, ROUTINE W REFLEX  MICROSCOPIC - Abnormal; Notable for the following components:   Color, Urine COLORLESS (*)    Specific Gravity, Urine 1.001 (*)    Hgb urine dipstick SMALL (*)    Bacteria, UA RARE (*)    All other components within normal limits  SURGICAL PCR SCREEN  PROTIME-INR  APTT  TYPE AND SCREEN     IMAGES: CT Lung Cancer Screening 01/12/23 [facility]): IMPRESSION: Scattered micronodules. No suspicious pulmonary nodules.  ACR Lung-RADS: 2S (Benign with Other Potentially Clinically Significant Findings)   RECOMMENDATIONS:  -Continue annual screening with low dose CT chest in 12 months.  -Moderate coronary artery calcifications. Recommend PCP evaluation for cardiovascular risk assessment.    Cerebral angiogram 01/08/23: IMPRESSION: - Approximately 2 mm conical outpouching in the left posterior communicating artery region most likely representing an infundibulum. - Approximately 50% stenosis of the distal left common carotid artery at the bifurcation, associated with an approximately 90% stenosis of the proximal left internal carotid artery at the bulb. - 75% stenosis of the proximal right subclavian artery with post stenotic dilatation.   PLAN: - Findings reviewed with the patient and the spouse. - We will obtain an MRA of the brain in 2 years to evaluate for interval changes in the left PCOM region infundibulum. - Patient advised to relay the left proximal ICA stenosis to her vascular surgeon.   EKG: EKG 02/03/23: NSR   CV: US Carotid 12/01/22: Summary:  Right Carotid: Velocities in the right ICA are consistent with  a 1-39%  stenosis.  Left Carotid: Velocities in the left ICA are consistent with a 60-79%  stenosis.  Vertebrals: Bilateral vertebral arteries demonstrate antegrade flow.  Subclavians: Normal flow hemodynamics were seen in bilateral subclavian               arteries.    Echo 11/09/20: IMPRESSIONS   1. Left ventricular ejection fraction, by estimation, is 60 to 65%.  The  left ventricle has normal function. The left ventricle has no regional  wall motion abnormalities. Left ventricular diastolic parameters are  consistent with Grade I diastolic  dysfunction (impaired relaxation).   2. Right ventricular systolic function is normal. The right ventricular  size is normal. Tricuspid regurgitation signal is inadequate for assessing  PA pressure.   3. The mitral valve is grossly normal. Trivial mitral valve  regurgitation. No evidence of mitral stenosis.   4. The aortic valve was not well visualized. Aortic valve regurgitation  is not visualized. No aortic stenosis is present.   5. The inferior vena cava is normal in size with greater than 50%  respiratory variability, suggesting right atrial pressure of 3 mmHg.  - Comparison(s): No prior Echocardiogram.  - Conclusion(s)/Recommendation(s): No intracardiac source of embolism  detected on this transthoracic study. A transesophageal echocardiogram is  recommended to exclude cardiac source of embolism if clinically indicated.    Cardiac cath 09/06/20: SUMMARY Severe single-vessel disease involving the ostial and proximal RCA (50% - 80% stenoses -> positive by DFR = 0.86-0.88) Progress successful DES PCI of the ostial-proximal RCA using 2 overlapping Resolute Onyx DES Stents (2.0 mm x 22 mm, 2.25 mm x 8 mm -> postdilated to 2.5-2.6 mm). Moderate 40% proximal Left Main at a bend.  Otherwise relatively small caliber left system. Preserved EF of 55% with normal LVEDP.      Past Medical History:  Diagnosis Date   Abnormal cardiac CT angiography 09/06/2020   Anxiety disorder    Benign essential tremor    CAD (coronary artery disease)    Carotid artery occlusion    Complication of anesthesia    Hard time waking up and BP drops   Constipation    Coronary artery disease involving native coronary artery of native heart with angina pectoris (HCC)    Depression    Family history of adverse reaction to anesthesia     Mother had same reaction to anesthesia   Headache    Hx Migraines   Hematuria    History of kidney stones    Hypercholesteremia    Peripheral vascular disease (HCC)    Left Carotid Stenosis   Peripheral vascular disease of extremity (HCC)    PONV (postoperative nausea and vomiting)    Progressive angina (HCC) 09/06/2020   Stroke (HCC) 10/2020   TIA   TIA (transient ischemic attack) 11/08/2020   Weight loss, abnormal     Past Surgical History:  Procedure Laterality Date   CESAREAN SECTION  1980   COLONOSCOPY     CORONARY STENT INTERVENTION N/A 09/06/2020   Procedure: CORONARY STENT INTERVENTION;  Surgeon: Marykay Lex, MD;  Location: [facility] INVASIVE CV LAB;  Service: Cardiovascular;  Laterality: N/A;   IR ANGIO INTRA EXTRACRAN SEL COM CAROTID INNOMINATE BILAT MOD SED  01/08/2023   IR ANGIO VERTEBRAL SEL VERTEBRAL BILAT MOD SED  01/08/2023   IR RADIOLOGIST EVAL & MGMT  12/22/2022   LEFT HEART CATH AND CORONARY ANGIOGRAPHY N/A 09/06/2020   Procedure: LEFT HEART CATH AND CORONARY ANGIOGRAPHY;  Surgeon: Bryan Lemma  W, MD;  Location: [facility] INV[facility] CV LAB;  Service: Cardiovascular;  Laterality: N/A;   OOPHORECTOMY     Right cyst removed but both ovaries not removed   ROTATOR CUFF REPAIR Left 2006   TONSILLECTOMY     As a child   TUBAL LIGATION  1992    MEDICATIONS:  aspirin EC 81 MG tablet   clonazePAM (KLONOPIN) 1 MG tablet   clopidogrel (PLAVIX) 75 MG tablet   diltiazem (CARDIZEM CD) 180 MG 24 hr capsule   LINZESS 145 MCG CAPS capsule   nitroGLYCERIN (NITROSTAT) 0.4 MG SL tablet   REPATHA SURECLICK 140 MG/ML SOAJ   Study - [name] - olpasiran (AMG 890) 142 mg/mL or placebo SQ injection (PI-Hilty)   traZODone (DESYREL) 50 MG tablet   No current facility-administered medications for this encounter.    Shonna Chock, PA-C Surgical Short Stay/Anesthesiology [facility] Phone [phone] [facility] Phone [phone] 02/18/2023 12:45 PM

## 2023-02-26 ENCOUNTER — Inpatient Hospital Stay (HOSPITAL_COMMUNITY): Payer: Medicaid Other | Admitting: Certified Registered Nurse Anesthetist

## 2023-02-26 ENCOUNTER — Inpatient Hospital Stay (HOSPITAL_COMMUNITY): Payer: Medicaid Other | Admitting: Vascular Surgery

## 2023-02-26 ENCOUNTER — Other Ambulatory Visit: Payer: Self-pay

## 2023-02-26 ENCOUNTER — Encounter (HOSPITAL_COMMUNITY)
Admission: RE | Disposition: A | Discharge status: Home / Self Care | Payer: Self-pay | Source: Home / Self Care | Attending: Surgery

## 2023-02-26 ENCOUNTER — Inpatient Hospital Stay (HOSPITAL_COMMUNITY)
Admission: RE | Admit: 2023-02-26 | Discharge: 2023-02-27 | DRG: 039 | Disposition: A | Discharge status: Home / Self Care | Payer: Medicaid Other | Attending: Surgery | Admitting: Surgery

## 2023-02-26 DIAGNOSIS — Z79899 Other long term (current) drug therapy: Secondary | ICD-10-CM | POA: Diagnosis not present

## 2023-02-26 DIAGNOSIS — I6523 Occlusion and stenosis of bilateral carotid arteries: Secondary | ICD-10-CM

## 2023-02-26 DIAGNOSIS — I25110 Atherosclerotic heart disease of native coronary artery with unstable angina pectoris: Secondary | ICD-10-CM

## 2023-02-26 DIAGNOSIS — E7800 Pure hypercholesterolemia, unspecified: Secondary | ICD-10-CM | POA: Diagnosis present

## 2023-02-26 DIAGNOSIS — F419 Anxiety disorder, unspecified: Secondary | ICD-10-CM | POA: Diagnosis present

## 2023-02-26 DIAGNOSIS — Z8673 Personal history of transient ischemic attack (TIA), and cerebral infarction without residual deficits: Secondary | ICD-10-CM

## 2023-02-26 DIAGNOSIS — I739 Peripheral vascular disease, unspecified: Secondary | ICD-10-CM | POA: Diagnosis present

## 2023-02-26 DIAGNOSIS — Z87442 Personal history of urinary calculi: Secondary | ICD-10-CM

## 2023-02-26 DIAGNOSIS — I708 Atherosclerosis of other arteries: Secondary | ICD-10-CM | POA: Diagnosis present

## 2023-02-26 DIAGNOSIS — Z880 Allergy status to penicillin: Secondary | ICD-10-CM | POA: Diagnosis not present

## 2023-02-26 DIAGNOSIS — G250 Essential tremor: Secondary | ICD-10-CM | POA: Diagnosis present

## 2023-02-26 DIAGNOSIS — Z823 Family history of stroke: Secondary | ICD-10-CM

## 2023-02-26 DIAGNOSIS — Z7982 Long term (current) use of aspirin: Secondary | ICD-10-CM | POA: Diagnosis not present

## 2023-02-26 DIAGNOSIS — Z888 Allergy status to other drugs, medicaments and biological substances status: Secondary | ICD-10-CM | POA: Diagnosis not present

## 2023-02-26 DIAGNOSIS — Z79620 Long term (current) use of immunosuppressive biologic: Secondary | ICD-10-CM | POA: Diagnosis not present

## 2023-02-26 DIAGNOSIS — Z87891 Personal history of nicotine dependence: Secondary | ICD-10-CM

## 2023-02-26 DIAGNOSIS — I2510 Atherosclerotic heart disease of native coronary artery without angina pectoris: Secondary | ICD-10-CM | POA: Diagnosis present

## 2023-02-26 DIAGNOSIS — Z8249 Family history of ischemic heart disease and other diseases of the circulatory system: Secondary | ICD-10-CM | POA: Diagnosis not present

## 2023-02-26 DIAGNOSIS — Z83438 Family history of other disorder of lipoprotein metabolism and other lipidemia: Secondary | ICD-10-CM | POA: Diagnosis not present

## 2023-02-26 DIAGNOSIS — I6522 Occlusion and stenosis of left carotid artery: Secondary | ICD-10-CM

## 2023-02-26 DIAGNOSIS — F32A Depression, unspecified: Secondary | ICD-10-CM | POA: Diagnosis present

## 2023-02-26 DIAGNOSIS — Z7902 Long term (current) use of antithrombotics/antiplatelets: Secondary | ICD-10-CM

## 2023-02-26 HISTORY — PX: ENDARTERECTOMY: SHX5162

## 2023-02-26 HISTORY — DX: Occlusion and stenosis of left carotid artery: I65.22

## 2023-02-26 LAB — CREATININE, SERUM
Creatinine, Ser: 0.94 mg/dL (ref 0.44–1.00)
GFR, Estimated: >60 mL/min (ref >60)

## 2023-02-26 LAB — CBC
HCT: 33.6 % — ABNORMAL LOW (ref 36.0–46.0)
Hemoglobin: 11.4 g/dL — ABNORMAL LOW (ref 12.0–15.0)
MCH: 29.8 pg (ref 26.0–34.0)
MCHC: 33.9 g/dL (ref 30.0–36.0)
MCV: 87.7 fL (ref 80.0–100.0)
Platelets: 201 10*3/uL (ref 150–400)
RBC: 3.83 MIL/uL — ABNORMAL LOW (ref 3.87–5.11)
RDW: 13.5 % (ref 11.5–15.5)
WBC: 8.5 10*3/uL (ref 4.0–10.5)
nRBC: 0 % (ref 0.0–0.2)

## 2023-02-26 LAB — POCT ACTIVATED CLOTTING TIME
Activated Clotting Time: 256 seconds
Activated Clotting Time: 311 seconds

## 2023-02-26 LAB — ABO/RH: ABO/RH(D): O POS

## 2023-02-26 SURGERY — ENDARTERECTOMY, CAROTID
Anesthesia: General | Site: Neck | Laterality: Left

## 2023-02-26 MED ORDER — CHLORHEXIDINE GLUCONATE 0.12 % MT SOLN
15.0000 mL | Freq: Once | OROMUCOSAL | Status: AC
  Administered 2023-02-26: 15 mL via OROMUCOSAL
  Filled 2023-02-26: qty 15

## 2023-02-26 MED ORDER — HEPARIN 6000 UNIT IRRIGATION SOLUTION
Status: DC | PRN
  Administered 2023-02-26: 1

## 2023-02-26 MED ORDER — ALBUMIN HUMAN 5 % IV SOLN
INTRAVENOUS | Status: AC
  Administered 2023-02-26: 12.5 g via INTRAVENOUS
  Filled 2023-02-26: qty 250

## 2023-02-26 MED ORDER — DROPERIDOL 2.5 MG/ML IJ SOLN
INTRAMUSCULAR | Status: AC
  Filled 2023-02-26: qty 2

## 2023-02-26 MED ORDER — ROCURONIUM BROMIDE 10 MG/ML (PF) SYRINGE
PREFILLED_SYRINGE | INTRAVENOUS | Status: AC
  Filled 2023-02-26: qty 10

## 2023-02-26 MED ORDER — ONDANSETRON HCL 4 MG/2ML IJ SOLN: 4.0000 mg | Freq: Four times a day (QID) | INTRAMUSCULAR | Status: DC | PRN

## 2023-02-26 MED ORDER — OXYCODONE-ACETAMINOPHEN 5-325 MG PO TABS
1.0000 | ORAL_TABLET | ORAL | Status: DC | PRN
  Administered 2023-02-26 – 2023-02-27 (×3): 2 via ORAL
  Filled 2023-02-26 (×3): qty 2

## 2023-02-26 MED ORDER — DEXAMETHASONE SODIUM PHOSPHATE 10 MG/ML IJ SOLN
INTRAMUSCULAR | Status: DC | PRN
  Administered 2023-02-26: 10 mg via INTRAVENOUS

## 2023-02-26 MED ORDER — DROPERIDOL 2.5 MG/ML IJ SOLN
INTRAMUSCULAR | Status: DC | PRN
  Administered 2023-02-26: .625 mg via INTRAVENOUS

## 2023-02-26 MED ORDER — LIDOCAINE 2% (20 MG/ML) 5 ML SYRINGE
INTRAMUSCULAR | Status: AC
  Filled 2023-02-26: qty 5

## 2023-02-26 MED ORDER — HEPARIN SODIUM (PORCINE) 1000 UNIT/ML IJ SOLN
INTRAMUSCULAR | Status: DC | PRN
  Administered 2023-02-26: 6500 [IU] via INTRAVENOUS

## 2023-02-26 MED ORDER — LIDOCAINE HCL (PF) 1 % IJ SOLN
INTRAMUSCULAR | Status: AC
  Filled 2023-02-26: qty 30

## 2023-02-26 MED ORDER — OXYCODONE HCL 5 MG PO TABS: 5.0000 mg | ORAL_TABLET | Freq: Once | ORAL | Status: DC | PRN

## 2023-02-26 MED ORDER — METOPROLOL TARTRATE 5 MG/5ML IV SOLN: 2.0000 mg | INTRAVENOUS | Status: DC | PRN

## 2023-02-26 MED ORDER — LIDOCAINE 2% (20 MG/ML) 5 ML SYRINGE
INTRAMUSCULAR | Status: DC | PRN
  Administered 2023-02-26: 10 mg via INTRAVENOUS

## 2023-02-26 MED ORDER — ORAL CARE MOUTH RINSE: 15.0000 mL | Freq: Once | OROMUCOSAL | Status: AC

## 2023-02-26 MED ORDER — ROCURONIUM BROMIDE 10 MG/ML (PF) SYRINGE
PREFILLED_SYRINGE | INTRAVENOUS | Status: DC | PRN
  Administered 2023-02-26: 60 mg via INTRAVENOUS
  Administered 2023-02-26: 20 mg via INTRAVENOUS

## 2023-02-26 MED ORDER — SODIUM CHLORIDE 0.9 % IV SOLN
0.1500 ug/kg/min | INTRAVENOUS | Status: AC
  Administered 2023-02-26: .1 ug/kg/min via INTRAVENOUS
  Filled 2023-02-26: qty 1000

## 2023-02-26 MED ORDER — DILTIAZEM HCL ER COATED BEADS 180 MG PO CP24
180.0000 mg | ORAL_CAPSULE | Freq: Every day | ORAL | Status: DC
  Administered 2023-02-26 – 2023-02-27 (×2): 180 mg via ORAL
  Filled 2023-02-26 (×2): qty 1

## 2023-02-26 MED ORDER — SODIUM CHLORIDE 0.9 % IV SOLN: INTRAVENOUS | Status: DC

## 2023-02-26 MED ORDER — DOCUSATE SODIUM 100 MG PO CAPS
100.0000 mg | ORAL_CAPSULE | Freq: Every day | ORAL | Status: DC
  Filled 2023-02-26: qty 1

## 2023-02-26 MED ORDER — HYDRALAZINE HCL 20 MG/ML IJ SOLN: 5.0000 mg | INTRAMUSCULAR | Status: DC | PRN

## 2023-02-26 MED ORDER — CHLORHEXIDINE GLUCONATE CLOTH 2 % EX PADS: 6.0000 | MEDICATED_PAD | Freq: Once | CUTANEOUS | Status: DC

## 2023-02-26 MED ORDER — VANCOMYCIN HCL IN DEXTROSE 1-5 GM/200ML-% IV SOLN
1000.0000 mg | INTRAVENOUS | Status: AC
  Administered 2023-02-26: 1000 mg via INTRAVENOUS
  Filled 2023-02-26: qty 200

## 2023-02-26 MED ORDER — HEPARIN 6000 UNIT IRRIGATION SOLUTION
Status: AC
  Filled 2023-02-26: qty 500

## 2023-02-26 MED ORDER — LABETALOL HCL 5 MG/ML IV SOLN: 10.0000 mg | INTRAVENOUS | Status: DC | PRN

## 2023-02-26 MED ORDER — PROTAMINE SULFATE 10 MG/ML IV SOLN
INTRAVENOUS | Status: DC | PRN
  Administered 2023-02-26: 50 mg via INTRAVENOUS

## 2023-02-26 MED ORDER — FENTANYL CITRATE (PF) 250 MCG/5ML IJ SOLN
INTRAMUSCULAR | Status: AC
  Filled 2023-02-26: qty 5

## 2023-02-26 MED ORDER — CLOPIDOGREL BISULFATE 75 MG PO TABS
75.0000 mg | ORAL_TABLET | Freq: Every day | ORAL | Status: DC
  Administered 2023-02-27: 75 mg via ORAL
  Filled 2023-02-26: qty 1

## 2023-02-26 MED ORDER — HEPARIN SODIUM (PORCINE) 1000 UNIT/ML IJ SOLN
INTRAMUSCULAR | Status: AC
  Filled 2023-02-26: qty 10

## 2023-02-26 MED ORDER — MIDAZOLAM HCL 2 MG/2ML IJ SOLN
INTRAMUSCULAR | Status: AC
  Filled 2023-02-26: qty 2

## 2023-02-26 MED ORDER — HYDROMORPHONE HCL 1 MG/ML IJ SOLN: 0.2500 mg | INTRAMUSCULAR | Status: DC | PRN

## 2023-02-26 MED ORDER — DEXAMETHASONE SODIUM PHOSPHATE 10 MG/ML IJ SOLN
INTRAMUSCULAR | Status: AC
  Filled 2023-02-26: qty 1

## 2023-02-26 MED ORDER — ASPIRIN 81 MG PO TBEC
81.0000 mg | DELAYED_RELEASE_TABLET | Freq: Every day | ORAL | Status: DC
  Administered 2023-02-27: 81 mg via ORAL
  Filled 2023-02-26: qty 1

## 2023-02-26 MED ORDER — PHENYLEPHRINE 80 MCG/ML (10ML) SYRINGE FOR IV PUSH (FOR BLOOD PRESSURE SUPPORT)
PREFILLED_SYRINGE | INTRAVENOUS | Status: DC | PRN
  Administered 2023-02-26 (×2): 40 ug via INTRAVENOUS
  Administered 2023-02-26: 60 ug via INTRAVENOUS

## 2023-02-26 MED ORDER — PROPOFOL 10 MG/ML IV BOLUS
INTRAVENOUS | Status: AC
  Filled 2023-02-26: qty 20

## 2023-02-26 MED ORDER — SUGAMMADEX SODIUM 200 MG/2ML IV SOLN
INTRAVENOUS | Status: DC | PRN
  Administered 2023-02-26: 200 mg via INTRAVENOUS

## 2023-02-26 MED ORDER — LACTATED RINGERS IV SOLN: INTRAVENOUS | Status: DC | PRN

## 2023-02-26 MED ORDER — ONDANSETRON HCL 4 MG/2ML IJ SOLN
INTRAMUSCULAR | Status: AC
  Filled 2023-02-26: qty 2

## 2023-02-26 MED ORDER — MAGNESIUM SULFATE 2 GM/50ML IV SOLN: 2.0000 g | Freq: Every day | INTRAVENOUS | Status: DC | PRN

## 2023-02-26 MED ORDER — SURGIFLO WITH THROMBIN (HEMOSTATIC MATRIX KIT) OPTIME
TOPICAL | Status: DC | PRN
  Administered 2023-02-26: 1 via TOPICAL

## 2023-02-26 MED ORDER — CEFAZOLIN SODIUM 1 G IJ SOLR
INTRAMUSCULAR | Status: AC
  Filled 2023-02-26: qty 20

## 2023-02-26 MED ORDER — LACTATED RINGERS IV SOLN: INTRAVENOUS | Status: DC

## 2023-02-26 MED ORDER — SENNOSIDES-DOCUSATE SODIUM 8.6-50 MG PO TABS: 1.0000 | ORAL_TABLET | Freq: Every evening | ORAL | Status: DC | PRN

## 2023-02-26 MED ORDER — PANTOPRAZOLE SODIUM 40 MG PO TBEC
40.0000 mg | DELAYED_RELEASE_TABLET | Freq: Every day | ORAL | Status: DC
  Administered 2023-02-27: 40 mg via ORAL
  Filled 2023-02-26: qty 1

## 2023-02-26 MED ORDER — PHENYLEPHRINE 80 MCG/ML (10ML) SYRINGE FOR IV PUSH (FOR BLOOD PRESSURE SUPPORT)
PREFILLED_SYRINGE | INTRAVENOUS | Status: AC
  Filled 2023-02-26: qty 10

## 2023-02-26 MED ORDER — POTASSIUM CHLORIDE CRYS ER 20 MEQ PO TBCR: 20.0000 meq | EXTENDED_RELEASE_TABLET | Freq: Every day | ORAL | Status: DC | PRN

## 2023-02-26 MED ORDER — PROTAMINE SULFATE 10 MG/ML IV SOLN
INTRAVENOUS | Status: AC
  Filled 2023-02-26: qty 5

## 2023-02-26 MED ORDER — GUAIFENESIN-DM 100-10 MG/5ML PO SYRP: 15.0000 mL | ORAL_SOLUTION | ORAL | Status: DC | PRN

## 2023-02-26 MED ORDER — ACETAMINOPHEN 650 MG RE SUPP: 325.0000 mg | RECTAL | Status: DC | PRN

## 2023-02-26 MED ORDER — OXYCODONE HCL 5 MG/5ML PO SOLN: 5.0000 mg | Freq: Once | ORAL | Status: DC | PRN

## 2023-02-26 MED ORDER — BISACODYL 5 MG PO TBEC: 5.0000 mg | DELAYED_RELEASE_TABLET | Freq: Every day | ORAL | Status: DC | PRN

## 2023-02-26 MED ORDER — HYDROMORPHONE HCL 1 MG/ML IJ SOLN: 0.5000 mg | INTRAMUSCULAR | Status: DC | PRN

## 2023-02-26 MED ORDER — TRAZODONE HCL 50 MG PO TABS
50.0000 mg | ORAL_TABLET | Freq: Every day | ORAL | Status: DC
  Administered 2023-02-26: 50 mg via ORAL
  Filled 2023-02-26: qty 1

## 2023-02-26 MED ORDER — FENTANYL CITRATE (PF) 250 MCG/5ML IJ SOLN
INTRAMUSCULAR | Status: DC | PRN
  Administered 2023-02-26 (×2): 50 ug via INTRAVENOUS

## 2023-02-26 MED ORDER — 0.9 % SODIUM CHLORIDE (POUR BTL) OPTIME
TOPICAL | Status: DC | PRN
  Administered 2023-02-26: 1000 mL

## 2023-02-26 MED ORDER — HEPARIN SODIUM (PORCINE) 5000 UNIT/ML IJ SOLN
5000.0000 [IU] | Freq: Three times a day (TID) | INTRAMUSCULAR | Status: DC
  Administered 2023-02-27: 5000 [IU] via SUBCUTANEOUS
  Filled 2023-02-26: qty 1

## 2023-02-26 MED ORDER — ONDANSETRON HCL 4 MG/2ML IJ SOLN: 4.0000 mg | Freq: Once | INTRAMUSCULAR | Status: DC | PRN

## 2023-02-26 MED ORDER — ACETAMINOPHEN 325 MG PO TABS: 325.0000 mg | ORAL_TABLET | ORAL | Status: DC | PRN

## 2023-02-26 MED ORDER — VANCOMYCIN HCL IN DEXTROSE 1-5 GM/200ML-% IV SOLN
1000.0000 mg | Freq: Two times a day (BID) | INTRAVENOUS | Status: AC
  Administered 2023-02-26 – 2023-02-27 (×2): 1000 mg via INTRAVENOUS
  Filled 2023-02-26 (×2): qty 200

## 2023-02-26 MED ORDER — PHENOL 1.4 % MT LIQD: 1.0000 | OROMUCOSAL | Status: DC | PRN

## 2023-02-26 MED ORDER — ALBUMIN HUMAN 5 % IV SOLN: 12.5000 g | Freq: Once | INTRAVENOUS | Status: AC

## 2023-02-26 MED ORDER — PHENYLEPHRINE HCL-NACL 20-0.9 MG/250ML-% IV SOLN
INTRAVENOUS | Status: DC | PRN
  Administered 2023-02-26: 40 ug/min via INTRAVENOUS

## 2023-02-26 MED ORDER — FLEET ENEMA 7-19 GM/118ML RE ENEM: 1.0000 | ENEMA | Freq: Once | RECTAL | Status: DC | PRN

## 2023-02-26 MED ORDER — SODIUM CHLORIDE 0.9 % IV SOLN: 0.0000 ug/min | INTRAVENOUS | Status: DC

## 2023-02-26 MED ORDER — ONDANSETRON HCL 4 MG/2ML IJ SOLN
INTRAMUSCULAR | Status: DC | PRN
  Administered 2023-02-26: 4 mg via INTRAVENOUS

## 2023-02-26 MED ORDER — SODIUM CHLORIDE 0.9 % IV SOLN: 500.0000 mL | Freq: Once | INTRAVENOUS | Status: DC | PRN

## 2023-02-26 MED ORDER — ALUM & MAG HYDROXIDE-SIMETH 200-200-20 MG/5ML PO SUSP: 15.0000 mL | ORAL | Status: DC | PRN

## 2023-02-26 MED ORDER — MIDAZOLAM HCL 2 MG/2ML IJ SOLN
INTRAMUSCULAR | Status: DC | PRN
  Administered 2023-02-26: 2 mg via INTRAVENOUS

## 2023-02-26 MED ORDER — PROPOFOL 10 MG/ML IV BOLUS
INTRAVENOUS | Status: DC | PRN
  Administered 2023-02-26: 100 mg via INTRAVENOUS

## 2023-02-26 SURGICAL SUPPLY — 36 items
ADH SKN CLS APL DERMABOND .7 (GAUZE/BANDAGES/DRESSINGS) ×1
AGENT HMST MTR 8 SURGIFLO (HEMOSTASIS) ×1
BAG COUNTER SPONGE SURGICOUNT (BAG) ×2 IMPLANT
BAG SPNG CNTER NS LX DISP (BAG) ×1
CANISTER SUCT 3000ML PPV (MISCELLANEOUS) ×2 IMPLANT
CATH ROBINSON RED A/P 18FR (CATHETERS) ×2 IMPLANT
CATH SUCT 10FR WHISTLE TIP (CATHETERS) ×2 IMPLANT
CLIP TI MEDIUM 6 (CLIP) ×2 IMPLANT
CLIP TI WIDE RED SMALL 6 (CLIP) ×2 IMPLANT
COVER PROBE W GEL 5X96 (DRAPES) IMPLANT
DERMABOND ADVANCED .7 DNX12 (GAUZE/BANDAGES/DRESSINGS) ×2 IMPLANT
ELECT REM PT RETURN 9FT ADLT (ELECTROSURGICAL) ×1
ELECTRODE REM PT RTRN 9FT ADLT (ELECTROSURGICAL) ×2 IMPLANT
GLOVE SURG SS PI 7.5 STRL IVOR (GLOVE) ×6 IMPLANT
GOWN STRL REUS W/ TWL LRG LVL3 (GOWN DISPOSABLE) ×4 IMPLANT
GOWN STRL REUS W/ TWL XL LVL3 (GOWN DISPOSABLE) ×2 IMPLANT
GOWN STRL REUS W/TWL LRG LVL3 (GOWN DISPOSABLE) ×2
GOWN STRL REUS W/TWL XL LVL3 (GOWN DISPOSABLE) ×1
HEMOSTAT SNOW SURGICEL 2X4 (HEMOSTASIS) IMPLANT
KIT BASIN OR (CUSTOM PROCEDURE TRAY) ×2 IMPLANT
KIT SHUNT ARGYLE CAROTID ART 6 (VASCULAR PRODUCTS) IMPLANT
KIT TURNOVER KIT B (KITS) ×2 IMPLANT
NS IRRIG 1000ML POUR BTL (IV SOLUTION) ×6 IMPLANT
PACK CAROTID (CUSTOM PROCEDURE TRAY) ×2 IMPLANT
PAD ARMBOARD 7.5X6 YLW CONV (MISCELLANEOUS) ×4 IMPLANT
PATCH VASC XENOSURE 1CMX6CM (Vascular Products) ×1 IMPLANT
PATCH VASC XENOSURE 1X6 (Vascular Products) IMPLANT
POSITIONER HEAD DONUT 9IN (MISCELLANEOUS) ×2 IMPLANT
SPONGE SURGIFLO 8M (HEMOSTASIS) IMPLANT
SUT PROLENE 6 0 BV (SUTURE) ×4 IMPLANT
SUT VIC AB 3-0 SH 27 (SUTURE) ×2
SUT VIC AB 3-0 SH 27X BRD (SUTURE) ×4 IMPLANT
SUT VIC AB 3-0 X1 27 (SUTURE) ×2 IMPLANT
SYR CONTROL 10ML LL (SYRINGE) IMPLANT
TOWEL GREEN STERILE (TOWEL DISPOSABLE) ×2 IMPLANT
WATER STERILE IRR 1000ML POUR (IV SOLUTION) ×2 IMPLANT

## 2023-02-26 NOTE — H&P (Signed)
 [facility] [place]  Patient name: Alyssa Ritter MRN: [id number] DOB: 11/25/1960 Sex: female   REASON FOR VISIT:    Follow up  HISOTRY OF PRESENT ILLNESS:   Alyssa Ritter is a 61 y.o. (12/10/1960) female who presents for surveillance of artery stenosis.  She had an episode of right leg numbness and weakness in March 2022.  Workup was negative for stroke.  Carotid duplex demonstrated left-sided 60 to 79% stenosis however CTA of the neck showed only minimal stenosis less than 50% thus no intervention was undertaken.  She has been seen every 6 months since that time.  She has not had any recurrent symptoms.  Carotid stenosis has been estimated to be between 60 and 79% stenosis by duplex.  She is taking her aspirin and Plavix daily.  She is on Repatha due to a statin allergy.  She is a former smoker.   She recently underwent carotid angiography by neuroradiology which indicated a greater than 90% left carotid stenosis.  With the exception of her headache, she is asymptomatic.  She denies numbness or weakness in extremity.  She had no slurred speech.  She denies emesis fugax.  She does not endorse any upper extremity claudication.  She sees Dr. Munley for coronary disease.  She is previously undergone stenting.   PAST MEDICAL HISTORY:   Past Medical History:  Diagnosis Date   Abnormal cardiac CT angiography 09/06/2020   Anxiety disorder    Benign essential tremor    CAD (coronary artery disease)    Carotid artery occlusion    Constipation    Coronary artery disease involving native coronary artery of native heart with angina pectoris (HCC)    Depression with anxiety    Hematuria    Hypercholesteremia    Peripheral vascular disease of extremity (HCC)    Progressive angina (HCC) 09/06/2020   TIA (transient ischemic attack) 11/08/2020   Weight loss, abnormal      FAMILY HISTORY:   Family History  Problem Relation  Age of Onset   Colon polyps Mother    Hypertension Mother    Hypercholesterolemia Mother    Arthritis Mother    Seizures Mother    Hypertension Father    CAD Father        died of complications of AML   Hypercholesterolemia Father    Thyroid disease Sister    Arthritis Sister    Hypertension Brother    Stroke Brother    Colon cancer Neg Hx    Breast cancer Neg Hx     SOCIAL HISTORY:   Social History   Tobacco Use   Smoking status: Former    Types: Cigarettes    Quit date: 01/2018    Years since quitting: 4.9    Passive exposure: Current   Smokeless tobacco: Former    Quit date: 08/31/2017  Substance Use Topics   Alcohol use: Yes    Comment: occasionally     ALLERGIES:   Allergies  Allergen Reactions   Penicillins Anaphylaxis   Statins     Kidney Injury     CURRENT MEDICATIONS:   Current Outpatient Medications  Medication Sig Dispense Refill   aspirin EC 81 MG tablet Take 81 mg by mouth daily. Swallow whole.     clonazePAM (KLONOPIN) 1 MG tablet Take 0.5-1 mg by mouth 2 (two) times daily as needed for anxiety.     clopidogrel (PLAVIX) 75 MG tablet TAKE 1 TABLET(75 MG) BY MOUTH DAILY 90 tablet   1   diltiazem (CARDIZEM CD) 180 MG 24 hr capsule TAKE 1 CAPSULE(180 MG) BY MOUTH DAILY 90 capsule 2   LINZESS 145 MCG CAPS capsule Take 145 mcg by mouth daily.     nitroGLYCERIN (NITROSTAT) 0.4 MG SL tablet Place 1 tablet (0.4 mg total) under the tongue every 5 (five) minutes as needed for chest pain. 30 tablet 0   REPATHA SURECLICK 140 MG/ML SOAJ ADMINISTER 1 ML UNDER THE SKIN EVERY 14 DAYS 2 mL 12   Study - [name] - olpasiran (AMG 890) 142 mg/mL or placebo SQ injection (PI-Hilty) Inject 142 mg into the skin once. For Investigational Use Only. Inject 1 mL (1 prefilled syringe) subcutaneously into appropriate injection site per protocol every 12 weeks. (Approved injection site(s): upper arm, upper thigh & abdomen). Please contact [facility] for any  questions or concerns regarding this medication.     traZODone (DESYREL) 50 MG tablet Take 50 mg by mouth at bedtime.     No current facility-administered medications for this visit.    REVIEW OF SYSTEMS:   [X] denotes positive finding, [ ] denotes negative finding Cardiac  Comments:  Chest pain or chest pressure:    Shortness of breath upon exertion:    Short of breath when lying flat:    Irregular heart rhythm:        Vascular    Pain in calf, thigh, or hip brought on by ambulation:    Pain in feet at night that wakes you up from your sleep:     Blood clot in your veins:    Leg swelling:         Pulmonary    Oxygen at home:    Productive cough:     Wheezing:         Neurologic    Sudden weakness in arms or legs:     Sudden numbness in arms or legs:     Sudden onset of difficulty speaking or slurred speech:    Temporary loss of vision in one eye:     Problems with dizziness:         Gastrointestinal    Blood in stool:     Vomited blood:         Genitourinary    Burning when urinating:     Blood in urine:        Psychiatric    Major depression:         Hematologic    Bleeding problems:    Problems with blood clotting too easily:        Skin    Rashes or ulcers:        Constitutional    Fever or chills:      PHYSICAL EXAM:   Vitals:   01/18/23 1332 01/18/23 1336  BP: (!) 140/71 (!) 147/82  Pulse: 74 74  Resp: 14   Temp: (!) 97.5 F (36.4 C)   TempSrc: Temporal   SpO2: 98%   Weight: 141 lb (64 kg)   Height: 5' 3" (1.6 m)     GENERAL: The patient is a well-nourished female, in no acute distress. The vital signs are documented above. CARDIAC: There is a regular rate and rhythm.  PULMONARY: Non-labored respirations MUSCULOSKELETAL: There are no major deformities or cyanosis. NEUROLOGIC: No focal weakness or paresthesias are detected. SKIN: There are no ulcers or rashes noted. PSYCHIATRIC: The patient has a normal affect.  STUDIES:   I have  reviewed the following: Carotid angiogram   shows 90% stenosis of the proximal left internal carotid artery.  She has 75% stenosis of the proximal right subclavian artery  MEDICAL ISSUES:   Asymptomatic left carotid stenosis: I have reviewed all of her images including her ultrasounds, CT scans, and angiography.  Stenosis is 90% by angiography.  We discussed proceeding with left carotid endarterectomy.  I discussed the details of the operation including the risks and benefits.  We discussed the risk of stroke as well as nerve injury.  All questions were answered.  I will not stop her aspirin and Plavix.  She is scheduled to see Dr. Munley within the next 2 weeks and so I will make sure that we have his clearance before proceeding.  Right subclavian stenosis: This is asymptomatic.  No intervention is recommended at this time    Wells Alisha Bacus, IV, MD, FACS [facility] [place] Tel [phone] Pager [phone]  

## 2023-02-26 NOTE — Op Note (Signed)
Patient name: Alyssa Ritter MRN: [id number] DOB: 1961-05-15 Sex: female  02/26/2023 Pre-operative Diagnosis: Asymptomatic   left carotid stenosis Post-operative diagnosis:  Same Surgeon:  Durene Cal Assistants:  Adonis Housekeeper, PA Procedure:    left carotid Endarterectomy with bovine pericardial patch angioplasty Anesthesia:  General Blood Loss:  100 cc Specimens:  none  Findings:  90 %stenosis; Thrombus:  none  Indications: This is a 62 year old female with an asymptomatic high-grade left carotid stenosis documented by cerebral angiography.  She comes in today for endarterectomy.  Procedure:  The patient was identified in the holding area and taken to [facility] OR ROOM 11  The patient was then placed supine on the table.   General endotrachial anesthesia was administered.  The patient was prepped and draped in the usual sterile fashion.  A time out was called and antibiotics were administered.  A PA was necessary to expedite the procedure and assist with technical details.  She help with exposure by providing suction and retraction.  She help with the patch angioplasty by following the suture.  The incision was made along the anterior border of the left sternocleidomastoid muscle.  Cautery was used to dissect through the subcutaneous tissue.  The platysma muscle was divided with cautery.  The internal jugular vein was exposed along its anterior medial border.  The common facial vein was exposed and then divided between 2-0 silk ties and metal clips.  The common carotid artery was then circumferentially exposed and encircled with an umbilical tape.  The vagus nerve was identified and protected.  Next sharp dissection was used to expose the external carotid artery and the superior thyroid artery.  The were encircled with a blue vessel loop and a 2-0 silk tie respectively.  Finally, the internal carotid was carefully dissected free.  An umbilical tape was placed around the internal carotid artery  distal to the diseased segment.  The hypoglossal nerve was visualized throughout and protected.  The patient was given systemic heparinization.  A bovine carotid patch was selected and prepared on the back table.  A 10 french shunt was also prepared.  After blood pressure readings were appropriate and the heparin had been given time to circulate, the internal carotid artery was occluded with a baby Gregory clamp.  The external and common carotid arteries were then occluded with vascular clamps and the 2-0 tie tightened on the superior thyroid artery.  A #11 blade was used to make an arteriotomy in the common carotid artery.  This was extended with Potts scissors along the anterior and lateral border of the common and internal carotid artery.  Approximately 90% stenosis was identified.  There was no thrombus identified.  The 10 french shunt was not placed, as there was adequate backbleeding.  A kleiner kuntz elevator was used to perform endarterectomy.  An eversion endarterectomy was performed in the external carotid artery.  A good distal endpoint was obtained in the internal carotid artery.  The specimen was removed and sent to pathology.  Heparinized saline was used to irrigate the endarterectomized field.  All potential embolic debris was removed.  Bovine pericardial patch angioplasty was then performed using a running 6-0 Prolene.  The common internal and external carotid arteries were all appropriately flushed. The artery was again irrigated with heparin saline.  The anastomosis was then secured. The clamp was first released on the external carotid artery followed by the common carotid artery approximately 30 seconds later, bloodflow was reestablish through the internal carotid artery.  Next, a hand-held  Doppler was used to evaluate the signals in the common, external, and internal  carotid arteries, all of which had appropriate signals. I then administered  50 mg protamine. The wound was then irrigated.   After hemostasis was achieved, the carotid sheath was reapproximated with 3-0 Vicryl. The  platysma muscle was reapproximated with running 3-0 Vicryl. The skin  was closed with 4-0 Vicryl. Dermabond was placed on the skin. The  patient was then successfully extubated. Her neurologic exam was  similar to his preprocedural exam. The patient was then taken to recovery room  in stable condition. There were no complications.     Disposition:  To PACU in stable condition.  Relevant Operative Details: Focal bifurcation stenosis approximately 90%.  Endarterectomy was performed.  A shunt was not necessary due to adequate backbleeding.  A bovine pericardial patch was placed  V. Durene Cal, M.D., [facility] [place] Office: [phone] Pager:  [phone]

## 2023-02-26 NOTE — Anesthesia Procedure Notes (Signed)
Arterial Line Insertion Start/End6/28/2024 7:00 AM, 02/26/2023 7:10 AM Performed by: Mayer Camel, CRNA, CRNA  Patient location: Pre-op. Preanesthetic checklist: patient identified, IV checked, site marked, risks and benefits discussed, surgical consent, monitors and equipment checked, pre-op evaluation, timeout performed and anesthesia consent Lidocaine 1% used for infiltration radial was placed Catheter size: 20 G Hand hygiene performed , maximum sterile barriers used  and Seldinger technique used Allen's test indicative of satisfactory collateral circulation Attempts: 2 Procedure performed using ultrasound guided technique. Following insertion, dressing applied. Post procedure assessment: normal and unchanged  Patient tolerated the procedure well with no immediate complications.

## 2023-02-26 NOTE — Progress Notes (Signed)
  Progress Note    02/26/2023 4:53 PM Day of Surgery  Subjective:  no major complaints. Wants to sleep. Has not eaten yet says she has not felt hungry yet. Denies any trouble talking or swallowing   Vitals:   02/26/23 1500 02/26/23 1600  BP: (!) 97/51 (!) 99/52  Pulse: 67 65  Resp: 14 18  Temp:  98 F (36.7 C)  SpO2: 91% 100%   Physical Exam: Cardiac:  regular Lungs:  non labored Incisions:  left neck incision intact, mild swelling, soft without hematoma. Ice pack replaced on neck Extremities:  moving all extremities without deficits Neurologic: alert and oriented. Speech coherent. Smile symmetric  CBC    Component Value Date/Time   WBC 8.5 02/26/2023 1207   RBC 3.83 (L) 02/26/2023 1207   HGB 11.4 (L) 02/26/2023 1207   HGB 14.4 09/02/2020 0000   HCT 33.6 (L) 02/26/2023 1207   HCT 43.5 09/02/2020 0000   PLT 201 02/26/2023 1207   PLT 273 09/02/2020 0000   MCV 87.7 02/26/2023 1207   MCV 87 09/02/2020 0000   MCH 29.8 02/26/2023 1207   MCHC 33.9 02/26/2023 1207   RDW 13.5 02/26/2023 1207   RDW 13.8 09/02/2020 0000   LYMPHSABS 3.1 11/08/2020 1713   MONOABS 0.7 11/08/2020 1713   EOSABS 0.3 11/08/2020 1713   BASOSABS 0.1 11/08/2020 1713    BMET    Component Value Date/Time   NA 137 02/17/2023 0848   NA 140 04/02/2022 0939   K 3.5 02/17/2023 0848   CL 105 02/17/2023 0848   CO2 21 (L) 02/17/2023 0848   GLUCOSE 96 02/17/2023 0848   BUN 10 02/17/2023 0848   BUN 17 04/02/2022 0939   CREATININE 0.94 02/26/2023 1207   CALCIUM 9.2 02/17/2023 0848   GFRNONAA >60 02/26/2023 1207   GFRAA 72 10/03/2020 0947    INR    Component Value Date/Time   INR 0.9 02/17/2023 0848     Intake/Output Summary (Last 24 hours) at 02/26/2023 1653 Last data filed at 02/26/2023 1400 Gross per 24 hour  Intake 1790 ml  Output 920 ml  Net 870 ml     Assessment/Plan:  62 y.o. female is s/p Left CEA Day of Surgery   Neurologically intact Left neck incision intact, mild swelling,  soft without hematoma BP soft on cuff. A-line better. Asymptomatic Routine post op care Anticipate d/c home tomorrow    Graceann Congress, [place] [facility] [phone] 02/26/2023 4:53 PM

## 2023-02-26 NOTE — Transfer of Care (Signed)
Immediate Anesthesia Transfer of Care Note  Patient: Alyssa Ritter  Procedure(s) Performed: ENDARTERECTOMY CAROTID WITH Livia Snellen PATCH ANGIOPLASTY (Left: Neck)  Patient Location: PACU  Anesthesia Type:General  Level of Consciousness: awake, alert , and oriented  Airway & Oxygen Therapy: Patient Spontanous Breathing and Patient connected to face mask oxygen  Post-op Assessment: Report given to RN and Post -op Vital signs reviewed and stable  Post vital signs: Reviewed and stable  Last Vitals:  Vitals Value Taken Time  BP 96/57 02/26/23 1100  Temp 36.1 C 02/26/23 1018  Pulse 61 02/26/23 1103  Resp 13 02/26/23 1103  SpO2 97 % 02/26/23 1103  Vitals shown include unvalidated device data.  Last Pain:  Vitals:   02/26/23 1045  TempSrc:   PainSc: 0-No pain         Complications: No notable events documented.

## 2023-02-26 NOTE — Anesthesia Postprocedure Evaluation (Signed)
Anesthesia Post Note  Patient: Alyssa Ritter  Procedure(s) Performed: ENDARTERECTOMY CAROTID WITH XENOSURE PATCH ANGIOPLASTY (Left: Neck)     Patient location during evaluation: PACU Anesthesia Type: General Level of consciousness: awake and alert Pain management: pain level controlled Vital Signs Assessment: post-procedure vital signs reviewed and stable Respiratory status: spontaneous breathing, nonlabored ventilation, respiratory function stable and patient connected to nasal cannula oxygen Cardiovascular status: blood pressure returned to baseline and stable Postop Assessment: no apparent nausea or vomiting Anesthetic complications: no  No notable events documented.  Last Vitals:  Vitals:   02/26/23 1100 02/26/23 1115  BP: (!) 96/57 (!) 102/52  Pulse: (!) 59 63  Resp: 12 15  Temp:    SpO2: 99% 98%    Last Pain:  Vitals:   02/26/23 1115  TempSrc:   PainSc: 0-No pain    LLE Motor Response: Purposeful movement;Responds to commands (02/26/23 1115) LLE Sensation: Full sensation (02/26/23 1115) RLE Motor Response: Purposeful movement;Responds to commands (02/26/23 1115) RLE Sensation: Full sensation (02/26/23 1115)      Alvah Gilder S

## 2023-02-26 NOTE — Anesthesia Procedure Notes (Addendum)
Procedure Name: Intubation Date/Time: 02/26/2023 7:53 AM  Performed by: Mayer Camel, CRNAPre-anesthesia Checklist: Patient identified, Emergency Drugs available, Suction available and Patient being monitored Patient Re-evaluated:Patient Re-evaluated prior to induction Oxygen Delivery Method: Circle system utilized Preoxygenation: Pre-oxygenation with 100% oxygen Induction Type: IV induction Ventilation: Mask ventilation without difficulty Laryngoscope Size: Mac and 3 Grade View: Grade I Tube type: Oral Tube size: 7.0 mm Number of attempts: 1 Airway Equipment and Method: Stylet and Oral airway Placement Confirmation: ETT inserted through vocal cords under direct vision, positive ETCO2 and breath sounds checked- equal and bilateral Tube secured with: Tape Dental Injury: Teeth and Oropharynx as per pre-operative assessment

## 2023-02-26 NOTE — Progress Notes (Signed)
Pt arrived to unit from PACU, A/O x 4,  CCMD called ,CHG given, pt oriented to unit,Will continue to monitor. Left neck incision mild swelling MD aware soft to touch   Everlean Cherry, RN   02/26/23 1211  Vitals  Temp 98.6 F (37 C)  Temp Source Oral  BP (!) 92/45  MAP (mmHg) (!) 60  BP Location Right Arm  BP Method Automatic  Patient Position (if appropriate) Lying  Pulse Rate 66  Pulse Rate Source Monitor  ECG Heart Rate 66  Resp 13  Level of Consciousness  Level of Consciousness Alert  Oxygen Therapy  SpO2 94 %  O2 Device Room Air  O2 Flow Rate (L/min) 0 L/min  Pain Assessment  Pain Scale 0-10  Pain Score 0  MEWS Score  MEWS Temp 0  MEWS Systolic 1  MEWS Pulse 0  MEWS RR 1  MEWS LOC 0  MEWS Score 2  MEWS Score Color Yellow

## 2023-02-27 LAB — CBC
HCT: 35 % — ABNORMAL LOW (ref 36.0–46.0)
Hemoglobin: 11.3 g/dL — ABNORMAL LOW (ref 12.0–15.0)
MCH: 29 pg (ref 26.0–34.0)
MCHC: 32.3 g/dL (ref 30.0–36.0)
MCV: 89.7 fL (ref 80.0–100.0)
Platelets: 236 10*3/uL (ref 150–400)
RBC: 3.9 MIL/uL (ref 3.87–5.11)
RDW: 13.8 % (ref 11.5–15.5)
WBC: 12.6 10*3/uL — ABNORMAL HIGH (ref 4.0–10.5)
nRBC: 0 % (ref 0.0–0.2)

## 2023-02-27 LAB — BASIC METABOLIC PANEL
Anion gap: 9 (ref 5–15)
BUN: 14 mg/dL (ref 6–20)
CO2: 20 mmol/L — ABNORMAL LOW (ref 22–32)
Calcium: 8.1 mg/dL — ABNORMAL LOW (ref 8.9–10.3)
Chloride: 108 mmol/L (ref 98–111)
Creatinine, Ser: 1.1 mg/dL — ABNORMAL HIGH (ref 0.44–1.00)
GFR, Estimated: 58 mL/min — ABNORMAL LOW (ref >60)
Glucose, Bld: 149 mg/dL — ABNORMAL HIGH (ref 70–99)
Potassium: 4.1 mmol/L (ref 3.5–5.1)
Sodium: 137 mmol/L (ref 135–145)

## 2023-02-27 LAB — LIPID PANEL
Cholesterol: 119 mg/dL (ref 0–200)
HDL: 51 mg/dL (ref >40)
LDL Cholesterol: 45 mg/dL (ref 0–99)
Total CHOL/HDL Ratio: 2.3 RATIO
Triglycerides: 114 mg/dL (ref <150)
VLDL: 23 mg/dL (ref 0–40)

## 2023-02-27 MED ORDER — OXYCODONE-ACETAMINOPHEN 5-325 MG PO TABS: 1.0000 | ORAL_TABLET | Freq: Four times a day (QID) | ORAL | 0 refills | Status: DC | PRN

## 2023-02-27 NOTE — Discharge Instructions (Signed)
   [facility] [place]  Discharge Instructions   Carotid Surgery  Please refer to the following instructions for your post-procedure care. Your surgeon or physician assistant will discuss any changes with you.  Activity  You are encouraged to walk as much as you can. You can slowly return to normal activities but must avoid strenuous activity and heavy lifting until your doctor tell you it's okay. Avoid activities such as vacuuming or swinging a golf club. You can drive after one week if you are comfortable and you are no longer taking prescription pain medications. It is normal to feel tired for serval weeks after your surgery. It is also normal to have difficulty with sleep habits, eating, and bowel movements after surgery. These will go away with time.  Bathing/Showering  Shower daily after you go home. Do not soak in a bathtub, hot tub, or swim until the incision heals completely.  Incision Care  Shower every day. Clean your incision with mild soap and water. Pat the area dry with a clean towel. You do not need a bandage unless otherwise instructed. Do not apply any ointments or creams to your incision. You may have skin glue on your incision. Do not peel it off. It will come off on its own in about one week. Your incision may feel thickened and raised for several weeks after your surgery. This is normal and the skin will soften over time.   For Men Only: It's okay to shave around the incision but do not shave the incision itself for 2 weeks. It is common to have numbness under your chin that could last for several months.  Diet  Resume your normal diet. There are no special food restrictions following this procedure. A low fat/low cholesterol diet is recommended for all patients with vascular disease. In order to heal from your surgery, it is CRITICAL to get adequate nutrition. Your body requires vitamins, minerals, and protein. Vegetables are the best source of  vitamins and minerals. Vegetables also provide the perfect balance of protein. Processed food has little nutritional value, so try to avoid this.  Medications  Resume taking all of your medications unless your doctor or physician assistant tells you not to. If your incision is causing pain, you may take over-the- counter pain relievers such as acetaminophen (Tylenol). If you were prescribed a stronger pain medication, please be aware these medications can cause nausea and constipation. Prevent nausea by taking the medication with a snack or meal. Avoid constipation by drinking plenty of fluids and eating foods with a high amount of fiber, such as fruits, vegetables, and grains.   Do not take Tylenol if you are taking prescription pain medications.  Follow Up  Our office will schedule a follow up appointment 2-3 weeks following discharge.  Please call us immediately for any of the following conditions  . Increased pain, redness, drainage (pus) from your incision site. . Fever of 101 degrees or higher. . If you should develop stroke (slurred speech, difficulty swallowing, weakness on one side of your body, loss of vision) you should call 911 and go to the nearest emergency room. .  Reduce your risk of vascular disease:  . Stop smoking. If you would like help call [facility] at 1-800-QUIT-NOW (1-800-784-8669) or [place] at 336-586-4000. . Manage your cholesterol . Maintain a desired weight . Control your diabetes . Keep your blood pressure down .  If you have any questions, please call the office at [phone]. 

## 2023-02-27 NOTE — Progress Notes (Addendum)
  Progress Note    02/27/2023 8:22 AM 1 Day Post-Op  Subjective:  no complaints   Vitals:   02/27/23 0500 02/27/23 0804  BP: (!) 100/48   Pulse: (!) 55 63  Resp: 16 20  Temp:    SpO2: 92% 94%   Physical Exam: Cardiac:  regular Lungs:  non labored Incisions:  left neck incision is intact and well appearing, without hematoma Extremities:  moving all extremities without deficits Neurologic: alert and oriented. Speech coherent. Tongue midline. Smile symmetric  CBC    Component Value Date/Time   WBC 12.6 (H) 02/27/2023 0540   RBC 3.90 02/27/2023 0540   HGB 11.3 (L) 02/27/2023 0540   HGB 14.4 09/02/2020 0000   HCT 35.0 (L) 02/27/2023 0540   HCT 43.5 09/02/2020 0000   PLT 236 02/27/2023 0540   PLT 273 09/02/2020 0000   MCV 89.7 02/27/2023 0540   MCV 87 09/02/2020 0000   MCH 29.0 02/27/2023 0540   MCHC 32.3 02/27/2023 0540   RDW 13.8 02/27/2023 0540   RDW 13.8 09/02/2020 0000   LYMPHSABS 3.1 11/08/2020 1713   MONOABS 0.7 11/08/2020 1713   EOSABS 0.3 11/08/2020 1713   BASOSABS 0.1 11/08/2020 1713    BMET    Component Value Date/Time   NA 137 02/27/2023 0540   NA 140 04/02/2022 0939   K 4.1 02/27/2023 0540   CL 108 02/27/2023 0540   CO2 20 (L) 02/27/2023 0540   GLUCOSE 149 (H) 02/27/2023 0540   BUN 14 02/27/2023 0540   BUN 17 04/02/2022 0939   CREATININE 1.10 (H) 02/27/2023 0540   CALCIUM 8.1 (L) 02/27/2023 0540   GFRNONAA 58 (L) 02/27/2023 0540   GFRAA 72 10/03/2020 0947    INR    Component Value Date/Time   INR 0.9 02/17/2023 0848     Intake/Output Summary (Last 24 hours) at 02/27/2023 4098 Last data filed at 02/27/2023 0600 Gross per 24 hour  Intake 2674.83 ml  Output 2820 ml  Net -145.17 ml     Assessment/Plan:  62 y.o. female is s/p left CEA 1 Day Post-Op   Neurologically intact Left neck incision is well appearing, soft, without hematoma Tolerating diet VSS She is stable for d/c home today She will d/c on Aspirin, statin and Plavix   Want her to ambulate in hall before d/c She will have follow up arranged in 2-3 weeks for incision check   Graceann Congress, PA-C [facility] [phone] 02/27/2023 8:22 AM  I agree with the above.  Have seen and evaluated the patient.  She is postop day 1 from a left carotid endarterectomy.  She is neurologically intact.  She is tolerating her diet.  She has voided.  As soon as she ambulates, she will be able to go home  [employer]

## 2023-02-27 NOTE — Progress Notes (Signed)
Mobility Specialist: Progress Note   02/27/23 0942  Mobility  Activity Ambulated independently in hallway  Level of Assistance Independent  Assistive Device None  Distance Ambulated (ft) 430 ft  Activity Response Tolerated well  Mobility Referral Yes  $Mobility charge 1 Mobility  Mobility Specialist Start Time (ACUTE ONLY) [id number]  Mobility Specialist Stop Time (ACUTE ONLY) 0941  Mobility Specialist Time Calculation (min) (ACUTE ONLY) 14 min   Pre-Mobility: 65 HR, 92% SpO2 During Mobility: 116 HR Post-Mobility: 83 HR, 98% SpO2  Received pt in bed having no complaints and agreeable to mobility. Pt was asymptomatic throughout ambulation and returned to room w/o fault. To BR per request and then to bed w/ call bell in reach and all needs met.  Alyssa Ritter Mobility Specialist Please contact via SecureChat or Rehab office at [phone]

## 2023-02-28 ENCOUNTER — Encounter (HOSPITAL_COMMUNITY): Payer: Self-pay | Admitting: Surgery

## 2023-02-28 NOTE — Discharge Summary (Signed)
Carotid Discharge Summary     Alyssa Ritter Dec 11, 1960 62 y.o. female  [id number]  Admission Date: 02/26/2023  Discharge Date: 02/27/2023  Physician: Dr. Venida Jarvis  Admission Diagnosis: Carotid stenosis, asymptomatic, left [I65.22]  Discharge Day services:    Mobility specialist  Hospital Course:  The patient was admitted to the hospital and taken to the operating room on 02/26/2023 and underwent left carotid endarterectomy.  The pt tolerated the procedure well and was transported to the PACU in good condition.  By POD 1, the pt neuro status remained intact. Left neck incision well appearing, soft and without swelling or hematoma. Hemodynamically stable.   The remainder of the hospital course consisted of increasing mobilization and increasing intake of solids without difficulty.  She remained stable for discharge home. She will discharge on Aspirin, statin and Plavix.she will resume her other home medications as prescribed. PDMP was reviewed and post operative pain medication was sent to her pharmacy. She has follow up arranged in 2-3 weeks for incision check.    Recent Labs    02/27/23 0540  NA 137  K 4.1  CL 108  CO2 20*  GLUCOSE 149*  BUN 14  CALCIUM 8.1*   Recent Labs    02/26/23 1207 02/27/23 0540  WBC 8.5 12.6*  HGB 11.4* 11.3*  HCT 33.6* 35.0*  PLT 201 236   No results for input(s): "INR" in the last 72 hours.   Discharge Instructions     CAROTID Sugery: Call MD for difficulty swallowing or speaking; weakness in arms or legs that is a new symtom; severe headache.  If you have increased swelling in the neck and/or  are having difficulty breathing, CALL 911   Complete by: As directed    Call MD for:  redness, tenderness, or signs of infection (pain, swelling, bleeding, redness, odor or green/yellow discharge around incision site)   Complete by: As directed    Call MD for:  severe or increased pain, loss or decreased feeling  in  affected limb(s)   Complete by: As directed    Call MD for:  temperature >100.5   Complete by: As directed    Discharge wound care:   Complete by: As directed    Keep incision dry for 24 hours. You can then shower as normal. Allow mild soap and water to run over incision, pat dry. Do not apply any topical ointments, lotions, etc. Do not soak in bathtub or pool   Driving Restrictions   Complete by: As directed    No driving for 2 weeks or while taking narcotic pain medications   Increase activity slowly   Complete by: As directed    Walk with assistance use walker or cane as needed   Lifting restrictions   Complete by: As directed    No lifting for 4 weeks   Resume previous diet   Complete by: As directed        Discharge Diagnosis:  Carotid stenosis, asymptomatic, left [I65.22]  Secondary Diagnosis: Patient Active Problem List   Diagnosis Date Noted   Carotid stenosis, asymptomatic, left 02/26/2023   Carotid artery occlusion 03/19/2021   TIA (transient ischemic attack) 11/08/2020   CAD (coronary artery disease)    Abnormal cardiac CT angiography 09/06/2020   Progressive angina (HCC) 09/06/2020   Weight loss, abnormal    Peripheral vascular disease of extremity (HCC)    Hypercholesteremia    Hematuria    Depression with anxiety    Constipation  Coronary artery disease involving native coronary artery of native heart with angina pectoris (HCC)    Benign essential tremor    Anxiety disorder    Past Medical History:  Diagnosis Date   Abnormal cardiac CT angiography 09/06/2020   Anxiety disorder    Benign essential tremor    CAD (coronary artery disease)    Carotid artery occlusion 11/09/2020   Complication of anesthesia    Hard time waking up and BP drops   Constipation    Coronary artery disease involving native coronary artery of native heart with angina pectoris (HCC)    Depression    Family history of adverse reaction to anesthesia    Mother had same  reaction to anesthesia   Headache    Hx Migraines   Hematuria    History of kidney stones    Hypercholesteremia    Peripheral vascular disease (HCC)    Left Carotid Stenosis   Peripheral vascular disease of extremity (HCC)    PONV (postoperative nausea and vomiting)    Progressive angina (HCC) 09/06/2020   Stroke (HCC) 10/2020   TIA   TIA (transient ischemic attack) 11/08/2020   Weight loss, abnormal     Allergies as of 02/27/2023       Reactions   Penicillins Anaphylaxis   Statins    Kidney Injury        Medication List     TAKE these medications    aspirin EC 81 MG tablet Take 81 mg by mouth daily. Swallow whole.   clonazePAM 1 MG tablet Commonly known as: KLONOPIN Take 0.5-1 mg by mouth 2 (two) times daily as needed for anxiety.   clopidogrel 75 MG tablet Commonly known as: PLAVIX TAKE 1 TABLET(75 MG) BY MOUTH DAILY   diltiazem 180 MG 24 hr capsule Commonly known as: CARDIZEM CD TAKE 1 CAPSULE(180 MG) BY MOUTH DAILY   Linzess 145 MCG Caps capsule Generic drug: linaclotide Take 145 mcg by mouth daily as needed (Constipation).   nitroGLYCERIN 0.4 MG SL tablet Commonly known as: NITROSTAT Place 1 tablet (0.4 mg total) under the tongue every 5 (five) minutes as needed for chest pain.   [name] olpasiran (AMG 890) or placebo 142 mg/mL SQ injection Inject 142 mg into the skin once. For Investigational Use Only. Inject 1 mL (1 prefilled syringe) subcutaneously into appropriate injection site per protocol every 12 weeks. (Approved injection site(s): upper arm, upper thigh & abdomen). Please contact [facility] for any questions or concerns regarding this medication.   oxyCODONE-acetaminophen 5-325 MG tablet Commonly known as: PERCOCET/ROXICET Take 1 tablet by mouth every 6 (six) hours as needed for moderate pain.   Repatha SureClick 140 MG/ML Soaj Generic drug: Evolocumab ADMINISTER 1 ML UNDER THE SKIN EVERY 14 DAYS   traZODone 50 MG  tablet Commonly known as: DESYREL Take 50 mg by mouth at bedtime.               Discharge Care Instructions  (From admission, onward)           Start     Ordered   02/27/23 0000  Discharge wound care:       Comments: Keep incision dry for 24 hours. You can then shower as normal. Allow mild soap and water to run over incision, pat dry. Do not apply any topical ointments, lotions, etc. Do not soak in bathtub or pool   02/27/23 0828             Discharge Instructions:  [facility] Discharge Instructions Carotid Endarterectomy (CEA)  Please refer to the following instructions for your post-procedure care. Your surgeon or physician assistant will discuss any changes with you.  Activity  You are encouraged to walk as much as you can. You can slowly return to normal activities but must avoid strenuous activity and heavy lifting until your doctor tell you it's OK. Avoid activities such as vacuuming or swinging a golf club. You can drive after one week if you are comfortable and you are no longer taking prescription pain medications. It is normal to feel tired for serval weeks after your surgery. It is also normal to have difficulty with sleep habits, eating, and bowel movements after surgery. These will go away with time.  Bathing/Showering  You may shower after you come home. Do not soak in a bathtub, hot tub, or swim until the incision heals completely.  Incision Care  Shower every day. Clean your incision with mild soap and water. Pat the area dry with a clean towel. You do not need a bandage unless otherwise instructed. Do not apply any ointments or creams to your incision. You may have skin glue on your incision. Do not peel it off. It will come off on its own in about one week. Your incision may feel thickened and raised for several weeks after your surgery. This is normal and the skin will soften over time. For Men Only: It's OK to  shave around the incision but do not shave the incision itself for 2 weeks. It is common to have numbness under your chin that could last for several months.  Diet  Resume your normal diet. There are no special food restrictions following this procedure. A low fat/low cholesterol diet is recommended for all patients with vascular disease. In order to heal from your surgery, it is CRITICAL to get adequate nutrition. Your body requires vitamins, minerals, and protein. Vegetables are the best source of vitamins and minerals. Vegetables also provide the perfect balance of protein. Processed food has little nutritional value, so try to avoid this.  Medications  Resume taking all of your medications unless your doctor or physician assistant tells you not to.  If your incision is causing pain, you may take over-the- counter pain relievers such as acetaminophen (Tylenol). If you were prescribed a stronger pain medication, please be aware these medications can cause nausea and constipation.  Prevent nausea by taking the medication with a snack or meal. Avoid constipation by drinking plenty of fluids and eating foods with a high amount of fiber, such as fruits, vegetables, and grains. Do not take Tylenol if you are taking prescription pain medications.  Follow Up  Our office will schedule a follow up appointment 2-3 weeks following discharge.  Please call us immediately for any of the following conditions  Increased pain, redness, drainage (pus) from your incision site. Fever of 101 degrees or higher. If you should develop stroke (slurred speech, difficulty swallowing, weakness on one side of your body, loss of vision) you should call 911 and go to the nearest emergency room.  Reduce your risk of vascular disease:  Stop smoking. If you would like help call [facility] at 1-800-QUIT-NOW (712-056-7951) or [place] at [phone]. Manage your cholesterol Maintain a desired weight Control your  diabetes Keep your blood pressure down  If you have any questions, please call the office at [phone].  Prescriptions given: Oxycodone-Acetaminophen 5-325 mg #16 No Refill  Disposition: Home  Patient's condition:  is Excellent  Follow up: 1. Dr. Myra Gianotti in 2 -3 weeks.   Michael Ventresca PA-C [facility] [phone]   --- For [facility] Registry use ---   Modified Rankin score at D/C (0-6): 0  IV medication needed for:  1. Hypertension: No 2. Hypotension: No  Post-op Complications: No  1. Post-op CVA or TIA: No  If yes: Event classification (right eye, left eye, right cortical, left cortical, verterobasilar, other): n/a  If yes: Timing of event (intra-op, <6 hrs post-op, >=6 hrs post-op, unknown): n/a  2. CN injury: No  If yes: CN not injuried   3. Myocardial infarction: No  If yes: Dx by (EKG or clinical, Troponin): n/a  4.  CHF: No  5.  Dysrhythmia (new): No  6. Wound infection: No  7. Reperfusion symptoms: No  8. Return to OR: No  If yes: return to OR for (bleeding, neurologic, other CEA incision, other): n/a  Discharge medications: Statin use:  Yes ASA use:  Yes   Beta blocker use:  No ACE-Inhibitor use:  No  ARB use:  No CCB use: Yes P2Y12 Antagonist use: Yes, [ X] Plavix, [ ]  Plasugrel, [ ]  Ticlopinine, [ ]  Ticagrelor, [ ]  Other, [ ]  No for medical reason, [ ]  Non-compliant, [ ]  Not-indicated Anti-coagulant use:  No, [ ]  Warfarin, [ ]  Rivaroxaban, [ ]  Dabigatran,

## 2023-03-25 NOTE — Progress Notes (Signed)
  POST OPERATIVE OFFICE NOTE    CC:  F/u for surgery  HPI:  This is a 62 y.o. female who is s/p left CEA for asymptomatic carotid artery stenosis on 02/26/2023 by Dr. Myra Gianotti.  The right ICA stenosis on ultrasound was 1-39% in April 2024.   She is on asa/statin/plavix.  Pt returns today for follow up.  Pt states she had swelling around the incision post operatively but this has improved significantly.  She denies any speech difficulties, one sided weakness, numbness, paralysis or visual changes.  She is compliant with her asa/statin/plavix.     Allergies  Allergen Reactions   Penicillins Anaphylaxis   Statins     Kidney Injury    Current Outpatient Medications  Medication Sig Dispense Refill   aspirin EC 81 MG tablet Take 81 mg by mouth daily. Swallow whole.     clonazePAM (KLONOPIN) 1 MG tablet Take 0.5-1 mg by mouth 2 (two) times daily as needed for anxiety.     clopidogrel (PLAVIX) 75 MG tablet TAKE 1 TABLET(75 MG) BY MOUTH DAILY 90 tablet 1   diltiazem (CARDIZEM CD) 180 MG 24 hr capsule TAKE 1 CAPSULE(180 MG) BY MOUTH DAILY 90 capsule 2   LINZESS 145 MCG CAPS capsule Take 145 mcg by mouth daily as needed (Constipation).     nitroGLYCERIN (NITROSTAT) 0.4 MG SL tablet Place 1 tablet (0.4 mg total) under the tongue every 5 (five) minutes as needed for chest pain. 30 tablet 0   oxyCODONE-acetaminophen (PERCOCET/ROXICET) 5-325 MG tablet Take 1 tablet by mouth every 6 (six) hours as needed for moderate pain. 16 tablet 0   REPATHA SURECLICK 140 MG/ML SOAJ ADMINISTER 1 ML UNDER THE SKIN EVERY 14 DAYS 2 mL 12   Study - [name] - olpasiran (AMG 890) 142 mg/mL or placebo SQ injection (PI-Hilty) Inject 142 mg into the skin once. For Investigational Use Only. Inject 1 mL (1 prefilled syringe) subcutaneously into appropriate injection site per protocol every 12 weeks. (Approved injection site(s): upper arm, upper thigh & abdomen). Please contact [facility] for any questions or  concerns regarding this medication.     traZODone (DESYREL) 50 MG tablet Take 50 mg by mouth at bedtime.     No current facility-administered medications for this visit.     ROS:  See HPI  Physical Exam:  Today's Vitals   03/29/23 1049 03/29/23 1051  BP: (!) 157/77 (!) 164/79  Pulse: 74   Resp: 16   Temp: 97.9 F (36.6 C)   TempSrc: Temporal   SpO2: 99%   Weight: 144 lb (65.3 kg)   Height: 5\' 3"  (1.6 m)    Body mass index is 25.51 kg/m.   Incision:  healing nicely Neuro: moving all extremities normally and equally     Assessment/Plan:  This is a 62 y.o. female who is s/p: left CEA for asymptomatic carotid artery stenosis on 02/26/2023 by Dr. Myra Gianotti.  The right ICA stenosis on ultrasound was 1-39% in April 2024.    -pt doing well s/p left CEA and no neurological issues -continue asa/statin/plavix -f/u in 9 months with carotid duplex.  She will see Dr. Myra Gianotti at that time since he is not in the office today.  She is in agreement with this plan.   Doreatha Massed, [facility] [phone]   Clinic MD:  Lenell Antu

## 2023-03-29 ENCOUNTER — Ambulatory Visit (INDEPENDENT_AMBULATORY_CARE_PROVIDER_SITE_OTHER): Payer: Medicaid Other | Admitting: Physician Assistant

## 2023-03-29 VITALS — BP 164/79 | HR 74 | Temp 97.9°F | Resp 16 | Ht 63.0 in | Wt 144.0 lb

## 2023-03-29 DIAGNOSIS — Z9889 Other specified postprocedural states: Secondary | ICD-10-CM

## 2023-04-02 ENCOUNTER — Other Ambulatory Visit: Payer: Self-pay

## 2023-04-02 DIAGNOSIS — I6523 Occlusion and stenosis of bilateral carotid arteries: Secondary | ICD-10-CM

## 2023-04-16 ENCOUNTER — Other Ambulatory Visit: Payer: Self-pay | Admitting: Cardiology

## 2023-04-27 ENCOUNTER — Other Ambulatory Visit: Payer: Self-pay

## 2023-04-27 ENCOUNTER — Encounter: Payer: Medicaid Other | Admitting: *Deleted

## 2023-04-27 DIAGNOSIS — Z006 Encounter for examination for normal comparison and control in clinical research program: Secondary | ICD-10-CM

## 2023-04-27 MED ORDER — STUDY - OCEAN(A) - OLPASIRAN (AMG 890) 142 MG/ML OR PLACEBO SQ INJECTION (PI-HILTY)
142.0000 mg | PREFILLED_SYRINGE | Freq: Once | SUBCUTANEOUS | Status: AC
  Administered 2023-04-27: 142 mg via SUBCUTANEOUS
  Filled 2023-04-27: qty 1

## 2023-04-27 NOTE — Research (Addendum)
Ocean A  Week 36  Vitals: [x]  Experience any AE/SAE/Hospitalizations [x]  Yes []  No  If yes please explain: had left carotid surgery 02-26-2023 Labs collected:  0914 Discussed with patient about the importance of not letting any one draw cholesterol or lipids. As to we are blinded to results. Reassured patient if we needed to be contacted the study team would reach out to our unblinded person.    Non-Fatal Potential Endpoint Assessment Yes  No   Has the subject experienced/undergone any of the following since the last visit/contact?   []   [x]    Any Coronary Artery Revascularization/Cerebrovascular Revascularization/ Peripheral Artery Revascularization/Amputation Procedure   [x]   [x]    Myocardial Infarction []   [x]    Stroke   []   [x]    Provide the date for the non-fatal Potential Endpoints status:   []   []     IP admin please see MAR (please add Box # to comment section on MAR) [x]   Alyssa Ritter is here for Week 36 of Ocean a. She reports no ed or urgent care visits, but did have surgery on her left carotid 02-26-23. No longer takes percocet, but all other meds are the same. No abd pain, or any S&S of pancreatitis since last seen. Vs taken at 0902.Blood drawn at 0914. Injection was given at 0943 in right arm,tol well,Box number [id number].   Current Outpatient Medications:    aspirin EC 81 MG tablet, Take 81 mg by mouth daily. Swallow whole., Disp: , Rfl:    clonazePAM (KLONOPIN) 1 MG tablet, Take 0.5-1 mg by mouth 2 (two) times daily as needed for anxiety., Disp: , Rfl:    clopidogrel (PLAVIX) 75 MG tablet, TAKE 1 TABLET(75 MG) BY MOUTH DAILY, Disp: 90 tablet, Rfl: 1   diltiazem (CARDIZEM CD) 180 MG 24 hr capsule, TAKE 1 CAPSULE(180 MG) BY MOUTH DAILY, Disp: 90 capsule, Rfl: 2   LINZESS 145 MCG CAPS capsule, Take 145 mcg by mouth daily as needed (Constipation)., Disp: , Rfl:    nitroGLYCERIN (NITROSTAT) 0.4 MG SL tablet, Place 1 tablet (0.4 mg total) under the tongue every 5 (five) minutes  as needed for chest pain., Disp: 30 tablet, Rfl: 0   REPATHA SURECLICK 140 MG/ML SOAJ, ADMINISTER 1 ML UNDER THE SKIN EVERY 14 DAYS, Disp: 2 mL, Rfl: 12   Study - [name] - olpasiran (AMG 890) 142 mg/mL or placebo SQ injection (PI-Hilty), Inject 142 mg into the skin once. For Investigational Use Only. Inject 1 mL (1 prefilled syringe) subcutaneously into appropriate injection site per protocol every 12 weeks. (Approved injection site(s): upper arm, upper thigh & abdomen). Please contact [facility] for any questions or concerns regarding this medication., Disp: , Rfl:    traZODone (DESYREL) 50 MG tablet, Take 50 mg by mouth at bedtime., Disp: , Rfl:    oxyCODONE-acetaminophen (PERCOCET/ROXICET) 5-325 MG tablet, Take 1 tablet by mouth every 6 (six) hours as needed for moderate pain. (Patient not taking: Reported on 04/27/2023), Disp: 16 tablet, Rfl: 0  Current Facility-Administered Medications:    Study - [name] - olpasiran (AMG 890) 142 mg/mL or placebo SQ injection (PI-Hilty), 142 mg, Subcutaneous, Once,

## 2023-04-29 NOTE — Research (Addendum)
Are there any labs that are clinically significant?  Yes []  OR No[x]   Please FORWARD back to me with any changes or follow up!        ACCESSION NO. [id number]                                             Page 1 of 1                                                        INVESTIGATOR: ([id number])                          PROTOCOL   [id number]                     Zoila Shutter M.D.                             INVESTIGATOR NO.: [id number]                     c/o Claretta Fraise                              SUBJECT ID: [id number]                     [facility]                 SUBJECT INITIALS NOT COLLECTED:                     [address] [place] 2G401                 VISIT: [place], [place] [address]                   SPONSOR REPORT TO:                 COLLECTION TIME:09:11 DATE:27-Apr-2023                     Mertha Baars                      DATE RECEIVED IN LABORATORY: 28-Apr-2023                     c/o Sponsor(or Addtl) [facility].Study DATE REPORTED BY LABORATORY: 28-Apr-2023                     [facility]                          SEX: F  BIRTHDATE:  01-Mar-1961    AGE: 74Z                     [address]  RANDOMIZATION NO.: [id number]                   [place], [place] [address]                                                                                        Ref. Ranges               Clinical    Comments                                                                          Significance                                                                            Yes*  No                    SM [id number] ANTIBODY COLL D/T                      CDate PreD     27-Apr-2023                                               CTime PreD     09:11                                                   SM AMG890 LPA COLLECTION D/T                      Coll Date      27-Apr-2023                                               Coll Time       09:11               ANION GAP                      Anion Gap      21      H    7-18 mEq/L  EGFR                      CKDEPI eGF     64           mL/min/1.86m2                                                            No Ref Rng                       CHEMISTRY PANEL                   #1 Total Bili     <0.2    L    0.2-1.2 mg/dL                                Dir Bili       <0.1         0.0-0.4 mg/dL                                Ind Bili       <0.2         0.0-1.2 mg/dL                                Alk Phos       134     H    35-104 U/L                                   ALT (SGPT)     9            4-43 U/L                                    AST (SGOT)     20           8-40 U/L                                    Urea Nitr      12           4-24 mg/dL                                   Creatinine     1.01         0.35-1.14 mg/dL                              Calcium        9.4          8.3-10.6 mg/dL  Total Prot     7.5          6.0-8.0 g/dL                                 Alb BCG        4.7          3.3-4.9 g/dL                                 CK                98           26-192 U/L                                   Sodium         140          135-145 mEq/L                                Potassium      3.6          3.5-5.2 mEq/L                                Bicarb         19.4         19.3-29.3 mEq/L                              Chloride       103          94-112 mEq/L                               ADJUSTED CALCIUM                      Adj Calc       9.4          8.3-10.6 mg/dL                 HEMATOLOGY&DIFFERENTIAL PANEL                      HGB            14.8         11.5-15.8 g/dL                              HCT            45           34-48 %                                      RBC            4.9          3.9-5.5 x106/uL  MCH            30           26-34 pg                                    MCHC            33           31-38 g/dL                                   RDW            14.3         12.0-15.0 %                                 RBC Morph      No Review Required                                       MCV            91           80-100 fL                                    WBC            8.05         3.80-10.70 x103/uL                           Neutrophil     3.75         1.96-7.23 x103/uL                           Lymphocyte     3.70    H    0.80-3.00 x103/uL                           Monocytes      0.41         0.12-0.92 x103/uL                           Eosinophil     0.13         0.00-0.57 x103/uL                           Basophils      0.06         0.00-0.20 x103/uL                           Neutrophil     46.6         40.5-75.0 %                                 Lymphocyte     46.0  15.4-48.5%                                   Monocytes      5.0          2.6-10.1 %                                   Eosinophil     1.7          0.0-6.8 %                                    Basophils      0.7          0.0-2.0 %                                    Platelets      269          130-394 x103/uL                            ANC                      ANC            3.75         1.96-7.23 x103/uL                    ALT > 3 X ULN                      ALT>3XULN      Criteria not met                                        ALT > 5 X ULN                      ALT>5XULN      Criteria not met                                        ALT > 8 X ULN                      ALT>8XULN      Criteria not met                                        ALT & TBIL                      ALT & TBIL     Criteria not met                                        AST > 3 X  ULN                      AST>3XULN      Criteria not met                                        AST > 5 X ULN                      AST>5XULN      Criteria not met                                        AST > 8 X ULN                       AST>8XULN      Criteria not met                                        AST & TBIL                      AST & TBIL     Criteria not met           Chrystie Nose, MD, [facility], FACP  [facility]  [facility]  Medical Director of the [facility]  [facility] Diplomate of the American Board of Clinical Lipidology Attending Cardiologist  Direct Dial: [phone]  Fax: [phone]  Website:  [email or web address]

## 2023-06-07 ENCOUNTER — Ambulatory Visit: Payer: Medicaid Other

## 2023-06-07 ENCOUNTER — Encounter (HOSPITAL_COMMUNITY): Payer: Medicaid Other

## 2023-06-17 ENCOUNTER — Other Ambulatory Visit: Payer: Self-pay | Admitting: Cardiology

## 2023-06-17 DIAGNOSIS — I2510 Atherosclerotic heart disease of native coronary artery without angina pectoris: Secondary | ICD-10-CM

## 2023-06-17 DIAGNOSIS — R5381 Other malaise: Secondary | ICD-10-CM

## 2023-06-17 DIAGNOSIS — E7800 Pure hypercholesterolemia, unspecified: Secondary | ICD-10-CM

## 2023-07-15 ENCOUNTER — Other Ambulatory Visit: Payer: Self-pay | Admitting: Cardiology

## 2023-07-15 DIAGNOSIS — I25118 Atherosclerotic heart disease of native coronary artery with other forms of angina pectoris: Secondary | ICD-10-CM

## 2023-07-15 DIAGNOSIS — E7800 Pure hypercholesterolemia, unspecified: Secondary | ICD-10-CM

## 2023-07-15 DIAGNOSIS — E7841 Elevated Lipoprotein(a): Secondary | ICD-10-CM

## 2023-07-20 ENCOUNTER — Encounter: Payer: Medicaid Other | Admitting: *Deleted

## 2023-07-20 ENCOUNTER — Other Ambulatory Visit: Payer: Self-pay

## 2023-07-20 DIAGNOSIS — Z006 Encounter for examination for normal comparison and control in clinical research program: Secondary | ICD-10-CM

## 2023-07-20 MED ORDER — STUDY - OCEAN(A) - OLPASIRAN (AMG 890) 142 MG/ML OR PLACEBO SQ INJECTION (PI-HILTY)
142.0000 mg | PREFILLED_SYRINGE | Freq: Once | SUBCUTANEOUS | Status: AC
  Administered 2023-07-20: 142 mg via SUBCUTANEOUS
  Filled 2023-07-20: qty 1

## 2023-07-20 NOTE — Research (Addendum)
Ocean A  Week 48  Vitals: [x]  Experience any AE/SAE/Hospitalizations []  Yes [x]  No  If yes please explain:  Labs collected:  0906 Post PK draw (1 hr to 72 hr post IP) :  Discussed with patient about the importance of not letting any one draw cholesterol or lipids. As to we are blinded to results. Reassured patient if we needed to be contacted the study team would reach out to our unblinded person.   *No labs next visit*  Non-Fatal Potential Endpoint Assessment Yes  No   Has the subject experienced/undergone any of the following since the last visit/contact?   []   [x]    Any Coronary Artery Revascularization/Cerebrovascular Revascularization/ Peripheral Artery Revascularization/Amputation Procedure   []   [x]    Myocardial Infarction []   [x]    Stroke   []   [x]    Provide the date for the non-fatal Potential Endpoints status:   []   [x]     IP admin please see MAR (please add Box # to comment section on MAR) [x]   [name] is here for Week 48 of Ocean a. She reports no abd pain, no changes in her meds, no visits to the Ed or Urgent care since last seen.  Vs taken at 0847. Dr Riley Kill did exam. Injection given in left arm at 0926 tol well.  Scheduled next visit for 28-Sep-2023 at 0900.  PK blood drawn at 1030.   Current Outpatient Medications:    aspirin EC 81 MG tablet, Take 81 mg by mouth daily. Swallow whole., Disp: , Rfl:    clonazePAM (KLONOPIN) 1 MG tablet, Take 0.5-1 mg by mouth 2 (two) times daily as needed for anxiety., Disp: , Rfl:    clopidogrel (PLAVIX) 75 MG tablet, TAKE 1 TABLET(75 MG) BY MOUTH DAILY, Disp: 90 tablet, Rfl: 1   diltiazem (CARDIZEM CD) 180 MG 24 hr capsule, Take 1 capsule (180 mg total) by mouth daily. TAKE 1 CAPSULE(180 MG) BY MOUTH DAILY, Disp: 90 capsule, Rfl: 1   LINZESS 145 MCG CAPS capsule, Take 145 mcg by mouth daily as needed (Constipation)., Disp: , Rfl:    nitroGLYCERIN (NITROSTAT) 0.4 MG SL tablet, Place 1 tablet (0.4 mg total) under the tongue  every 5 (five) minutes as needed for chest pain., Disp: 30 tablet, Rfl: 0   REPATHA SURECLICK 140 MG/ML SOAJ, ADMINISTER 1 ML UNDER THE SKIN EVERY 14 DAYS, Disp: 2 mL, Rfl: 12   Study - [name] - olpasiran (AMG 890) 142 mg/mL or placebo SQ injection (PI-Hilty), Inject 142 mg into the skin once. For Investigational Use Only. Inject 1 mL (1 prefilled syringe) subcutaneously into appropriate injection site per protocol every 12 weeks. (Approved injection site(s): upper arm, upper thigh & abdomen). Please contact [facility] for any questions or concerns regarding this medication., Disp: , Rfl:    traZODone (DESYREL) 50 MG tablet, Take 50 mg by mouth at bedtime., Disp: , Rfl:    oxyCODONE-acetaminophen (PERCOCET/ROXICET) 5-325 MG tablet, Take 1 tablet by mouth every 6 (six) hours as needed for moderate pain. (Patient not taking: Reported on 04/27/2023), Disp: 16 tablet, Rfl: 0

## 2023-07-22 NOTE — Progress Notes (Addendum)
  Patient is in for continuing follow up in [place] a.  She is doing well, and there is no evidence of any type of issue at present.  Seems to tolerate IP quite well.  No complaints.  Physical exam is conducted by the research nurse coordinator (with the attending physician present in the room peripherally isolating due to a severe cold supervising the exam process)     Vitals: BP 181/85 Important   (BP Location: Left Arm, Patient Position: Sitting, Cuff Size: Normal)   Pulse 75   Temp 98.1 F (36.7 C)   Resp 18   Wt 143 lb (64.9 kg)   SpO2 100%   BMI 25.33 kg/m   BSA 1.7 m   Lungs are clear carefully assessed by the nurse Cor no change from prior recorded exam. Abd  soft with good bowel sounds and no obvious masses Ext  observed by attending no edema.  Pulses intact per nurse.   ECG  NSR.  WNL.  No change from last tracing 02/02/23!   Impression Ocean a week 48  Plan   Continue in study.   Arturo Morton. Riley Kill, MD Medical Director, [facility]

## 2023-07-26 NOTE — Research (Addendum)
Are there any labs that are clinically significant?  Yes []  OR No[x]    PT prolonged - of unknown significance.  Chrystie Nose, MD, [facility], FACP  [place]  [facility]  Medical Director of the [facility] Lipid [facility] &  Cardiovascular Risk Reduction Clinic Diplomate of the American Board of Clinical Lipidology Attending Cardiologist  Direct Dial: [phone]  Fax: [phone]  Website:  [email or web address]   Please FORWARD back to me with any changes or follow up!   ACCESSION NO. [id number]                                             Page 1 of 1                                                        INVESTIGATOR: ([id number])                          PROTOCOL   [id number]                     Zoila Shutter M.D.                             INVESTIGATOR NO.: [id number]                     c/o Claretta Fraise                              SUBJECT ID: [id number]                     [facility]                 SUBJECT INITIALS NOT COLLECTED:                     [address] [place] 6U[address]                 VISIT: Michelene Gard[place], [place]                   SPONSOR REPORT TO:                 COLLECTION TIME:09:06 DATE:20-Jul-2023                     Mertha Baars                      DATE RECEIVED IN LABORATORY: 21-Jul-2023                     c/o Sponsor(or Addtl) [facility].Study DATE REPORTED BY LABORATORY: 21-Jul-2023                     [facility]  SEX: F  BIRTHDATE:  01-Mar-1961    AGE: 62y                     [address]                  Wallis Bamberg NO.: [phone]                   [place], [place] [address]                                                                                        Ref. Ranges               Clinical    Comments                                                                          Significance                                                                             Yes*  No                    FASTING GLUCOSE                      Glucose        98           70-100 mg/dL                               IS SUBJECT FASTING?                      Fasting?       Yes                      ANION GAP                      Anion Gap      21      H    7-18 mEq/L    04/27/2023  = 21  CHEMISTRY PANEL                      Total Bili     0.2          0.2-1.2 mg/dL  Dir Bili       <0.1         0.0-0.4 mg/dL                                Ind Bili       <0.2         0.0-1.2 mg/dL                                Alk Phos       126     H    35-104 U/L    04/27/2023  =  134                                  ALT (SGPT)     11           4-43 U/L                                    AST (SGOT)     22           8-40 U/L                                    Urea Nitr      10           4-24 mg/dL                                   Creatinine     0.91         0.35-1.14 mg/dL                              Calcium        9.4          8.3-10.6 mg/dL                              Total Prot     7.1          6.0-8.0 g/dL                                 Alb BCG        4.5          3.3-4.9 g/dL                                 CK             117          26-192 U/L                                   Sodium         137          135-145 mEq/L  Potassium      4.0          3.5-5.2 mEq/L                                Bicarb         21.1         19.3-29.3 mEq/L                              Chloride       99           94-112 mEq/L                               ADJUSTED CALCIUM                      Adj Calc       9.4          8.3-10.6 mg/dL   HEMATOLOGY&DIFFERENTIAL PANEL                      HGB            14.9         11.5-15.8 g/dL                              HCT            44           34-48 %                                      RBC            5.0          3.9-5.5 x106/uL                              MCH            30            26-34 pg                                    MCHC           34           31-38 g/dL                                   RDW            14.3         12.0-15.0 %                                 RBC Morph      No Review Required  MCV            88           80-100 fL                                    WBC            8.33         3.80-10.70 x103/uL                           Neutrophil     4.74         1.96-7.23 x103/uL                           Lymphocyte     2.82         0.80-3.00 x103/uL                           Monocytes      0.49         0.12-0.92 x103/uL                           Eosinophil     0.22         0.00-0.57 x103/uL                           Basophils      0.07         0.00-0.20 x103/uL                           Neutrophil     56.9         40.5-75.0 %                                 Lymphocyte     33.8         15.4-48.5%                                   Monocytes      5.8          2.6-10.1 %                                   Eosinophil     2.6          0.0-6.8 %                                    Basophils      0.9          0.0-2.0 %                                    Platelets      311          130-394 x103/uL  ANC                      ANC            4.74         1.96-7.23 x103/uL                     EGFR                      CKDEPI eGF     72           mL/min/1.76m2                                                            No Ref Rng     COAGULATION GROUP                      APTT           To follow                                                PT             To follow                                                INR            To follow                                              ALT & INR                      ALT & INR      To follow                                              AST & INR                      AST & INR      To follow                     ALT > 3 X ULN                      ALT>3XULN       Criteria not met                                        ALT > 5 X ULN  ALT>5XULN      Criteria not met                                        ALT > 8 X ULN                      ALT>8XULN      Criteria not met                                        ALT & TBIL                      ALT & TBIL     Criteria not met                                        AST > 3 X ULN                      AST>3XULN      Criteria not met                                        AST > 5 X ULN                      AST>5XULN      Criteria not met                                        AST > 8 X ULN                      AST>8XULN      Criteria not met                                        AST & TBIL                      AST & TBIL     Criteria not met      SM AMG890 ANTIBODY COLL D/T                      CDate PreD     20-Jul-2023                                               CTime PreD     09:06                                                   SM AMG890 LPA COLLECTION D/T  Coll Date      20-Jul-2023                                               Coll Time      09:06                                                   SM AMG890 PK COLLECTION D/T                      Date PostD     20-Jul-2023                                               PostDoseTM     10:30                                                   SM AMG890 BMD SERUM COLL D/T                      Coll Date      20-Jul-2023                                               Coll Time      09:06                                                   SM AMG890 BMD PLASMA COLL D/T                      Coll Date      20-Jul-2023                                               Coll Time      09:06    HS-CRP                      CRPHS          0.77    H    0.00-0.50 mg/dL   78/29/5621  =  3.08                             HBA1C                      Hgb A1c        5.1          <6.5%  COAGULATION GROUP                       APTT         29.3         21.9-29.4 sec                                PT             16.5    H    9.7-12.3 sec    02/02/2023  =  9.6                                INR            1.7          Patient not taking                                                       oral anticoagulant:                                                      0.8 - 1.2                                                                Patient taking                                                          oral anticoagulant:                                                      2.0 - 3.0                                  ALT & INR                      ALT & INR      To follow                                              AST & INR                      AST & INR      To follow  ALT & INR                      ALT & INR      Criteria not met                                        AST & INR                      AST & INR      Criteria not met  Dear Investigator, Please instruct this subject on compliance to background lipid-lowering therapy, dietary regimen, and a healthy lifestyle to ensure LDL-C remains within goal. This reminder is also sent to subjects in both treatment arms at random to avoid unblinding. Regards, [facility]

## 2023-09-17 ENCOUNTER — Ambulatory Visit: Payer: Medicaid Other | Admitting: Cardiology

## 2023-09-28 ENCOUNTER — Encounter: Payer: Medicaid Other | Admitting: *Deleted

## 2023-09-28 ENCOUNTER — Encounter: Payer: Self-pay | Admitting: Cardiology

## 2023-09-28 DIAGNOSIS — R112 Nausea with vomiting, unspecified: Secondary | ICD-10-CM | POA: Insufficient documentation

## 2023-09-28 DIAGNOSIS — R519 Headache, unspecified: Secondary | ICD-10-CM | POA: Insufficient documentation

## 2023-09-28 DIAGNOSIS — T8859XA Other complications of anesthesia, initial encounter: Secondary | ICD-10-CM | POA: Insufficient documentation

## 2023-09-28 DIAGNOSIS — Z87442 Personal history of urinary calculi: Secondary | ICD-10-CM | POA: Insufficient documentation

## 2023-09-28 DIAGNOSIS — Z8489 Family history of other specified conditions: Secondary | ICD-10-CM | POA: Insufficient documentation

## 2023-09-28 DIAGNOSIS — Z006 Encounter for examination for normal comparison and control in clinical research program: Secondary | ICD-10-CM

## 2023-09-28 MED ORDER — STUDY - OCEAN(A) - OLPASIRAN (AMG 890) 142 MG/ML OR PLACEBO SQ INJECTION (PI-HILTY)
142.0000 mg | PREFILLED_SYRINGE | Freq: Once | SUBCUTANEOUS | Status: AC
  Administered 2023-09-28: 142 mg via SUBCUTANEOUS
  Filled 2023-09-28: qty 1

## 2023-09-28 NOTE — Research (Signed)
cean A  Week 60 Vitals: [x]  Experience any AE/SAE/Hospitalizations []  Yes [x]  No  If yes please explain:   Only urine lab needed is urine preg if applicable: []  Yes [x]  No if yes time:   Discussed with patient about the importance of not letting any one draw cholesterol or lipids. As to we are blinded to results. Reassured patient if we needed to be contacted the study team would reach out to our unblinded person.    Non-Fatal Potential Endpoint Assessment Yes  No   Has the subject experienced/undergone any of the following since the last visit/contact?   []   [x]    Any Coronary Artery Revascularization/Cerebrovascular Revascularization/ Peripheral Artery Revascularization/Amputation Procedure   []   [x]    Myocardial Infarction []   [x]    Stroke   []   [x]    Provide the date for the non-fatal Potential Endpoints status:   []   [x]     IP admin please see MAR (please add Box # to comment section on MAR) [x]    Current Outpatient Medications:    aspirin EC 81 MG tablet, Take 81 mg by mouth daily. Swallow whole., Disp: , Rfl:    clonazePAM (KLONOPIN) 1 MG tablet, Take 0.5-1 mg by mouth 2 (two) times daily as needed for anxiety., Disp: , Rfl:    clopidogrel (PLAVIX) 75 MG tablet, TAKE 1 TABLET(75 MG) BY MOUTH DAILY, Disp: 90 tablet, Rfl: 1   diltiazem (CARDIZEM CD) 180 MG 24 hr capsule, Take 1 capsule (180 mg total) by mouth daily. TAKE 1 CAPSULE(180 MG) BY MOUTH DAILY, Disp: 90 capsule, Rfl: 1   LINZESS 145 MCG CAPS capsule, Take 145 mcg by mouth daily as needed (Constipation)., Disp: , Rfl:    nitroGLYCERIN (NITROSTAT) 0.4 MG SL tablet, Place 1 tablet (0.4 mg total) under the tongue every 5 (five) minutes as needed for chest pain., Disp: 30 tablet, Rfl: 0   REPATHA SURECLICK 140 MG/ML SOAJ, ADMINISTER 1 ML UNDER THE SKIN EVERY 14 DAYS, Disp: 2 mL, Rfl: 12   Study - [name] - olpasiran (AMG 890) 142 mg/mL or placebo SQ injection (PI-Hilty), Inject 142 mg into the skin once. For  Investigational Use Only. Inject 1 mL (1 prefilled syringe) subcutaneously into appropriate injection site per protocol every 12 weeks. (Approved injection site(s): upper arm, upper thigh & abdomen). Please contact [facility] for any questions or concerns regarding this medication., Disp: , Rfl:    traZODone (DESYREL) 50 MG tablet, Take 50 mg by mouth at bedtime., Disp: , Rfl:    oxyCODONE-acetaminophen (PERCOCET/ROXICET) 5-325 MG tablet, Take 1 tablet by mouth every 6 (six) hours as needed for moderate pain. (Patient not taking: Reported on 04/27/2023), Disp: 16 tablet, Rfl: 0  Current Facility-Administered Medications:    Study - [name] - olpasiran (AMG 890) 142 mg/mL or placebo SQ injection (PI-Hilty), 142 mg, Subcutaneous, Once,

## 2023-09-29 NOTE — Progress Notes (Unsigned)
Cardiology Office Note:    Date:  09/30/2023   ID:  Alyssa, Ritter [birth year], MRN [id number]  PCP:  Lonie Peak, PA-C  Cardiologist:  Norman Herrlich, MD   Referring MD: Lonie Peak, PA-C  ASSESSMENT:    1. Coronary artery disease of native artery of native heart with stable angina pectoris (HCC)   2. Hypercholesteremia   3. Elevated lipoprotein(a)   4. Bilateral carotid artery stenosis    PLAN:    In order of problems listed above:  She continues to do well with CAD no anginal discomfort continue her long-term dual antiplatelet therapy with concurrent carotid artery disease rate limiting calcium channel blocker and lipid-lowering statin intolerant with Repatha and she is an investigational trial for LP(a) therapy Check an ApoB and lipid profile today Followed by vascular surgery [place]  Next appointment 6 months   Medication Adjustments/Labs and Tests Ordered: Current medicines are reviewed at length with the patient today.  Concerns regarding medicines are outlined above.  Orders Placed This Encounter  Procedures   Lipid Profile   Apolipoprotein B   No orders of the defined types were placed in this encounter.    No chief complaint on file.   History of Present Illness:    Alyssa Ritter is a 63 y.o. female with a history of CAD PCI and stent of the right coronary artery 09/06/2020 familial hyperlipidemia with elevated LP(a) and carotid stenosis followed by vascular surgery in [place] last seen 02/03/2023.  She continues to do well from a cardiology perspective she remains in the study ocean a no side effects from the investigational medication has not having chest pain edema orthopnea shortness of breath palpitation or syncope She continues on chronic dual antiplatelet therapy  Past Medical History:  Diagnosis Date   Abnormal cardiac CT angiography 09/06/2020   Anxiety disorder    Benign essential tremor    CAD (coronary artery  disease)    Carotid artery occlusion 11/09/2020   Carotid stenosis, asymptomatic, left 02/26/2023   Complication of anesthesia    Hard time waking up and BP drops   Constipation    Coronary artery disease involving native coronary artery of native heart with angina pectoris (HCC)    Depression    Depression with anxiety    Family history of adverse reaction to anesthesia    Mother had same reaction to anesthesia   Headache    Hx Migraines   Hematuria    History of kidney stones    Hypercholesteremia    Peripheral vascular disease (HCC)    Left Carotid Stenosis   Peripheral vascular disease of extremity (HCC)    PONV (postoperative nausea and vomiting)    Progressive angina (HCC) 09/06/2020   Stroke (HCC) 10/2020   TIA   TIA (transient ischemic attack) 11/08/2020   Weight loss, abnormal     Past Surgical History:  Procedure Laterality Date   CESAREAN SECTION  1980   COLONOSCOPY     CORONARY STENT INTERVENTION N/A 09/06/2020   Procedure: CORONARY STENT INTERVENTION;  Surgeon: Marykay Lex, MD;  Location: [facility] INVASIVE CV LAB;  Service: Cardiovascular;  Laterality: N/A;   ENDARTERECTOMY Left 02/26/2023   Procedure: ENDARTERECTOMY CAROTID WITH Livia Snellen PATCH ANGIOPLASTY;  Surgeon: Nada Libman, MD;  Location: [facility] OR;  Service: Vascular;  Laterality: Left;   IR ANGIO INTRA EXTRACRAN SEL COM CAROTID INNOMINATE BILAT MOD SED  01/08/2023   IR ANGIO VERTEBRAL SEL VERTEBRAL BILAT MOD SED  01/08/2023   IR  RADIOLOGIST EVAL & MGMT  12/22/2022   LEFT HEART CATH AND CORONARY ANGIOGRAPHY N/A 09/06/2020   Procedure: LEFT HEART CATH AND CORONARY ANGIOGRAPHY;  Surgeon: Marykay Lex, MD;  Location: [facility] INVASIVE CV LAB;  Service: Cardiovascular;  Laterality: N/A;   OOPHORECTOMY     Right cyst removed but both ovaries not removed   ROTATOR CUFF REPAIR Left 2006   TONSILLECTOMY     As a child   TUBAL LIGATION  1992    Current Medications: No outpatient medications have been marked as  taking for the 09/30/23 encounter (Office Visit) with Baldo Daub, MD.     Allergies:   Penicillins and Statins   Social History   Socioeconomic History   Marital status: Married    Spouse name: Not on file   Number of children: Not on file   Years of education: Not on file   Highest education level: Not on file  Occupational History   Not on file  Tobacco Use   Smoking status: Former    Current packs/day: 0.00    Types: Cigarettes    Quit date: 01/2012    Years since quitting: 11.6    Passive exposure: Current   Smokeless tobacco: Former    Quit date: 08/31/2017  Vaping Use   Vaping status: Former   Quit date: 01/29/2018  Substance and Sexual Activity   Alcohol use: Yes    Comment: occasionally   Drug use: Never   Sexual activity: Yes  Other Topics Concern   Not on file  Social History Narrative   Not on file   Social Drivers of Health   Financial Resource Strain: Not on file  Food Insecurity: No Food Insecurity (02/26/2023)   Hunger Vital Sign    Worried About Running Out of Food in the Last Year: Never true    Ran Out of Food in the Last Year: Never true  Transportation Needs: Not on file  Physical Activity: Not on file  Stress: Not on file  Social Connections: Unknown (01/13/2022)   Received [facility]   Social Network    Social Network: Not on file     Family History: The patient's family history includes Arthritis in her mother and sister; CAD in her father; Colon polyps in her mother; Hypercholesterolemia in her father and mother; Hypertension in her brother, father, and mother; Seizures in her mother; Stroke in her brother; Thyroid disease in her sister. There is no history of Colon cancer or Breast cancer.  ROS:   ROS Please see the history of present illness.     All other systems reviewed and are negative.  EKGs/Labs/Other Studies Reviewed:    The following studies were reviewed today:        Recent Labs: 02/17/2023:  ALT 11 02/27/2023: BUN 14; Creatinine, Ser 1.10; Hemoglobin 11.3; Platelets 236; Potassium 4.1; Sodium 137  Recent Lipid Panel    Component Value Date/Time   CHOL 119 02/27/2023 0540   CHOL 150 04/02/2022 0939   TRIG 114 02/27/2023 0540   HDL 51 02/27/2023 0540   HDL 49 04/02/2022 0939   CHOLHDL 2.3 02/27/2023 0540   VLDL 23 02/27/2023 0540   LDLCALC 45 02/27/2023 0540   LDLCALC 67 04/02/2022 0939    Physical Exam:    VS:  BP 102/70   Pulse 63   Ht 5\' 3"  (1.6 m)   Wt 147 lb 6.4 oz (66.9 kg)   SpO2 98%   BMI 26.11 kg/m  Wt Readings from Last 3 Encounters:  09/30/23 147 lb 6.4 oz (66.9 kg)  07/20/23 143 lb (64.9 kg)  04/27/23 140 lb (63.5 kg)     GEN: She has no arcus senilis xanthoma or xanthelasma well nourished, well developed in no acute distress HEENT: Normal NECK: No JVD; No carotid bruits LYMPHATICS: No lymphadenopathy CARDIAC: RRR, no murmurs, rubs, gallops RESPIRATORY:  Clear to auscultation without rales, wheezing or rhonchi  ABDOMEN: Soft, non-tender, non-distended MUSCULOSKELETAL:  No edema; No deformity  SKIN: Warm and dry NEUROLOGIC:  Alert and oriented x 3 PSYCHIATRIC:  Normal affect     Signed, Norman Herrlich, MD  09/30/2023 8:16 AM    [facility]

## 2023-09-30 ENCOUNTER — Ambulatory Visit: Payer: Medicaid Other | Attending: Cardiology | Admitting: Cardiology

## 2023-09-30 ENCOUNTER — Encounter: Payer: Self-pay | Admitting: Cardiology

## 2023-09-30 VITALS — BP 102/70 | HR 63 | Ht 63.0 in | Wt 147.4 lb

## 2023-09-30 DIAGNOSIS — I25118 Atherosclerotic heart disease of native coronary artery with other forms of angina pectoris: Secondary | ICD-10-CM | POA: Diagnosis not present

## 2023-09-30 DIAGNOSIS — I6523 Occlusion and stenosis of bilateral carotid arteries: Secondary | ICD-10-CM | POA: Diagnosis not present

## 2023-09-30 DIAGNOSIS — E7841 Elevated Lipoprotein(a): Secondary | ICD-10-CM | POA: Diagnosis not present

## 2023-09-30 DIAGNOSIS — R112 Nausea with vomiting, unspecified: Secondary | ICD-10-CM

## 2023-09-30 DIAGNOSIS — Z87442 Personal history of urinary calculi: Secondary | ICD-10-CM

## 2023-09-30 DIAGNOSIS — E7800 Pure hypercholesterolemia, unspecified: Secondary | ICD-10-CM | POA: Diagnosis not present

## 2023-09-30 DIAGNOSIS — Z8489 Family history of other specified conditions: Secondary | ICD-10-CM

## 2023-09-30 NOTE — Patient Instructions (Signed)
Medication Instructions:  Your physician recommends that you continue on your current medications as directed. Please refer to the Current Medication list given to you today.  *If you need a refill on your cardiac medications before your next appointment, please call your pharmacy*   Lab Work: Your physician recommends that you return for lab work in:   Labs today: Lipids, Apo B  If you have labs (blood work) drawn today and your tests are completely normal, you will receive your results only by: MyChart Message (if you have MyChart) OR A paper copy in the mail If you have any lab test that is abnormal or we need to change your treatment, we will call you to review the results.   Testing/Procedures: None   Follow-Up: At [facility], you and your health needs are our priority.  As part of our continuing mission to provide you with exceptional heart care, we have created designated Provider Care Teams.  These Care Teams include your primary Cardiologist (physician) and Advanced Practice Providers (APPs -  Physician Assistants and Nurse Practitioners) who all work together to provide you with the care you need, when you need it.  We recommend signing up for the patient portal called "MyChart".  Sign up information is provided on this After Visit Summary.  MyChart is used to connect with patients for Virtual Visits (Telemedicine).  Patients are able to view lab/test results, encounter notes, upcoming appointments, etc.  Non-urgent messages can be sent to your provider as well.   To learn more about what you can do with MyChart, go to [email or web address].    Your next appointment:   6 month(s)  Provider:   Norman Herrlich, MD    Other Instructions None

## 2023-10-01 LAB — LIPID PANEL
Chol/HDL Ratio: 3.1 {ratio} (ref 0.0–4.4)
Cholesterol, Total: 180 mg/dL (ref 100–199)
HDL: 59 mg/dL (ref >39)
LDL Chol Calc (NIH): 94 mg/dL (ref 0–99)
Triglycerides: 156 mg/dL — ABNORMAL HIGH (ref 0–149)
VLDL Cholesterol Cal: 27 mg/dL (ref 5–40)

## 2023-10-01 LAB — APOLIPOPROTEIN B: Apolipoprotein B: 85 mg/dL (ref <90)

## 2023-10-07 ENCOUNTER — Other Ambulatory Visit: Payer: Self-pay | Admitting: Cardiology

## 2023-12-10 ENCOUNTER — Other Ambulatory Visit: Payer: Self-pay | Admitting: Cardiology

## 2023-12-10 DIAGNOSIS — I2510 Atherosclerotic heart disease of native coronary artery without angina pectoris: Secondary | ICD-10-CM

## 2023-12-10 DIAGNOSIS — R5381 Other malaise: Secondary | ICD-10-CM

## 2023-12-10 DIAGNOSIS — E7800 Pure hypercholesterolemia, unspecified: Secondary | ICD-10-CM

## 2023-12-10 NOTE — Telephone Encounter (Signed)
 Prescription sent to pharmacy.

## 2023-12-28 ENCOUNTER — Encounter: Payer: Medicaid Other | Admitting: *Deleted

## 2023-12-28 DIAGNOSIS — Z006 Encounter for examination for normal comparison and control in clinical research program: Secondary | ICD-10-CM

## 2023-12-28 MED ORDER — STUDY - OCEAN(A) - OLPASIRAN (AMG 890) 142 MG/ML OR PLACEBO SQ INJECTION (PI-HILTY)
142.0000 mg | PREFILLED_SYRINGE | Freq: Once | SUBCUTANEOUS | Status: AC
  Administered 2023-12-28: 142 mg via SUBCUTANEOUS
  Filled 2023-12-28: qty 1

## 2023-12-28 NOTE — Research (Signed)
 Ocean A  Week 72   Vitals: [x]  Experience any AE/SAE/Hospitalizations [x]   Labs: 0905 If yes please explain:   NO LABS AT NEXT 84 WEEK VISIT  Spoke with patient about lipid blinding and the importance.  Non-Fatal Potential Endpoint Assessment Yes  No   Has the subject experienced/undergone any of the following since the last visit/contact?   []   [x]    Any Coronary Artery Revascularization/Cerebrovascular Revascularization/ Peripheral Artery Revascularization/Amputation Procedure   []   [x]    Myocardial Infarction []   [x]    Stroke   []   [x]    Provide the date for the non-fatal Potential Endpoints status:   []   [x]    IP admin please see MAR (please add Box # to comment section on MAR) [x]    Current Outpatient Medications:    aspirin  EC 81 MG tablet, Take 81 mg by mouth daily. Swallow whole., Disp: , Rfl:    clonazePAM (KLONOPIN) 1 MG tablet, Take 0.5-1 mg by mouth 2 (two) times daily as needed for anxiety., Disp: , Rfl:    clopidogrel  (PLAVIX ) 75 MG tablet, TAKE 1 TABLET(75 MG) BY MOUTH DAILY, Disp: 90 tablet, Rfl: 1   diltiazem  (CARDIZEM  CD) 180 MG 24 hr capsule, TAKE 1 CAPSULE(180 MG) BY MOUTH DAILY, Disp: 90 capsule, Rfl: 2   LINZESS 145 MCG CAPS capsule, Take 145 mcg by mouth daily as needed (Constipation)., Disp: , Rfl:    nitroGLYCERIN  (NITROSTAT ) 0.4 MG SL tablet, Place 1 tablet (0.4 mg total) under the tongue every 5 (five) minutes as needed for chest pain., Disp: 30 tablet, Rfl: 0   REPATHA  SURECLICK 140 MG/ML SOAJ, ADMINISTER 1 ML UNDER THE SKIN EVERY 14 DAYS, Disp: 2 mL, Rfl: 12   Study - [name] - olpasiran (AMG 890) 142 mg/mL or placebo SQ injection (PI-Hilty), Inject 142 mg into the skin once. For Investigational Use Only. Inject 1 mL (1 prefilled syringe) subcutaneously into appropriate injection site per protocol every 12 weeks. (Approved injection site(s): upper arm, upper thigh & abdomen). Please contact [facility] for any questions or concerns  regarding this medication., Disp: , Rfl:    traZODone  (DESYREL ) 50 MG tablet, Take 50 mg by mouth at bedtime., Disp: , Rfl:   Current Facility-Administered Medications:    Study - [name] - olpasiran (AMG 890) 142 mg/mL or placebo SQ injection (PI-Hilty), 142 mg, Subcutaneous, Once,

## 2023-12-28 NOTE — Research (Signed)
 Ocean A Informed Consent   Subject Name: Alyssa Ritter  Subject met inclusion and exclusion criteria.  The informed consent form, study requirements and expectations were reviewed with the subject and questions and concerns were addressed prior to the signing of the consent form.  The subject verbalized understanding of the trial requirements.  The subject agreed to participate in the [place] A trial and signed the informed consent on 12/28/2023.  The informed consent was obtained prior to performance of any protocol-specific procedures for the subject.  A copy of the signed informed consent was given to the subject and a copy was placed in the subject's medical record.     Subject re-consented to  Version: 7 IRB approved: 26Mar2025  [name] D

## 2023-12-31 NOTE — Research (Addendum)
 Are there any labs that are clinically significant?  Yes []  OR No[x]   Please FORWARD back to me with any changes or follow up!   Hazle Lites, MD, [facility] [place], FNLA, FACP  [place]  [facility]  Medical Director of the Advanced Lipid Disorders &  Cardiovascular Risk Reduction Clinic Diplomate of the American Board of Clinical Lipidology Attending Cardiologist  Direct Dial: [phone]  Fax: [phone]  Website:  [email or web address]                  ACCESSION NO. [id number]                                             Page 1 of 1                                                        INVESTIGATOR: ([id number])                          PROTOCOL   [id number]                     Alyssa Ritter M.D.                             INVESTIGATOR NO.: [id number]                     c/o Gracelyn Laurence                              SUBJECT ID: [id number]                     [facility]                 SUBJECT INITIALS NOT COLLECTED:                     [address] [place] [address]                 VISIT: [place], [place]  [id number]                   SPONSOR REPORT TO:                 COLLECTION TIME:09:05 DATE:28-Dec-2023                     Lara Plants                      DATE RECEIVED IN LABORATORY: 29-Dec-2023                     c/o Sponsor(or Addtl) El[facility].Study DATE REPORTED BY LABORATORY: 29-Dec-2023                     [facility]  SEX: F  BIRTHDATE:  01-Mar-1961    AGE: 31B                     [address]                  Raeanne Bull NO.: [phone]                   [place], [place]  [address]                                                                                        Ref. Ranges               Clinical    Comments                                                                          Significance                                                                            Yes*  No                     SM [id number] ANTIBODY COLL D/T                      CDate PreD     28-Dec-2023                                               CTime PreD     09:05                      EGFR                      CKDEPI eGF     61           mL/min/1.13m2                                                            No Ref Rng     HBA1C                      Hgb A1c  5.5          <6.5%                       CHEMISTRY PANEL                      Total Bili     <0.2    L    0.2-1.2 mg/dL          16/06/9603 - <5.4                      D-BilGen2      <0.08        0.00-0.20 mg/dL                              Ind Bili       <0.2         0.0-1.2 mg/dL                                Alk Phos       127     H    35-104 U/L       04/26/2024 - 134                            ALT (SGPT)     10           4-43 U/L                                    AST (SGOT)     20           8-40 U/L                                    Urea Nitr      13           4-24 mg/dL                                   Creatinine     1.05         0.35-1.14 mg/dL                              Calcium        9.5          8.3-10.6 mg/dL                              Total Prot     7.3          6.0-8.0 g/dL                                 Alb BCG        4.6          3.3-4.9 g/dL  CK             58           26-192 U/L                                   Sodium         137          135-145 mEq/L                                Potassium      4.2          3.5-5.2 mEq/L                                Bicarb         20.9         19.3-29.3 mEq/L                              Chloride       99           94-112 mEq/L                               ADJUSTED CALCIUM                      Adj Calc       9.5          8.3-10.6 mg/dL                     HEMATOLOGY&DIFFERENTIAL PANEL                      HGB            15.2         11.5-15.8 g/dL                              HCT            45           34-48 %                                       RBC            5.2          3.9-5.5 x106/uL                              MCH            29           26-34 pg                                    MCHC           34           31-38 g/dL  RDW            13.9         12.0-15.0 %                                 RBC Morph      No Review Required                                       MCV            86           80-100 fL                                    WBC            8.14         3.80-10.70 x103/uL                           Neutrophil     3.90         1.96-7.23 x103/uL                           Lymphocyte     3.56    H    0.80-3.00 x103/uL     04/26/2024 - 3.70                      Monocytes      0.46         0.12-0.92 x103/uL                           Eosinophil     0.14         0.00-0.57 x103/uL                           Basophils      0.08         0.00-0.20 x103/uL                           Neutrophil     47.9         40.5-75.0 %                                 Lymphocyte     43.7         15.4-48.5%                                   Monocytes      5.6          2.6-10.1 %                                   Eosinophil     1.7          0.0-6.8 %  Basophils      1.0          0.0-2.0 %                                    Platelets      311          130-394 x103/uL                            ANC                      ANC            3.90         1.96-7.23 x103/uL     ALT > 3 X ULN                      ALT>3XULN      Criteria not met                                        ALT > 5 X ULN                      ALT>5XULN      Criteria not met                                        ALT > 8 X ULN                      ALT>8XULN      Criteria not met                                        ALT & TBIL                      ALT & TBIL     Criteria not met                                        AST > 3 X ULN                      AST>3XULN      Criteria not met                                         AST > 5 X ULN                      AST>5XULN      Criteria not met                                        AST > 8 X ULN  AST>8XULN      Criteria not met                                        AST & TBIL                      AST & TBIL     Criteria not met         ANION GAP                      Anion Gap      21      H    7-18 mEq/L    04/26/2024 - 21  COAGULATION GROUP                      APTT           25.1         21.9-29.4 sec                                PT             10.0         9.7-12.3 sec                                 INR            0.9          Patient not taking                                                       oral anticoagulant:                                                      0.8 - 1.2                                                                Patient taking                                                          oral anticoagulant:                                                      2.0 - 3.0  ALT & INR                      ALT & INR      Criteria not met                                        AST & INR                      AST & INR      Criteria not met

## 2024-01-07 ENCOUNTER — Other Ambulatory Visit: Payer: Self-pay | Admitting: *Deleted

## 2024-01-07 DIAGNOSIS — Z9889 Other specified postprocedural states: Secondary | ICD-10-CM

## 2024-01-07 DIAGNOSIS — I6523 Occlusion and stenosis of bilateral carotid arteries: Secondary | ICD-10-CM

## 2024-01-17 ENCOUNTER — Encounter: Payer: Self-pay | Admitting: Surgery

## 2024-01-17 ENCOUNTER — Ambulatory Visit (HOSPITAL_COMMUNITY)
Admission: RE | Admit: 2024-01-17 | Discharge: 2024-01-17 | Disposition: A | Discharge status: Home / Self Care | Payer: Medicaid Other | Source: Ambulatory Visit | Attending: Surgery | Admitting: Surgery

## 2024-01-17 ENCOUNTER — Ambulatory Visit: Payer: Medicaid Other | Admitting: Surgery

## 2024-01-17 VITALS — BP 140/86 | HR 72 | Temp 97.9°F | Resp 18 | Ht 63.0 in | Wt 143.6 lb

## 2024-01-17 DIAGNOSIS — Z9889 Other specified postprocedural states: Secondary | ICD-10-CM | POA: Diagnosis not present

## 2024-01-17 DIAGNOSIS — I6523 Occlusion and stenosis of bilateral carotid arteries: Secondary | ICD-10-CM | POA: Diagnosis not present

## 2024-01-17 NOTE — Progress Notes (Signed)
 Vascular and Vein Specialist of   Patient name: Alyssa Ritter MRN: [id number] DOB: April 28, 1961 Sex: female   REASON FOR VISIT:    Follow up  HISOTRY OF PRESENT ILLNESS:    Alyssa Ritter is a 63 y.o. female who is status post left carotid endarterectomy for asymptomatic stenosis on 02/26/2023.  Intraoperative findings included a 90% stenosis.  Postoperative course was uncomplicated  She suffers from hypercholesterolemia and is managed with Repatha  secondary to a statin allergy.  She is a former smoker.  She sees Dr. Sandee Crook for her coronary disease.  She is status post PCI.  She has a history of right iliac stenting in 2004 PAST MEDICAL HISTORY:   Past Medical History:  Diagnosis Date   Abnormal cardiac CT angiography 09/06/2020   Anxiety disorder    Benign essential tremor    CAD (coronary artery disease)    Carotid artery occlusion 11/09/2020   Carotid stenosis, asymptomatic, left 02/26/2023   Complication of anesthesia    Hard time waking up and BP drops   Constipation    Coronary artery disease involving native coronary artery of native heart with angina pectoris (HCC)    Depression    Depression with anxiety    Family history of adverse reaction to anesthesia    Mother had same reaction to anesthesia   Headache    Hx Migraines   Hematuria    History of kidney stones    Hypercholesteremia    Peripheral vascular disease (HCC)    Left Carotid Stenosis   Peripheral vascular disease of extremity (HCC)    PONV (postoperative nausea and vomiting)    Progressive angina (HCC) 09/06/2020   Stroke (HCC) 10/2020   TIA   TIA (transient ischemic attack) 11/08/2020   Weight loss, abnormal      FAMILY HISTORY:   Family History  Problem Relation Age of Onset   Colon polyps Mother    Hypertension Mother    Hypercholesterolemia Mother    Arthritis Mother    Seizures Mother    Hypertension Father    CAD Father         died of complications of AML   Hypercholesterolemia Father    Thyroid disease Sister    Arthritis Sister    Hypertension Brother    Stroke Brother    Colon cancer Neg Hx    Breast cancer Neg Hx     SOCIAL HISTORY:   Social History   Tobacco Use   Smoking status: Former    Current packs/day: 0.00    Types: Cigarettes    Quit date: 01/2012    Years since quitting: 11.9    Passive exposure: Current   Smokeless tobacco: Former    Quit date: 08/31/2017  Substance Use Topics   Alcohol use: Yes    Comment: occasionally     ALLERGIES:   Allergies  Allergen Reactions   Penicillins Anaphylaxis   Statins     Kidney Injury     CURRENT MEDICATIONS:   Current Outpatient Medications  Medication Sig Dispense Refill   aspirin  EC 81 MG tablet Take 81 mg by mouth daily. Swallow whole.     clonazePAM (KLONOPIN) 1 MG tablet Take 0.5-1 mg by mouth 2 (two) times daily as needed for anxiety.     clopidogrel  (PLAVIX ) 75 MG tablet TAKE 1 TABLET(75 MG) BY MOUTH DAILY 90 tablet 1   diltiazem  (CARDIZEM  CD) 180 MG 24 hr capsule TAKE 1 CAPSULE(180 MG) BY MOUTH DAILY 90 capsule  2   LINZESS 145 MCG CAPS capsule Take 145 mcg by mouth daily as needed (Constipation).     nitroGLYCERIN  (NITROSTAT ) 0.4 MG SL tablet Place 1 tablet (0.4 mg total) under the tongue every 5 (five) minutes as needed for chest pain. 30 tablet 0   REPATHA  SURECLICK 140 MG/ML SOAJ ADMINISTER 1 ML UNDER THE SKIN EVERY 14 DAYS 2 mL 12   Study - [name] - olpasiran (AMG 890) 142 mg/mL or placebo SQ injection (PI-Hilty) Inject 142 mg into the skin once. For Investigational Use Only. Inject 1 mL (1 prefilled syringe) subcutaneously into appropriate injection site per protocol every 12 weeks. (Approved injection site(s): upper arm, upper thigh & abdomen). Please contact  [facility] for any questions or concerns regarding this medication.     traZODone  (DESYREL ) 50 MG tablet Take 50 mg by mouth at bedtime.     No  current facility-administered medications for this visit.    REVIEW OF SYSTEMS:   [X]  denotes positive finding, [ ]  denotes negative finding Cardiac  Comments:  Chest pain or chest pressure:    Shortness of breath upon exertion:    Short of breath when lying flat:    Irregular heart rhythm:        Vascular    Pain in calf, thigh, or hip brought on by ambulation:    Pain in feet at night that wakes you up from your sleep:     Blood clot in your veins:    Leg swelling:         Pulmonary    Oxygen at home:    Productive cough:     Wheezing:         Neurologic    Sudden weakness in arms or legs:     Sudden numbness in arms or legs:     Sudden onset of difficulty speaking or slurred speech:    Temporary loss of vision in one eye:     Problems with dizziness:         Gastrointestinal    Blood in stool:     Vomited blood:         Genitourinary    Burning when urinating:     Blood in urine:        Psychiatric    Major depression:         Hematologic    Bleeding problems:    Problems with blood clotting too easily:        Skin    Rashes or ulcers:        Constitutional    Fever or chills:      PHYSICAL EXAM:   Vitals:   01/17/24 1001 01/17/24 1004  BP: (!) 154/76 (!) 140/86  Pulse: 72   Resp: 18   Temp: 97.9 F (36.6 C)   TempSrc: Temporal   SpO2: 99%   Weight: 143 lb 9.6 oz (65.1 kg)   Height: 5\' 3"  (1.6 m)     GENERAL: The patient is a well-nourished female, in no acute distress. The vital signs are documented above. CARDIAC: There is a regular rate and rhythm.  PULMONARY: Non-labored respirations ABDOMEN: Soft and non-tender  MUSCULOSKELETAL: There are no major deformities or cyanosis. NEUROLOGIC: No focal weakness or paresthesias are detected. SKIN: There are no ulcers or rashes noted. PSYCHIATRIC: The patient has a normal affect.  STUDIES:   I have reviewed the following carotid duplex: Right Carotid: Velocities in the right ICA are  consistent with a  1-39%  stenosis.   Left Carotid: There was no evidence of thrombus, dissection,  atherosclerotic               plaque or stenosis in the cervical carotid system.   Vertebrals:  Bilateral vertebral arteries demonstrate antegrade flow.  Subclavians: Normal flow hemodynamics were seen in bilateral subclavian               arteries.   MEDICAL ISSUES:   Carotid: She remains asymptomatic.  No significant stenosis was seen on ultrasound today.  She will get surveillance imaging in 1 year  History of right iliac stent: I will get a duplex and ABIs to evaluate this when she returns    Gareld June, IV, MD, FACS V[facility] and Vein Specialists of [facility] [phone] Pager [phone]

## 2024-02-25 ENCOUNTER — Encounter (HOSPITAL_COMMUNITY): Payer: Self-pay | Admitting: Interventional Radiology

## 2024-03-14 ENCOUNTER — Encounter

## 2024-03-28 ENCOUNTER — Ambulatory Visit: Admitting: Cardiology

## 2024-04-01 ENCOUNTER — Other Ambulatory Visit: Payer: Self-pay | Admitting: Cardiology

## 2024-04-18 ENCOUNTER — Encounter: Admitting: *Deleted

## 2024-04-18 VITALS — BP 158/77 | HR 73 | Temp 97.7°F | Resp 18 | Wt 143.0 lb

## 2024-04-18 DIAGNOSIS — Z006 Encounter for examination for normal comparison and control in clinical research program: Secondary | ICD-10-CM

## 2024-04-18 MED ORDER — STUDY - OCEAN(A) - OLPASIRAN (AMG 890) 142 MG/ML OR PLACEBO SQ INJECTION (PI-HILTY)
142.0000 mg | PREFILLED_SYRINGE | Freq: Once | SUBCUTANEOUS | Status: AC
  Administered 2024-04-18: 142 mg via SUBCUTANEOUS
  Filled 2024-04-18: qty 1

## 2024-04-18 NOTE — Research (Signed)
 Ocean A  Week 84  Vitals: [x]  Experience any AE/SAE/Hospitalizations []  Yes [x]  No  If yes please explain:  Labs collected:  no labs  Discussed with patient about the importance of not letting any one draw cholesterol or lipids. As to we are blinded to results. Reassured patient if we needed to be contacted the study team would reach out to our unblinded person.   ~reminder fasting labs, EKG, MD to see next visit ~  Non-Fatal Potential Endpoint Assessment Yes  No   Has the subject experienced/undergone any of the following since the last visit/contact?   []   [x]    Any Coronary Artery Revascularization/Cerebrovascular Revascularization/ Peripheral Artery Revascularization/Amputation Procedure   []   [x]    Myocardial Infarction []   [x]    Stroke   []   [x]    Provide the date for the non-fatal Potential Endpoints status:   []   [x]     IP admin please see MAR (please add Box # to comment section on MAR) [x]     Current Outpatient Medications:    aspirin  EC 81 MG tablet, Take 81 mg by mouth daily. Swallow whole., Disp: , Rfl:    clonazePAM (KLONOPIN) 1 MG tablet, Take 0.5-1 mg by mouth 2 (two) times daily as needed for anxiety., Disp: , Rfl:    clopidogrel  (PLAVIX ) 75 MG tablet, TAKE 1 TABLET(75 MG) BY MOUTH DAILY, Disp: 90 tablet, Rfl: 1   diltiazem  (CARDIZEM  CD) 180 MG 24 hr capsule, TAKE 1 CAPSULE(180 MG) BY MOUTH DAILY, Disp: 90 capsule, Rfl: 2   LINZESS 145 MCG CAPS capsule, Take 145 mcg by mouth daily as needed (Constipation)., Disp: , Rfl:    nitroGLYCERIN  (NITROSTAT ) 0.4 MG SL tablet, Place 1 tablet (0.4 mg total) under the tongue every 5 (five) minutes as needed for chest pain., Disp: 30 tablet, Rfl: 0   REPATHA  SURECLICK 140 MG/ML SOAJ, ADMINISTER 1 ML UNDER THE SKIN EVERY 14 DAYS, Disp: 2 mL, Rfl: 12   Study - [name] - olpasiran (AMG 890) 142 mg/mL or placebo SQ injection (PI-Hilty), Inject 142 mg into the skin once. For Investigational Use Only. Inject 1 mL (1 prefilled syringe)  subcutaneously into appropriate injection site per protocol every 12 weeks. (Approved injection site(s): upper arm, upper thigh & abdomen). Please contact [facility] for any questions or concerns regarding this medication., Disp: , Rfl:    traZODone  (DESYREL ) 50 MG tablet, Take 50 mg by mouth at bedtime., Disp: , Rfl:   Current Facility-Administered Medications:    Study - [name] - olpasiran (AMG 890) 142 mg/mL or placebo SQ injection (PI-Hilty), 142 mg, Subcutaneous, Once,

## 2024-05-05 ENCOUNTER — Telehealth: Payer: Self-pay | Admitting: Cardiology

## 2024-05-05 DIAGNOSIS — I25118 Atherosclerotic heart disease of native coronary artery with other forms of angina pectoris: Secondary | ICD-10-CM

## 2024-05-05 DIAGNOSIS — E7841 Elevated Lipoprotein(a): Secondary | ICD-10-CM

## 2024-05-05 DIAGNOSIS — E7800 Pure hypercholesterolemia, unspecified: Secondary | ICD-10-CM

## 2024-05-05 MED ORDER — REPATHA SURECLICK 140 MG/ML ~~LOC~~ SOAJ
140.0000 mg | SUBCUTANEOUS | 0 refills | Status: DC
Start: 2024-05-05 — End: 2024-05-15

## 2024-05-05 NOTE — Telephone Encounter (Signed)
 Pt c/o medication issue:  1. Name of Medication: REPATHA  SURECLICK 140 MG/ML SOAJ   2. How are you currently taking this medication (dosage and times per day)?   ADMINISTER 1 ML UNDER THE SKIN EVERY 14 DAYS    3. Are you having a reaction (difficulty breathing--STAT)? No  4. What is your medication issue? Pharmacy states that manufacture has formulation to latex free. They are needing new script sent. Please advise

## 2024-05-05 NOTE — Telephone Encounter (Signed)
 New Prescription for Repatha  sent to [facility] in [place].

## 2024-05-11 ENCOUNTER — Other Ambulatory Visit (HOSPITAL_COMMUNITY): Payer: Self-pay

## 2024-05-11 ENCOUNTER — Telehealth: Payer: Self-pay | Admitting: Pharmacy Technician

## 2024-05-11 NOTE — Telephone Encounter (Signed)
 Pharmacy Patient Advocate Encounter   Received notification from Fax that prior authorization for repatha  is required/requested.   Insurance verification completed.   The patient is insured through [employer] .   Per test claim: The current 05/11/24 day co-pay is, $4.00.  No PA needed at this time. This test claim was processed through [facility]- copay amounts may vary at other pharmacies due to pharmacy/plan contracts, or as the patient moves through the different stages of their insurance plan.

## 2024-05-12 ENCOUNTER — Telehealth: Payer: Self-pay | Admitting: Cardiology

## 2024-05-12 ENCOUNTER — Other Ambulatory Visit (HOSPITAL_COMMUNITY): Payer: Self-pay

## 2024-05-12 NOTE — Telephone Encounter (Signed)
 Pt c/o medication issue:  1. Name of Medication:  Repatha    2. How are you currently taking this medication (dosage and times per day)?   3. Are you having a reaction (difficulty breathing--STAT)?   4. What is your medication issue?   Per [employer], manufacturer changed formula to be Latex free with new NDC number. New PA is needed with new NDC #, [phone].

## 2024-05-12 NOTE — Telephone Encounter (Signed)
 Pt calling in regards to an updated authorization and new prescription for Repatha . Her insurance info has changed. Please advise.

## 2024-05-15 ENCOUNTER — Other Ambulatory Visit: Payer: Self-pay

## 2024-05-15 DIAGNOSIS — E7800 Pure hypercholesterolemia, unspecified: Secondary | ICD-10-CM

## 2024-05-15 DIAGNOSIS — E7841 Elevated Lipoprotein(a): Secondary | ICD-10-CM

## 2024-05-15 DIAGNOSIS — I25118 Atherosclerotic heart disease of native coronary artery with other forms of angina pectoris: Secondary | ICD-10-CM

## 2024-05-15 MED ORDER — REPATHA SURECLICK 140 MG/ML ~~LOC~~ SOAJ
140.0000 mg | SUBCUTANEOUS | 3 refills | Status: AC
Start: 2024-05-15 — End: ?

## 2024-05-15 NOTE — Telephone Encounter (Signed)
 Called the patient and verified that we had her correct insurance and sent in a re-fill for the patient's Repatha . Patient verbalized understanding and had no further questions at this time.

## 2024-05-25 DIAGNOSIS — F418 Other specified anxiety disorders: Secondary | ICD-10-CM | POA: Insufficient documentation

## 2024-05-25 NOTE — Progress Notes (Unsigned)
 Cardiology Office Note:    Date:  05/26/2024   ID:  Shira, Bobst 1961-01-11, MRN [id number]  PCP:  Montey Lot, PA-C  Cardiologist:  Redell Leiter, MD    Referring MD: Montey Lot, PA-C    ASSESSMENT:    1. Coronary artery disease of native artery of native heart with stable angina pectoris    PLAN:    In order of problems listed above:  Gaynell continues to do well with CAD stable and frequent angina we will continue her long-term dual antiplatelet therapy along with Repatha  plus invasive location of the drug and as needed nitroglycerin  Continue current lipid-lowering therapy plus investigational Lpa Managed by vascular surgery   Next appointment: 1 year   Medication Adjustments/Labs and Tests Ordered: Current medicines are reviewed at length with the patient today.  Concerns regarding medicines are outlined above.  Orders Placed This Encounter  Procedures   EKG 12-Lead   No orders of the defined types were placed in this encounter.    History of Present Illness:    Monserrath Junio is a 63 y.o. female with a hx of CAD with PCI and stent right coronary artery January 2022 familial hyperlipidemia with elevated LP(a) and carotid stenosis followed by vascular surgery [place] last seen 09/30/2023.  She is in a research study for LP(a) [place] a study and also takes Repatha  for dyslipidemia.  Compliance with diet, lifestyle and medications: Yes  She continues in LP(a) occlusion history without side effects Her most recent lipid profile and Repatha  plus investigational drugs quite reassuring cholesterol 162 LDL 77 non-HDL cholesterol 99 A1c 5.5 hemoglobin 14.7 creatinine 0.97 She has had 1 episode of angina relieved with nitroglycerin  Arrives feels well no edema shortness of breath orthopnea palpitation or syncope Past Medical History:  Diagnosis Date   Abnormal cardiac CT angiography 09/06/2020   Anxiety disorder    Benign essential tremor     CAD (coronary artery disease)    Carotid artery occlusion 11/09/2020   Carotid stenosis, asymptomatic, left 02/26/2023   Complication of anesthesia    Hard time waking up and BP drops   Constipation    Coronary artery disease involving native coronary artery of native heart with angina pectoris    Depression    Depression with anxiety    Family history of adverse reaction to anesthesia    Mother had same reaction to anesthesia   Headache    Hx Migraines   Hematuria    History of kidney stones    Hypercholesteremia    Peripheral vascular disease    Left Carotid Stenosis   Peripheral vascular disease of extremity    PONV (postoperative nausea and vomiting)    Progressive angina (HCC) 09/06/2020   Stroke (HCC) 10/2020   TIA   TIA (transient ischemic attack) 11/08/2020   Weight loss, abnormal     Current Medications: Current Meds  Medication Sig   aspirin  EC 81 MG tablet Take 81 mg by mouth daily. Swallow whole.   clonazePAM (KLONOPIN) 1 MG tablet Take 0.5-1 mg by mouth 2 (two) times daily as needed for anxiety.   clopidogrel  (PLAVIX ) 75 MG tablet TAKE 1 TABLET(75 MG) BY MOUTH DAILY   diltiazem  (CARDIZEM  CD) 180 MG 24 hr capsule TAKE 1 CAPSULE(180 MG) BY MOUTH DAILY   Evolocumab  (REPATHA  SURECLICK) 140 MG/ML SOAJ Inject 140 mg into the skin every 14 (fourteen) days.   LINZESS 145 MCG CAPS capsule Take 145 mcg by mouth daily as needed (Constipation).  nitroGLYCERIN  (NITROSTAT ) 0.4 MG SL tablet Place 1 tablet (0.4 mg total) under the tongue every 5 (five) minutes as needed for chest pain.   Study - [name] - olpasiran (AMG 890) 142 mg/mL or placebo SQ injection (PI-Hilty) Inject 142 mg into the skin once. For Investigational Use Only. Inject 1 mL (1 prefilled syringe) subcutaneously into appropriate injection site per protocol every 12 weeks. (Approved injection site(s): upper arm, upper thigh & abdomen). Please contact [facility] for any questions or concerns  regarding this medication.   traZODone  (DESYREL ) 50 MG tablet Take 50 mg by mouth at bedtime.      EKGs/Labs/Other Studies Reviewed:    The following studies were reviewed today:  Cardiac Studies & Procedures   ______________________________________________________________________________________________ CARDIAC CATHETERIZATION  CARDIAC CATHETERIZATION 09/06/2020  Conclusion  CULPRIT LESION: Prox RCA lesion is 80% stenosed.  A drug-eluting stent was successfully placed using a STENT RESOLUTE ONYX 2.0X22. Postdilated 2.3 mm  Post intervention, there is a 0% residual stenosis.  Ost RCA lesion is 50% stenosed -noted to be significant after initial stent placement  A drug-eluting stent was successfully placed from the ostium overlapping previous stent, using a STENT RESOLUTE ONYX 2.25X8. Postdilated to 2.4 mm  Post intervention, there is a 0% residual stenosis.  -----------------  -----------------  Lida LM to Mid LM lesion is 40% stenosed.  The left ventricular systolic function is normal. The left ventricular ejection fraction is 55-65% by visual estimate.  LV end diastolic pressure is normal.  SUMMARY  Severe single-vessel disease involving the ostial and proximal RCA (50% - 80% stenoses -> positive by DFR = 0.86-0.88)  Progress successful DES PCI of the ostial-proximal RCA using 2 overlapping Resolute Onyx DES Stents (2.0 mm x 22 mm, 2.25 mm x 8 mm -> postdilated to 2.5-2.6 mm).  Moderate 40% proximal Left Main at a bend.  Otherwise relatively small caliber left system.  Preserved EF of 55% with normal LVEDP.   RECOMMENDATIONS  Okay for standing discharge  Continue Repatha , will add low-dose carvedilol   Continue risk factor modification  Follow-up with Dr. Monetta Alm Clay, MD  Findings Coronary Findings Diagnostic  Dominance: Right  Left Main Ost LM to Mid LM lesion is 40% stenosed. The lesion is located at the bend.  Left Anterior  Descending Vessel is small. The vessel exhibits minimal luminal irregularities.  First Diagonal Branch Vessel is small in size.  First Septal Branch Vessel is small in size.  Second Diagonal Branch Vessel is small in size.  Lateral Second Diagonal Branch Vessel is small in size.  Third Diagonal Branch Vessel is small in size.  Left Circumflex Vessel is small.  First Obtuse Marginal Branch Vessel is small in size.  Left Posterior Atrioventricular Artery Vessel is small in size.  Right Coronary Artery Vessel is small. Ost RCA lesion is 50% stenosed. Prox RCA lesion is 80% stenosed. Vessel is the culprit lesion. The lesion is located at the bend, segmental, eccentric and irregular. Pressure wire/FFR was performed on the lesion. DFR 0.86-0.88 (SIGNIFICANT)  Acute Marginal Branch Vessel is small in size.  Right Ventricular Branch Vessel is small in size.  Right Posterior Descending Artery Vessel is small in size.  Right Posterior Atrioventricular Artery Vessel is small in size.  First Right Posterolateral Branch Vessel is small in size.  Intervention  Ost RCA lesion Stent Lesion length:  4 mm. Lesion crossed with guidewire using a GUIDEWIRE PRESSURE COMET II. Pre-stent angioplasty was not performed. A drug-eluting  stent was successfully placed using a STENT RESOLUTE ONYX 2.25X8. Maximum pressure: 20 atm. Inflation time: 30 sec. Minimum lumen area:  2.4 mm. Stent strut is well apposed. Stent overlaps previously placed stent. Post-stent angioplasty was performed using a BALLOON SAPPHIRE [place] 2.25X15. The ostium was flared to high ATM with stent balloon & [place] balloon Post-Intervention Lesion Assessment The intervention was successful. Pre-interventional TIMI flow is 3. Post-intervention TIMI flow is 3. Treated lesion length:  4 mm. No complications occurred at this lesion. There is a 0% residual stenosis post intervention.  Prox RCA lesion Stent Lesion length:  20 mm.  CATH LAUNCHER 33F JR4 guide catheter was inserted. Lesion crossed with guidewire using a GUIDEWIRE PRESSURE COMET II. Pre-stent angioplasty was performed using a BALLOON SAPPHIRE 2.0X15. Maximum pressure:  8 atm. Inflation time:  20 sec. BALLOON SAPPHIRE 1.5X12 - 8 Atm x 20 Sec A drug-eluting stent was successfully placed using a STENT RESOLUTE ONYX 2.0X22. Maximum pressure: 16 atm. Inflation time: 20 sec. Minimum lumen area:  2.5 mm. Stent strut is well apposed. Post-stent angioplasty was performed using a BALLOON SAPPHIRE 2.0X15. Post-Intervention Lesion Assessment The intervention was successful. Pre-interventional TIMI flow is 3. Post-intervention TIMI flow is 3. Treated lesion length:  22 mm. No complications occurred at this lesion. Cross-sectional area: 2.3 mm. On review post PCI images, it became clear that the ostium was more significant than initially felt.  A second stent was then placed. There is a 0% residual stenosis post intervention.     ECHOCARDIOGRAM  ECHOCARDIOGRAM COMPLETE 11/09/2020  Narrative ECHOCARDIOGRAM REPORT    Patient Name:   SYANNA REMMERT Korn Date of Exam: 11/09/2020 Medical Rec #:  [id number]             Height:       63.0 in Accession #:    [id number]            Weight:       143.0 lb Date of Birth:  [birth year]             BSA:          1.677 m Patient Age:    59 years              BP:           124/94 mmHg Patient Gender: F                     HR:           57 bpm. Exam Location:  Inpatient  Procedure: 2D Echo, Cardiac Doppler, Color Doppler and Strain Analysis  Indications:    CVA  History:        Patient has no prior history of Echocardiogram examinations. CAD, TIA; Risk Factors:Dyslipidemia.  Sonographer:    EMMIE DEW RDCS Referring Phys: [id number] MOHAMMAD L GARBA  IMPRESSIONS   1. Left ventricular ejection fraction, by estimation, is 60 to 65%. The left ventricle has normal function. The left ventricle has no regional wall motion  abnormalities. Left ventricular diastolic parameters are consistent with Grade I diastolic dysfunction (impaired relaxation). 2. Right ventricular systolic function is normal. The right ventricular size is normal. Tricuspid regurgitation signal is inadequate for assessing PA pressure. 3. The mitral valve is grossly normal. Trivial mitral valve regurgitation. No evidence of mitral stenosis. 4. The aortic valve was not well visualized. Aortic valve regurgitation is not visualized. No aortic stenosis is present. 5. The inferior vena cava is normal in size with  greater than 50% respiratory variability, suggesting right atrial pressure of 3 mmHg.  Comparison(s): No prior Echocardiogram.  Conclusion(s)/Recommendation(s): No intracardiac source of embolism detected on this transthoracic study. A transesophageal echocardiogram is recommended to exclude cardiac source of embolism if clinically indicated.  FINDINGS Left Ventricle: Left ventricular ejection fraction, by estimation, is 60 to 65%. The left ventricle has normal function. The left ventricle has no regional wall motion abnormalities. The left ventricular internal cavity size was normal in size. There is no left ventricular hypertrophy. Left ventricular diastolic parameters are consistent with Grade I diastolic dysfunction (impaired relaxation).  Right Ventricle: The right ventricular size is normal. No increase in right ventricular wall thickness. Right ventricular systolic function is normal. Tricuspid regurgitation signal is inadequate for assessing PA pressure.  Left Atrium: Left atrial size was normal in size.  Right Atrium: Right atrial size was normal in size.  Pericardium: Trivial pericardial effusion is present. Presence of pericardial fat pad.  Mitral Valve: The mitral valve is grossly normal. Trivial mitral valve regurgitation. No evidence of mitral valve stenosis.  Tricuspid Valve: The tricuspid valve is grossly normal. Tricuspid  valve regurgitation is not demonstrated. No evidence of tricuspid stenosis.  Aortic Valve: The aortic valve was not well visualized. Aortic valve regurgitation is not visualized. No aortic stenosis is present. Aortic valve mean gradient measures 4.0 mmHg. Aortic valve peak gradient measures 8.4 mmHg. Aortic valve area, by VTI measures 1.40 cm.  Pulmonic Valve: The pulmonic valve was grossly normal. Pulmonic valve regurgitation is not visualized. No evidence of pulmonic stenosis.  Aorta: The aortic root is normal in size and structure.  Venous: The inferior vena cava is normal in size with greater than 50% respiratory variability, suggesting right atrial pressure of 3 mmHg.  IAS/Shunts: The atrial septum is grossly normal.   LEFT VENTRICLE PLAX 2D LVIDd:         5.40 cm     Diastology LVIDs:         3.90 cm     LV e' medial:    6.96 cm/s LV PW:         0.80 cm     LV E/e' medial:  10.6 LV IVS:        0.80 cm     LV e' lateral:   6.96 cm/s LVOT diam:     1.60 cm     LV E/e' lateral: 10.6 LV SV:         45 LV SV Index:   27          2D Longitudinal Strain LVOT Area:     2.01 cm    2D Strain GLS Avg:     -19.7 %  LV Volumes (MOD) LV vol d, MOD A2C: 36.9 ml LV vol d, MOD A4C: 40.6 ml LV vol s, MOD A2C: 12.7 ml LV vol s, MOD A4C: 14.9 ml LV SV MOD A2C:     24.2 ml LV SV MOD A4C:     40.6 ml LV SV MOD BP:      24.8 ml  RIGHT VENTRICLE RV S prime:     8.70 cm/s  PULMONARY VEINS TAPSE (M-mode): 1.4 cm     A Reversal Duration: 109.00 msec A Reversal Velocity: 43.70 cm/s Diastolic Velocity:  40.30 cm/s S/D Velocity:        1.40 Systolic Velocity:   55.30 cm/s  LEFT ATRIUM             Index  RIGHT ATRIUM           Index LA diam:        3.50 cm 2.09 cm/m  RA Area:     10.40 cm LA Vol (A2C):   44.3 ml 26.42 ml/m RA Volume:   26.00 ml  15.51 ml/m LA Vol (A4C):   35.5 ml 21.17 ml/m LA Biplane Vol: 40.6 ml 24.21 ml/m AORTIC VALVE                   PULMONIC VALVE AV Area  (Vmax):    1.20 cm    PV Vmax:       0.97 m/s AV Area (Vmean):   1.19 cm    PV Vmean:      71.700 cm/s AV Area (VTI):     1.40 cm    PV VTI:        0.328 m AV Vmax:           145.00 cm/s PV Peak grad:  3.8 mmHg AV Vmean:          96.900 cm/s PV Mean grad:  2.0 mmHg AV VTI:            0.320 m AV Peak Grad:      8.4 mmHg AV Mean Grad:      4.0 mmHg LVOT Vmax:         86.20 cm/s LVOT Vmean:        57.200 cm/s LVOT VTI:          0.223 m LVOT/AV VTI ratio: 0.70  AORTA Ao Root diam: 2.80 cm Ao Asc diam:  2.20 cm  MITRAL VALVE MV Area (PHT): 2.99 cm     SHUNTS MV Decel Time: 254 msec     Systemic VTI:  0.22 m MV E velocity: 73.70 cm/s   Systemic Diam: 1.60 cm MV A velocity: 102.00 cm/s MV E/A ratio:  0.72  Darryle Decent MD Electronically signed by Darryle Decent MD Signature Date/Time: 11/09/2020/12:55:42 PM    Final      CT SCANS  CT CORONARY FRACTIONAL FLOW RESERVE DATA PREP 08/26/2020  Narrative EXAM: FFRCT ANALYSIS  FINDINGS: FFRct analysis was performed on the original cardiac CT angiogram dataset. Diagrammatic representation of the FFRct analysis is provided in a separate PDF document in PACS. This dictation was created using the PDF document and an interactive 3D model of the results. 3D model is not available in the EMR/PACS. Normal FFR range is >0.80.  1. Left Main: No significant stenosis. LM FFR = 0.89  2. LAD: No significant focal stenosis. Proximal FFR = 0.87, Mid FFR = 0.82, Distal FFR = 0.72.  3. LCX: No significant stenosis. Proximal FFR = 0.86, MId FFR = 0.85, Distal FFR could not be calculated.  4. RCA: Possible significant stenosis in proximal RCA. Proximal FFR = 0. 96, Mid FFR = 0.68, Distal FFR = 0.55.  IMPRESSION: 1. Coronary CTA FFR flow analysis demonstrates possible hemodynamically flow limiting lesion in the proximal RCA. The proximal FFR drops from 0.96 to 0.68 before the mid RCA consistent with significant stenosis. There is  gradual decrease in flow in the mid and distal LAD consistent with tapering of vessel and no focal stenosis.  2.  Recommend Cardiac Catheterization.  Wilbert Turner   Electronically Signed By: Wilbert Bihari On: 08/28/2020 20:50   CT CORONARY MORPH W/CTA COR W/SCORE 08/26/2020  Addendum 08/26/2020  1:25 PM ADDENDUM REPORT: 08/26/2020 13:23  EXAM: Cardiac/Coronary  CT  TECHNIQUE:  The patient was scanned on a [employer].  FINDINGS: A 120 kV prospective scan was triggered in the descending thoracic aorta at 111 HU's. Axial non-contrast 3 mm slices were carried out through the heart. The data set was analyzed on a dedicated work station and scored using the Agatson method. Gantry rotation speed was 250 msecs and collimation was .6 mm. No beta blockade and 0.8 mg of sl NTG was given. The 3D data set was reconstructed in 5% intervals of the 67-82 % of the R-R cycle. Diastolic phases were analyzed on a dedicated work station using MPR, MIP and VRT modes. The patient received 80 cc of contrast.  Quality: Good images but study limited by Noise and breathing artifact  Aorta: Normal size. Scattered calcifications of the ascending and descending aorta and aortic arch. No dissection.  Aortic Valve:  Trileaflet.  No calcifications.  Coronary Arteries:  Normal coronary origin.  Right dominance.  RCA is a large dominant artery that gives rise to PDA and PLVB. The RCA is diffusely diseased with mild to moderate calcified plaque in the proximal, mid and distal RCA with associated stenosis of 25-49% in the proximal and distal RCA and possibly as high as 50-69% in the mid RCA.  Left main is a large artery that gives rise to LAD, Ramus and LCX arteries. There is mixed plaque throughout the LM with associated stenosis of 25-49% but could be as high as 50%. There is motion artifact that limits accurate assessment.  LAD is a large vessel that gives rise to a large D1. There  is minimal calcified plaque in the proximal LAD with associated stenosis of 1-24%. There is mild calcified plaque in the mid LAD after the takeoff of D1 with associated stenosis of 25-49% followed non calcified plaque with 25-49% stenosis. There is mild calcified plaque in the proximal and mid D1 with associated stenosis of 25-49%.  LCX is a non-dominant artery that gives rise to one large OM1 branch. There is minimal calcified plaque in the mid LCx at the takeoff of the OM1 with associated stenosis of 1-24%.  Other findings:  Normal pulmonary vein drainage into the left atrium.  Normal let atrial appendage without a thrombus.  Normal size of the pulmonary artery.  IMPRESSION: 1. Coronary calcium score of 278. This was 98th percentile for age and sex matched control.  2.  Normal coronary origin with right dominance.  3. Mild to moderate atherosclerosis. CAD RADs 2. The LM is not adequately visualized due to motion artifact. There is at least 25-49% stenosis but may be higher.  4. Atherosclerosis of the ascending and descending aorta and aortic root.  5. Recommend Preventive therapy and aggressive risk factor modification.  6. Consider nuclear stress testing vs. Repeat coronary CTA to assess left main further.  7.  This study has been submitted for FFR flow analysis.  Wilbert Bihari   Electronically Signed By: Wilbert Bihari On: 08/26/2020 13:23  Narrative EXAM: OVER-READ INTERPRETATION  CT CHEST  The following report is an over-read performed by radiologist Dr. Franky Crease of [facility] on 08/26/2020. This over-read does not include interpretation of cardiac or coronary anatomy or pathology. The coronary CTA interpretation by the cardiologist is attached.  COMPARISON:  None.  FINDINGS: Vascular: Heart is normal size. Aorta normal caliber. Scattered aortic calcifications.  Mediastinum/Nodes: No adenopathy  Lungs/Pleura: No confluent  opacities or effusions.  Upper Abdomen: Imaging into the upper abdomen demonstrates no acute findings.  Musculoskeletal: Chest  wall soft tissues are unremarkable. No acute bony abnormality.  IMPRESSION: Aortic atherosclerosis.  No acute extra cardiac abnormality.  Electronically Signed: By: Franky Crease M.D. On: 08/26/2020 10:33     ______________________________________________________________________________________________      EKG Interpretation Date/Time:  Friday May 26 2024 10:25:48 EDT Ventricular Rate:  67 PR Interval:  124 QRS Duration:  78 QT Interval:  376 QTC Calculation: 397 R Axis:   26  Text Interpretation: Normal sinus rhythm Nonspecific T wave abnormality When compared with ECG of 08-Nov-2020 17:11, No significant change was found Confirmed by Monetta Rogue ([id number]) on 05/26/2024 10:28:48 AM    Recent Lipid Panel    Component Value Date/Time   CHOL 180 09/30/2023 0822   TRIG 156 (H) 09/30/2023 0822   HDL 59 09/30/2023 0822   CHOLHDL 3.1 09/30/2023 0822   CHOLHDL 2.3 02/27/2023 0540   VLDL 23 02/27/2023 0540   LDLCALC 94 09/30/2023 0822   EKG Interpretation Date/Time:  Friday May 26 2024 10:25:48 EDT Ventricular Rate:  67 PR Interval:  124 QRS Duration:  78 QT Interval:  376 QTC Calculation: 397 R Axis:   26  Text Interpretation: Normal sinus rhythm Nonspecific T wave abnormality When compared with ECG of 08-Nov-2020 17:11, No significant change was found Confirmed by Monetta Rogue ([id number]) on 05/26/2024 10:28:48 AM   Physical Exam:    VS:  BP 122/70   Pulse 67   Ht 5' 3 (1.6 m)   Wt 145 lb 3.2 oz (65.9 kg)   SpO2 99%   BMI 25.72 kg/m     Wt Readings from Last 3 Encounters:  05/26/24 145 lb 3.2 oz (65.9 kg)  04/18/24 143 lb (64.9 kg)  01/17/24 143 lb 9.6 oz (65.1 kg)     GEN:  Well nourished, well developed in no acute distress HEENT: Normal NECK: No JVD; No carotid bruits LYMPHATICS: No lymphadenopathy CARDIAC: RRR,  no murmurs, rubs, gallops RESPIRATORY:  Clear to auscultation without rales, wheezing or rhonchi  ABDOMEN: Soft, non-tender, non-distended MUSCULOSKELETAL:  No edema; No deformity  SKIN: Warm and dry NEUROLOGIC:  Alert and oriented x 3 PSYCHIATRIC:  Normal affect    Signed, Rogue Monetta, MD  05/26/2024 10:29 AM    [facility]

## 2024-05-26 ENCOUNTER — Encounter: Payer: Self-pay | Admitting: Cardiology

## 2024-05-26 ENCOUNTER — Ambulatory Visit: Attending: Cardiology | Admitting: Cardiology

## 2024-05-26 VITALS — BP 122/70 | HR 67 | Ht 63.0 in | Wt 145.2 lb

## 2024-05-26 DIAGNOSIS — E7841 Elevated Lipoprotein(a): Secondary | ICD-10-CM | POA: Insufficient documentation

## 2024-05-26 DIAGNOSIS — I6523 Occlusion and stenosis of bilateral carotid arteries: Secondary | ICD-10-CM | POA: Insufficient documentation

## 2024-05-26 DIAGNOSIS — I25118 Atherosclerotic heart disease of native coronary artery with other forms of angina pectoris: Secondary | ICD-10-CM | POA: Insufficient documentation

## 2024-05-26 DIAGNOSIS — Z9889 Other specified postprocedural states: Secondary | ICD-10-CM | POA: Diagnosis present

## 2024-05-26 DIAGNOSIS — E7800 Pure hypercholesterolemia, unspecified: Secondary | ICD-10-CM | POA: Diagnosis present

## 2024-05-26 NOTE — Patient Instructions (Signed)

## 2024-06-13 LAB — COMPREHENSIVE METABOLIC PANEL WITH GFR: EGFR: 70

## 2024-06-21 ENCOUNTER — Encounter: Admitting: *Deleted

## 2024-06-21 VITALS — BP 150/68 | HR 63 | Temp 97.0°F | Resp 18 | Wt 147.3 lb

## 2024-06-21 DIAGNOSIS — Z006 Encounter for examination for normal comparison and control in clinical research program: Secondary | ICD-10-CM

## 2024-06-21 MED ORDER — STUDY - OCEAN(A) - OLPASIRAN (AMG 890) 142 MG/ML OR PLACEBO SQ INJECTION (PI-HILTY)
142.0000 mg | PREFILLED_SYRINGE | Freq: Once | SUBCUTANEOUS | Status: AC
  Administered 2024-06-21: 142 mg via SUBCUTANEOUS
  Filled 2024-06-21: qty 1

## 2024-06-21 NOTE — Progress Notes (Signed)
 Patient seen today for follow up in [place] a trial. Overall doing well, no complaints.    Vitals BP: 150/68 HR: 63 Weight: 147lbs  EKG: sinus rhythm 62 bpm  General: Well developed, well nourished, female appearing in no acute distress. Head: Normocephalic, atraumatic.  Neck: Supple without bruits, JVD. Lungs:  Resp regular and unlabored, CTA. Heart: RRR, S1, S2, no S3, S4, or murmur; no rub. Abdomen: Soft, non-tender, non-distended  Extremities: No clubbing, cyanosis, edema.  Neuro: Alert and oriented X 3. Moves all extremities spontaneously. Psych: Normal affect.  [place] a study trial  Plan: no issues, continue in study  Bonney Manuelita Rummer, NP-C 06/21/2024, 9:14 AM Pager: [phone]

## 2024-06-21 NOTE — Research (Signed)
 [place]  Week 96  Vitals: [x]  Experience any AE/SAE/Hospitalizations []  Yes [x]  No  If yes please explain:  EKG: 0902 MD to see: LR Labs collected:  0907   Discussed with patient about the importance of not letting any one draw cholesterol or lipids. As to we are blinded to results. Reassured patient if we needed to be contacted the study team would reach out to our unblinded person.   No labs at next visit 108.    Non-Fatal Potential Endpoint Assessment Yes  No   Has the subject experienced/undergone any of the following since the last visit/contact?   []   [x]    Any Coronary Artery Revascularization/Cerebrovascular Revascularization/ Peripheral Artery Revascularization/Amputation Procedure   []   [x]    Myocardial Infarction []   [x]    Stroke   []   [x]    Provide the date for the non-fatal Potential Endpoints status as of today visit date    []   [x]     IP admin please see MAR (please add Box # to comment section on MAR) [x]   Current Outpatient Medications:    aspirin  EC 81 MG tablet, Take 81 mg by mouth daily. Swallow whole., Disp: , Rfl:    clonazePAM (KLONOPIN) 1 MG tablet, Take 0.5-1 mg by mouth 2 (two) times daily as needed for anxiety., Disp: , Rfl:    clopidogrel  (PLAVIX ) 75 MG tablet, TAKE 1 TABLET(75 MG) BY MOUTH DAILY, Disp: 90 tablet, Rfl: 1   diltiazem  (CARDIZEM  CD) 180 MG 24 hr capsule, TAKE 1 CAPSULE(180 MG) BY MOUTH DAILY, Disp: 90 capsule, Rfl: 2   Evolocumab  (REPATHA  SURECLICK) 140 MG/ML SOAJ, Inject 140 mg into the skin every 14 (fourteen) days., Disp: 6 mL, Rfl: 3   LINZESS 145 MCG CAPS capsule, Take 145 mcg by mouth daily as needed (Constipation)., Disp: , Rfl:    nitroGLYCERIN  (NITROSTAT ) 0.4 MG SL tablet, Place 1 tablet (0.4 mg total) under the tongue every 5 (five) minutes as needed for chest pain., Disp: 30 tablet, Rfl: 0   Study - [name] - olpasiran (AMG 890) 142 mg/mL or placebo SQ injection (PI-Hilty), Inject 142 mg into the skin once. For  Investigational Use Only. Inject 1 mL (1 prefilled syringe) subcutaneously into appropriate injection site per protocol every 12 weeks. (Approved injection site(s): upper arm, upper thigh & abdomen). Please contact [facility] for any questions or concerns regarding this medication., Disp: , Rfl:    traZODone  (DESYREL ) 50 MG tablet, Take 50 mg by mouth at bedtime., Disp: , Rfl:   Current Facility-Administered Medications:    Study - [name] - olpasiran (AMG 890) 142 mg/mL or placebo SQ injection (PI-Hilty), 142 mg, Subcutaneous, Once,

## 2024-06-23 NOTE — Research (Addendum)
 Are there any labs that are clinically significant?  Yes []  OR No[x]   Please FORWARD back to me with any changes or follow up!  Alyssa KYM Maxcy, MD, [facility], FNLA, FACP    [facility]  Medical Director of the Advanced Lipid Disorders &  Cardiovascular Risk Reduction Clinic Diplomate of the American Board of Clinical Lipidology Attending Cardiologist  Direct Dial: [phone]  Fax: [phone]  Website:  [email or web address]      ACCESSION NO. [id number]                                             Page 1 of 1                                                        INVESTIGATOR: ([id number])                          PROTOCOL   [id number]                     Alyssa Ritter M.D.                             INVESTIGATOR NO.: [id number]                     c/o Reena Lies                              SUBJECT ID: [id number]                     [facility]                 SUBJECT INITIALS NOT COLLECTED:                     [address] [place] [address]                 VISIT: Alyssa Ritter, [place]  [id number]                   SPONSOR REPORT TO:                 COLLECTION TIME:09:07 DATE:21-Jun-2024                     Leora Shoe                      DATE RECEIVED IN LABORATORY: 22-Jun-2024                     c/o Sponsor(or Addtl) El[employer].Study DATE REPORTED BY LABORATORY: 23-Jun-2024                     [facility]  SEX: F  BIRTHDATE:  01-Mar-1961    AGE: 40b                     [address]                  SELMA NO.: [phone]                   [place], [place]  [phone]                                                                                        Ref. Ranges               Clinical    Comments                                                                          Significance                                                                            Yes*  No                    DECREASE >/=  EGFR 50%                      eGFR > 50%     Criteria not met                      ALT > 3 X ULN                      ALT>3XULN      Criteria not met                                        ALT > 5 X ULN                      ALT>5XULN      Criteria not met                                        ALT > 8 X ULN                      ALT>8XULN      Criteria not met  ALT & TBIL                      ALT & TBIL     Criteria not met                                        AST > 3 X ULN                      AST>3XULN      Criteria not met                                        AST > 5 X ULN                      AST>5XULN      Criteria not met                                        AST > 8 X ULN                      AST>8XULN      Criteria not met                                        AST & TBIL                      AST & TBIL     Criteria not met                          SM [id number] ANTIBODY COLL D/T                      CDate PreD     21-Jun-2024                                               CTime PreD     09:07                                                   SM [id number] LPA COLLECTION D/T                      Coll Date      To follow                                                Coll Time      To follow  COAGULATION GROUP                      APTT           To follow                                                PT             To follow                                                INR            To follow                                              ALT & INR                      ALT & INR      To follow                                              AST & INR                      AST & INR      To follow                    CHEMISTRY PANEL                      Total Bili     <0.2    L    0.2-1.2 mg/dL   95/70/7974 = <9.7                                            D-BilGen2      <0.08        0.00-0.36 mg/dL                               Ind Bili       <0.2         0.0-1.2 mg/dL                                Alk Phos       119     H    35-104 U/L    12/28/2023 = 127                                 ALT (SGPT)     8            4-43 U/L  AST (SGOT)     19           8-40 U/L                                    Urea Nitr      15           4-24 mg/dL                                   Creatinine     0.94         0.35-1.14 mg/dL                              Calcium        10.2         8.3-10.6 mg/dL                              Total Prot     7.4          6.0-8.0 g/dL                                 Alb BCG        4.5          3.3-4.9 g/dL                                 CK             74           26-192 U/L                                   Sodium         141          135-145 mEq/L                                Potassium      4.1          3.5-5.2 mEq/L                                Bicarb         20.2         19.3-29.3 mEq/L                              Chloride       103          94-112 mEq/L                               ADJUSTED CALCIUM                      Adj Calc       10.2  8.3-10.6 mg/dL                    HEMATOLOGY&DIFFERENTIAL PANEL                      HGB            14.4         11.5-15.8 g/dL                              HCT            42           34-48 %                                      RBC            4.9          3.9-5.5 x106/uL                              MCH            29           26-34 pg                                    MCHC           34           31-38 g/dL                                   RDW            13.2         12.0-15.0 %                                 RBC Morph      No Review Required                                       MCV            86           80-100 fL                                    WBC            8.24         3.80-10.70 x103/uL                           Neutrophil     4.11         1.96-7.23 x103/uL                            Lymphocyte     3.41    H    0.80-3.00 x103/uL  12/28/2023 = 3.56                                    Monocytes      0.51         0.12-0.92 x103/uL                           Eosinophil     0.14         0.00-0.57 x103/uL                           Basophils      0.07         0.00-0.20 x103/uL                           Neutrophil     49.8         40.5-75.0 %                                 Lymphocyte     41.3         15.4-48.5%                                   Monocytes      6.2          2.6-10.1 %                                   Eosinophil     1.7          0.0-6.8 %                                    Basophils      0.9          0.0-2.0 %                                    Platelets      338          130-394 x103/uL                            ANC                      ANC            4.11         1.96-7.23 x103/uL                      EGFR                      CKDEPI eGF     69           mL/min/1.73m2  No Ref Rng                     ANION GAP                      Anion Gap      22      H    7-18 mEq/L      12/28/2023 = 21                    HBA1C                      Hgb A1c        5.5          <6.5%                    FASTING GLUCOSE                      Glucose        92           70-100 mg/dL                               IS SUBJECT FASTING?                      Fasting?       Yes

## 2024-06-27 NOTE — Research (Addendum)
 Are there any labs that are clinically significant?  Yes []  OR No[x]   [name], MD, [facility], FNLA, FACP  [place]  [facility]  Medical Director of the Advanced Lipid Disorders &  Cardiovascular Risk Reduction Clinic Diplomate of the American Board of Clinical Lipidology Attending Cardiologist  Direct Dial: [phone]  Fax: [phone]  Website:  [email or web address]

## 2024-06-27 NOTE — Research (Addendum)
 Are there any labs that are clinically significant?  Yes []  OR No[x]   Please FORWARD back to me with any changes or follow up!   Alyssa KYM Maxcy, MD, [facility], FNLA, FACP  [place]  [facility]  Medical Director of the Advanced Lipid Disorders &  Cardiovascular Risk Reduction Clinic Diplomate of the American Board of Clinical Lipidology Attending Cardiologist  Direct Dial: [phone]  Fax: [phone]  Website:  [email or web address]   COAGULATION GROUP                      APTT           24.7         21.9-29.4 sec                                PT             10.2         9.7-12.3 sec                                 INR            1.0          Patient not taking                                                       oral anticoagulant:                                                      0.8 - 1.2                                                                Patient taking                                                          oral anticoagulant:                                                      2.0 - 3.0                                  ALT & INR                      ALT & INR      To follow  AST & INR                      AST & INR      To follow                                              ALT & INR                      ALT & INR      Criteria not met                                        AST & INR                      AST & INR      Criteria not met

## 2024-06-28 NOTE — Research (Addendum)
 Are there any labs that are clinically significant?  Yes []  OR No[x]   Please FORWARD back to me with any changes or follow up!    Alyssa KYM Maxcy, MD, [facility], FNLA, FACP  [place]  [facility]  Medical Director of the Advanced Lipid Disorders &  Cardiovascular Risk Reduction Clinic Diplomate of the American Board of Clinical Lipidology Attending Cardiologist  Direct Dial: [phone]  Fax: [phone]  Website:  [email or web address]  ACCESSION NO. [id number]                                             Page 1 of 1                                                        INVESTIGATOR: ([id number])                          PROTOCOL   [id number]                     Alyssa Ritter M.D.                             INVESTIGATOR NO.: [id number]                     c/o Reena Lies                              SUBJECT ID: [id number]                     [facility]                 SUBJECT INITIALS NOT COLLECTED:                     [address] [place] [address]                 VISITBETHA PURDUE                     [place], [place]  [id number]                   SPONSOR REPORT TO:                 COLLECTION TIME:09:07 DATE:21-Jun-2024                     Leora Shoe                      DATE RECEIVED IN LABORATORY: 22-Jun-2024                     c/o Sponsor(or Addtl) El[employer].Study DATE REPORTED BY LABORATORY: 28-Jun-2024                     [facility]                          SEX:  F  BIRTHDATE:  01-Mar-1961    AGE: 40b                     [address]                  SELMA NO.: [phone]                   [place], [place]  [phone]                                                                                        Ref. Ranges               Clinical    Comments                                                                          Significance                                                                            Yes*  No                    SM [id number] LPA  COLLECTION D/T                      Coll Date      21-Jun-2024                                               Coll Time      09:07

## 2024-08-09 ENCOUNTER — Other Ambulatory Visit: Payer: Self-pay | Admitting: Cardiology

## 2024-08-09 DIAGNOSIS — I2510 Atherosclerotic heart disease of native coronary artery without angina pectoris: Secondary | ICD-10-CM

## 2024-08-09 DIAGNOSIS — E7800 Pure hypercholesterolemia, unspecified: Secondary | ICD-10-CM

## 2024-08-09 DIAGNOSIS — R5381 Other malaise: Secondary | ICD-10-CM

## 2024-09-04 ENCOUNTER — Encounter

## 2024-09-06 ENCOUNTER — Encounter: Admitting: *Deleted

## 2024-09-06 DIAGNOSIS — Z006 Encounter for examination for normal comparison and control in clinical research program: Secondary | ICD-10-CM

## 2024-09-06 MED ORDER — STUDY - OCEAN(A) - OLPASIRAN (AMG 890) 142 MG/ML OR PLACEBO SQ INJECTION (PI-HILTY)
142.0000 mg | PREFILLED_SYRINGE | Freq: Once | SUBCUTANEOUS | Status: AC
  Administered 2024-09-06: 142 mg via SUBCUTANEOUS
  Filled 2024-09-06: qty 1

## 2024-09-06 NOTE — Research (Signed)
 Alyssa Ritter  Week 108  Vitals: [x]  Experience any AE/SAE/Hospitalizations []  Yes [x]  No  If yes please explain:  Labs collected:  no labs  Discussed with patient about the importance of not letting any one draw cholesterol or lipids. As to we are blinded to results. Reassured patient if we needed to be contacted the study team would reach out to our unblinded person.   ~reminder labs next visit 120 ~  Non-Fatal Potential Endpoint Assessment Yes  No   Has the subject experienced/undergone any of the following since the last visit/contact?   []   [x]    Any Coronary Artery Revascularization/Cerebrovascular Revascularization/ Peripheral Artery Revascularization/Amputation Procedure   []   [x]    Myocardial Infarction []   [x]    Stroke   []   [x]    Provide the date for the non-fatal Potential Endpoints status as of today visit date    []   [x]     IP admin please see MAR (please add Box # to comment section on MAR) [x]  Current Medications[1]      [1]  Current Outpatient Medications:    aspirin  EC 81 MG tablet, Take 81 mg by mouth daily. Swallow whole., Disp: , Rfl:    clonazePAM (KLONOPIN) 1 MG tablet, Take 0.5-1 mg by mouth 2 (two) times daily as needed for anxiety., Disp: , Rfl:    clopidogrel  (PLAVIX ) 75 MG tablet, Take 1 tablet (75 mg total) by mouth daily., Disp: 90 tablet, Rfl: 2   diltiazem  (CARDIZEM  CD) 180 MG 24 hr capsule, Take 1 capsule (180 mg total) by mouth daily., Disp: 90 capsule, Rfl: 2   Evolocumab  (REPATHA  SURECLICK) 140 MG/ML SOAJ, Inject 140 mg into the skin every 14 (fourteen) days., Disp: 6 mL, Rfl: 3   LINZESS 145 MCG CAPS capsule, Take 145 mcg by mouth daily as needed (Constipation)., Disp: , Rfl:    nitroGLYCERIN  (NITROSTAT ) 0.4 MG SL tablet, Place 1 tablet (0.4 mg total) under the tongue every 5 (five) minutes as needed for chest pain., Disp: 30 tablet, Rfl: 0   Study - Alyssa(Ritter) - olpasiran (AMG 890) 142 mg/mL or placebo SQ injection (PI-Hilty), Inject 142 mg into the  skin once. For Investigational Use Only. Inject 1 mL (1 prefilled syringe) subcutaneously into appropriate injection site per protocol every 12 weeks. (Approved injection site(s): upper arm, upper thigh & abdomen). Please contact [facility] for any questions or concerns regarding this medication., Disp: , Rfl:    traZODone  (DESYREL ) 50 MG tablet, Take 50 mg by mouth at bedtime., Disp: , Rfl:

## 2024-11-20 ENCOUNTER — Encounter
